# Patient Record
Sex: Female | Born: 1975
Health system: Southern US, Community
[De-identification: ages and names within clinical notes are randomized; demographics above are authoritative.]

## PROBLEM LIST (undated history)

## (undated) ENCOUNTER — Emergency Department (HOSPITAL_COMMUNITY): Admission: EM | Discharge status: Absent without leave | Payer: Medicare Other

## (undated) DIAGNOSIS — F1410 Cocaine abuse, uncomplicated: Secondary | ICD-10-CM

## (undated) DIAGNOSIS — F329 Major depressive disorder, single episode, unspecified: Secondary | ICD-10-CM

## (undated) DIAGNOSIS — A64 Unspecified sexually transmitted disease: Secondary | ICD-10-CM

## (undated) DIAGNOSIS — F1910 Other psychoactive substance abuse, uncomplicated: Secondary | ICD-10-CM

## (undated) DIAGNOSIS — R569 Unspecified convulsions: Secondary | ICD-10-CM

## (undated) DIAGNOSIS — F32A Depression, unspecified: Secondary | ICD-10-CM

## (undated) HISTORY — DX: Major depressive disorder, single episode, unspecified: F32.9

## (undated) HISTORY — DX: Depression, unspecified: F32.A

## (undated) HISTORY — DX: Other psychoactive substance abuse, uncomplicated: F19.10

## (undated) HISTORY — PX: OTHER SURGICAL HISTORY: SHX169

---

## 1999-02-14 ENCOUNTER — Encounter: Payer: Self-pay | Admitting: Thoracic Surgery (Cardiothoracic Vascular Surgery)

## 1999-02-14 ENCOUNTER — Encounter: Payer: Self-pay | Admitting: Emergency Medicine

## 1999-02-14 ENCOUNTER — Inpatient Hospital Stay (HOSPITAL_COMMUNITY): Admission: EM | Admit: 1999-02-14 | Discharge: 1999-02-18 | Payer: Self-pay | Admitting: Emergency Medicine

## 1999-02-15 ENCOUNTER — Encounter: Payer: Self-pay | Admitting: Thoracic Surgery (Cardiothoracic Vascular Surgery)

## 1999-02-16 ENCOUNTER — Encounter: Payer: Self-pay | Admitting: Thoracic Surgery (Cardiothoracic Vascular Surgery)

## 1999-02-17 ENCOUNTER — Encounter: Payer: Self-pay | Admitting: Thoracic Surgery (Cardiothoracic Vascular Surgery)

## 1999-03-15 ENCOUNTER — Other Ambulatory Visit: Admission: RE | Admit: 1999-03-15 | Discharge: 1999-03-15 | Payer: Self-pay | Admitting: Obstetrics and Gynecology

## 1999-04-12 ENCOUNTER — Encounter: Payer: Self-pay | Admitting: Thoracic Surgery (Cardiothoracic Vascular Surgery)

## 1999-04-12 ENCOUNTER — Encounter
Admission: RE | Admit: 1999-04-12 | Discharge: 1999-04-12 | Payer: Self-pay | Admitting: Thoracic Surgery (Cardiothoracic Vascular Surgery)

## 2000-11-24 ENCOUNTER — Emergency Department (HOSPITAL_COMMUNITY): Admission: EM | Admit: 2000-11-24 | Discharge: 2000-11-24 | Payer: Self-pay | Admitting: Emergency Medicine

## 2001-01-07 ENCOUNTER — Emergency Department (HOSPITAL_COMMUNITY): Admission: EM | Admit: 2001-01-07 | Discharge: 2001-01-07 | Payer: Self-pay | Admitting: Emergency Medicine

## 2001-01-07 ENCOUNTER — Encounter: Payer: Self-pay | Admitting: Emergency Medicine

## 2001-07-09 ENCOUNTER — Inpatient Hospital Stay (HOSPITAL_COMMUNITY): Admission: EM | Admit: 2001-07-09 | Discharge: 2001-07-15 | Payer: Self-pay

## 2001-07-13 ENCOUNTER — Encounter: Payer: Self-pay | Admitting: Family Medicine

## 2001-07-14 ENCOUNTER — Encounter: Payer: Self-pay | Admitting: Family Medicine

## 2001-07-22 ENCOUNTER — Encounter: Admission: RE | Admit: 2001-07-22 | Discharge: 2001-07-22 | Payer: Self-pay | Admitting: Family Medicine

## 2002-07-19 ENCOUNTER — Emergency Department (HOSPITAL_COMMUNITY): Admission: EM | Admit: 2002-07-19 | Discharge: 2002-07-20 | Payer: Self-pay | Admitting: Emergency Medicine

## 2002-07-20 ENCOUNTER — Encounter: Payer: Self-pay | Admitting: Emergency Medicine

## 2002-08-03 ENCOUNTER — Other Ambulatory Visit: Admission: RE | Admit: 2002-08-03 | Discharge: 2002-08-03 | Payer: Self-pay | Admitting: Obstetrics and Gynecology

## 2004-02-07 ENCOUNTER — Other Ambulatory Visit: Admission: RE | Admit: 2004-02-07 | Discharge: 2004-02-07 | Payer: Self-pay | Admitting: Obstetrics and Gynecology

## 2004-03-12 ENCOUNTER — Emergency Department (HOSPITAL_COMMUNITY): Admission: EM | Admit: 2004-03-12 | Discharge: 2004-03-13 | Payer: Self-pay | Admitting: Emergency Medicine

## 2005-06-20 ENCOUNTER — Inpatient Hospital Stay (HOSPITAL_COMMUNITY): Admission: AD | Admit: 2005-06-20 | Discharge: 2005-06-22 | Payer: Self-pay | Admitting: Obstetrics & Gynecology

## 2006-07-08 ENCOUNTER — Ambulatory Visit: Payer: Self-pay | Admitting: *Deleted

## 2006-07-09 ENCOUNTER — Ambulatory Visit (HOSPITAL_COMMUNITY): Admission: RE | Admit: 2006-07-09 | Discharge: 2006-07-09 | Payer: Self-pay | Admitting: Sports Medicine

## 2006-07-13 ENCOUNTER — Inpatient Hospital Stay (HOSPITAL_COMMUNITY): Admission: AD | Admit: 2006-07-13 | Discharge: 2006-07-14 | Payer: Self-pay | Admitting: Gynecology

## 2006-07-13 ENCOUNTER — Ambulatory Visit: Payer: Self-pay | Admitting: *Deleted

## 2008-07-17 ENCOUNTER — Inpatient Hospital Stay (HOSPITAL_COMMUNITY): Admission: AD | Admit: 2008-07-17 | Discharge: 2008-07-18 | Payer: Self-pay | Admitting: Obstetrics & Gynecology

## 2008-07-17 ENCOUNTER — Encounter (HOSPITAL_COMMUNITY): Payer: Self-pay | Admitting: Obstetrics and Gynecology

## 2008-07-17 ENCOUNTER — Ambulatory Visit: Payer: Self-pay | Admitting: Obstetrics and Gynecology

## 2010-10-07 LAB — DIFFERENTIAL
Eosinophils Absolute: 0 10*3/uL (ref 0.0–0.7)
Eosinophils Relative: 0 % (ref 0–5)
Lymphocytes Relative: 10 % — ABNORMAL LOW (ref 12–46)
Lymphs Abs: 1.3 10*3/uL (ref 0.7–4.0)
Monocytes Absolute: 0.6 10*3/uL (ref 0.1–1.0)

## 2010-10-07 LAB — RAPID URINE DRUG SCREEN, HOSP PERFORMED
Amphetamines: NOT DETECTED
Barbiturates: NOT DETECTED
Benzodiazepines: NOT DETECTED
Cocaine: POSITIVE — AB
Opiates: NOT DETECTED
Tetrahydrocannabinol: NOT DETECTED

## 2010-10-07 LAB — CBC
HCT: 34.5 % — ABNORMAL LOW (ref 36.0–46.0)
Hemoglobin: 11.6 g/dL — ABNORMAL LOW (ref 12.0–15.0)
MCHC: 33.5 g/dL (ref 30.0–36.0)
MCV: 86.5 fL (ref 78.0–100.0)
Platelets: 337 10*3/uL (ref 150–400)
RBC: 3.99 MIL/uL (ref 3.87–5.11)
RDW: 12.9 % (ref 11.5–15.5)
WBC: 12.6 10*3/uL — ABNORMAL HIGH (ref 4.0–10.5)

## 2010-10-07 LAB — GC/CHLAMYDIA PROBE AMP, URINE
Chlamydia, Swab/Urine, PCR: POSITIVE — AB
GC Probe Amp, Urine: POSITIVE — AB

## 2010-10-07 LAB — TYPE AND SCREEN: ABO/RH(D): O POS

## 2010-10-07 LAB — RUBELLA SCREEN: Rubella: 105.2 IU/mL — ABNORMAL HIGH

## 2010-11-08 NOTE — Discharge Summary (Signed)
[place]. [facility]  Patient:    Deanna Klein, KOCI Visit Number: [id number] MRN: [id number]          Service Type: MED Location: [phone] Attending Physician:  Sanjuana Letters Dictated by:   Arlis Porta, M.D. Admit Date:  07/09/2001 Discharge Date: 07/15/2001   CC:         [name] A. Dub Mikes, M.D.  Dr. Roxan Hockey   Discharge Summary  DISCHARGE DIAGNOSES: 1. Right upper lobe pneumonia. 2. Crack abuse. 3. History of depression. 4. Chlamydia.  CONSULTANTS:  Dr. Roxan Hockey from infectious disease.  PROCEDURES/PERTINENT LABORATORY DATA:  1. CT scan of the chest showed severe emphysematous disease involving both     upper lobes, right greater than left, with extensive pneumonia involving     the right upper lobe with some air bronchograms and possibly some areas of     necrosis or cavitation.  This could also represent blebs that are spared     from the infection.  2. Hepatic panel showed SGOT of 22, SGPT of 16, alkaline phosphatase of 61,     total bilirubin of 0.4, total protein 5.7, and an albumin of 2.4.  3. BMET at discharge showed a sodium of 139, potassium 4.7, chloride 107, CO2     28, glucose 126, BUN 3, creatinine 0.6, and a calcium of 8.5.  4. Urine GC probe was negative.  CT urine probe was positive.  5. Sputum AFB smears times two were negative.  Cultures are pending for six     weeks.  6. CBC at discharge showed white blood cell count of 15.4, hemoglobin 12.6,     hematocrit 37.4, MCV 85.7, RDW 13.0, and platelet count of 315.  7. RPR was nonreactive.  8. Blood cultures showed no growth.  9. HIV antibody was negative. 10. Urine drug screen showed positive for crack cocaine. 11. Urinalysis showed 40 ketones, small bilirubin, trace blood, 30 protein,     but negative nitrites and moderate leukocytes.  There were many squamous     epithelial cells 7-10 white blood cells, 3-6 red blood cells, with     Trichomonas present as  well.  HOSPITAL COURSE: #1 - PNEUMONIA:  This is a 35 year old white female with a history of crack cocaine abuse as well as a one-pack-year history of smoking times ten years who presented with fevers, chills, and coughing for the past week prior to admission.  She was found to have a right upper lobe infiltrate on chest x-ray.  Given the nature of the location and her risk factors of homelessness and drug abuse, she was admitted.  She was started on IV Rocephin and clindamycin.  TB was ruled out with a negative PPD as well as two negative acid fast bacilli sputum smears.  She was kept on respiratory isolation until TB was ruled out.  Clindamycin was stopped secondary to nausea and vomiting.  She was switched from Rocephin to amoxicillin p.o. 875 mg t.i.d.  She received a total of six days of antibiotics in the hospital and should continue for a full 14-day course.  Doxycycline was later added to double cover for Chlamydia as well as for her pneumonia.  The patient was counseled on smoking cessation as well as cessation of her drug use.  She was given a flu vaccine and a pneumovax vaccine prior to discharge.  At discharge, her lungs sounded clear with good oxygen saturations throughout her hospitalization.  She never required oxygen. Her  fever curve trended downward from a max of 102.6 degrees at admission to a level of 98.8 degrees at discharge.  #2 - CHLAMYDIA:  The patient was double-covered with doxycycline 100 mg p.o. b.i.d. to cover for her Chlamydia as well as for her pneumonia.  Other sexually transmitted diseases were ruled out.  She is negative for HIV.  She is negative for syphilis.  A hepatitis panel, however, was never drawn.  #3 - POSITIVE TRICHOMONAS:  The patient was given a one dose of Flagyl to cover for this.  She was ruled out for other STDs as stated above.  #4 - DEPRESSION:  The patient is currently not on any medications.  She was set up an appointment to see  Dr. Dub Mikes on August 02, 2001 at 2 p.m.  The patient refused any treatment for her drug abuse at this time.  #5 - DRUG ABUSE:  The patient does not want ADS counseling at this time.  She showed no signs of withdrawal during her stay except for possible excessive fatigue.  She should follow up with Dr. Dub Mikes as above.  The patient was counseled on tobacco abuse cessation.  #6 - SOCIAL ISSUES:  The patient was kicked-out of her moms house secondary to using drugs.  However, the mother is a amenable to taking the patient back in at discharge.  DISCHARGE MEDICATIONS: 1. Amoxicillin 875 mg p.o. t.i.d. times seven days. 2. Doxycycline 100 mg p.o. b.i.d. times seven days. 3. Multivitamin one tablet p.o. q.d.  DISCHARGE ACTIVITY:  As tolerated.  DISCHARGE DIET:  No restrictions.  WOUND CARE:  Not applicable.  SPECIAL INSTRUCTIONS:  She is to return to the hospital for worsening shortness of breath, chest pain, fevers, or worsening cough.  FOLLOW-UP APPOINTMENT:  She is to see Dr. Dub Mikes on August 02, 2001 at 2 p.m. She is to call [phone] to make an appointment to see Dr. Renold Don at the [facility] in two weeks. Dictated by:   Arlis Porta, M.D. Attending Physician:  Sanjuana Letters DD:  07/15/01 TD:  07/16/01 Job: [id number]

## 2010-11-08 NOTE — H&P (Signed)
[place]. [facility]  Patient:    CARESSE, SEDIVY Visit Number: [id number] MRN: [id number]          Service Type: MED Location: [phone] Attending Physician:  Sanjuana Letters Dictated by:   Arlis Porta, M.D. Admit Date:  07/09/2001                           History and Physical  CHIEF COMPLAINT: "Sick, aches, pains, cough."  HISTORY OF PRESENT ILLNESS: This is a 35 year old white female, with the only past medical history pneumothorax one to two years ago, who presented to the [facility] Emergency Department with the above complaints lasting for the last week.  She states that her cough has been nonproductive. She denies hemoptysis.  She complains of chest congestion but denies any wheezing or shortness of breath.  She complains of myalgias diffusely.  She denies any pain with inspirations.  Denies chest pain.  She did have some nausea and vomiting yesterday as well as some diarrhea.  She does have sick contacts where she works in [employer].  She has unknown exposure to TB.  REVIEW OF SYSTEMS: She denies any dysuria or hematuria.  She denies any abdominal pain.  She denies any melena or hematochezia.  She does state that she feels like she has lost some weight recently.  PAST MEDICAL HISTORY:  1. Pneumothorax one to two years ago.  2. History of depression, on SSRIs but not currently.  PAST SURGICAL HISTORY: She denies any surgeries.  MEDICATIONS: None.  ALLERGIES (cause rash):  1. DARVOCET.  2. PERCOCET.  SOCIAL HISTORY: She was recently kicked out of her Moms house because she recently relapsed and used crack cocaine, as recently as the day before admission.  She is now currently living in her car.  She denies any alcohol use, past or presently.  She does have tobacco abuse of one pack per day x10 years.  She denies any other drug use.  She works at the [employer].  She does admit to unprotected sex in  the past.  FAMILY HISTORY: Her maternal grandmother died secondary to cancer of unknown origin.  Her Mom and Dad are apparently healthy.  He does have a half-sister that passed away secondary to muscular dystrophy.  The patient has no kids.  PHYSICAL EXAMINATION:  VITAL SIGNS: Temperature 99.1 degrees, respiratory rate 20, pulse 107, blood pressure 93/58.  O2 saturation 96% on room air.  GENERAL: She was alert and oriented x3.  Thin white female in no acute distress.  There was no obviously increased work of breathing.  HEENT: PERRL.  Tympanic membranes clear bilaterally.  Oropharynx without erythema or exudate.  Moist mucous membranes.  NECK: She has small lymphadenopathy of her left posterior cervical chain.  LUNGS: Clear to auscultation bilaterally with decreased breath sounds at the right upper lobe.  Otherwise, good air movement.  CARDIOVASCULAR: Tachycardic, with regular rhythm.  No murmurs.  ABDOMEN: Thin, nontender.  Normoactive bowel sounds.  EXTREMITIES: No edema.  NEUROLOGIC: Good movement of all four extremities without any gross abnormalities.  LABORATORY DATA: CBC showed a WBC of 19.5, hemoglobin 13.7, hematocrit 40.0; platelet count 305,000; 81% neutrophils, 12% lymphocytes, 7% monocytes, 0% eosinophils, 0% basophils.  Complete metabolic panel showed a sodium of 135, potassium 3.5, chloride 100, CO2 26, BUN 5, creatinine 0.7, glucose 102, calcium 8.8, AST 13, ALT 8, alkaline phosphatase 62, total bilirubin  0.4, total protein 7.1, albumin 3.0.  Chest x-ray showed right upper lobe air space disease consistent with pneumonia.  Urine drug screen pending.  Urinalysis pending.  HIV screening test pending as well.  ASSESSMENT/PLAN: This is a 35 year old white female, with right upper lobe pneumonia.  1. Pneumonia.  Given the patients pneumonia in the right upper lobe in an     otherwise healthy female, but with risk factors for tuberculosis and     human  immunodeficiency virus, we must considered Pneumocystis carinii     and tuberculosis along with the usual bugs of Streptococcus pneumoniae,     Mycoplasma, and Haemophilus influenzae.  We will admit her to a negative     pressure respiratory isolation room.  We will place a purified protein     derivative.  We will also check sputum cultures x3 for acid-fast bacilli.     We will check her for human immunodeficiency virus as well.  We will treat     her empirically for now with ceftriaxone and azithromycin.  We will watch     her oxygen saturations and give her oxygen as needed.  Currently she is     saturating well.  2. Social.  Given the patients current situation, being kicked out of her     Moms house, we will have social work consulted to give her     recommendations for homeless shelters.  Given her history of drug abuse     she may help with this issue as well.  The patient also needs a primary     medical doctor.  3. Fluids, electrolytes, and nutrition.  The patient is tolerating p.o.s     currently so we will hold off on intravenous fluids at this point.  We     will watch her electrolytes during her admission and replace as needed. Dictated by:   Arlis Porta, M.D. Attending Physician:  Sanjuana Letters DD:  07/09/01 TD:  07/10/01 Job: [phone]

## 2011-03-31 ENCOUNTER — Emergency Department (HOSPITAL_COMMUNITY): Payer: Medicare Other

## 2011-03-31 ENCOUNTER — Emergency Department (HOSPITAL_COMMUNITY)
Admission: EM | Admit: 2011-03-31 | Discharge: 2011-04-01 | Disposition: A | Discharge status: Home / Self Care | Payer: Medicare Other | Attending: Emergency Medicine | Admitting: Emergency Medicine

## 2011-03-31 DIAGNOSIS — R197 Diarrhea, unspecified: Secondary | ICD-10-CM | POA: Insufficient documentation

## 2011-03-31 DIAGNOSIS — R110 Nausea: Secondary | ICD-10-CM | POA: Insufficient documentation

## 2011-03-31 DIAGNOSIS — O200 Threatened abortion: Secondary | ICD-10-CM | POA: Insufficient documentation

## 2011-03-31 DIAGNOSIS — R10813 Right lower quadrant abdominal tenderness: Secondary | ICD-10-CM | POA: Insufficient documentation

## 2011-03-31 DIAGNOSIS — G40909 Epilepsy, unspecified, not intractable, without status epilepticus: Secondary | ICD-10-CM | POA: Insufficient documentation

## 2011-03-31 DIAGNOSIS — Z79899 Other long term (current) drug therapy: Secondary | ICD-10-CM | POA: Insufficient documentation

## 2011-03-31 DIAGNOSIS — N898 Other specified noninflammatory disorders of vagina: Secondary | ICD-10-CM | POA: Insufficient documentation

## 2011-03-31 DIAGNOSIS — R1031 Right lower quadrant pain: Secondary | ICD-10-CM | POA: Insufficient documentation

## 2011-03-31 DIAGNOSIS — N949 Unspecified condition associated with female genital organs and menstrual cycle: Secondary | ICD-10-CM | POA: Insufficient documentation

## 2011-03-31 LAB — URINE MICROSCOPIC-ADD ON

## 2011-03-31 LAB — URINALYSIS, ROUTINE W REFLEX MICROSCOPIC
Bilirubin Urine: NEGATIVE
Ketones, ur: 15 mg/dL — AB
Nitrite: NEGATIVE
Protein, ur: NEGATIVE mg/dL
Urobilinogen, UA: 1 mg/dL (ref 0.0–1.0)

## 2011-03-31 LAB — POCT I-STAT TROPONIN I: Troponin i, poc: 0 ng/mL (ref 0.00–0.08)

## 2011-03-31 LAB — HCG, QUANTITATIVE, PREGNANCY: hCG, Beta Chain, Quant, S: 1005 m[IU]/mL — ABNORMAL HIGH (ref <5)

## 2011-03-31 LAB — POCT PREGNANCY, URINE: Preg Test, Ur: POSITIVE

## 2011-04-01 LAB — POCT I-STAT, CHEM 8
Creatinine, Ser: 0.8 mg/dL (ref 0.50–1.10)
HCT: 33 % — ABNORMAL LOW (ref 36.0–46.0)
Hemoglobin: 11.2 g/dL — ABNORMAL LOW (ref 12.0–15.0)
Sodium: 140 mEq/L (ref 135–145)
TCO2: 24 mmol/L (ref 0–100)

## 2011-04-01 LAB — WET PREP, GENITAL: Trich, Wet Prep: NONE SEEN

## 2011-04-02 LAB — GC/CHLAMYDIA PROBE AMP, GENITAL: GC Probe Amp, Genital: NEGATIVE

## 2011-05-30 ENCOUNTER — Other Ambulatory Visit (HOSPITAL_COMMUNITY)
Admission: RE | Admit: 2011-05-30 | Discharge: 2011-05-30 | Disposition: A | Discharge status: Home / Self Care | Payer: Medicare Other | Source: Ambulatory Visit | Attending: Obstetrics & Gynecology | Admitting: Obstetrics & Gynecology

## 2011-05-30 ENCOUNTER — Ambulatory Visit (INDEPENDENT_AMBULATORY_CARE_PROVIDER_SITE_OTHER): Payer: Medicaid Other | Admitting: Obstetrics & Gynecology

## 2011-05-30 ENCOUNTER — Other Ambulatory Visit: Payer: Self-pay | Admitting: Obstetrics & Gynecology

## 2011-05-30 VITALS — BP 97/70 | HR 81 | Temp 98.6°F | Ht 63.0 in | Wt 114.4 lb

## 2011-05-30 DIAGNOSIS — Z23 Encounter for immunization: Secondary | ICD-10-CM

## 2011-05-30 DIAGNOSIS — N83209 Unspecified ovarian cyst, unspecified side: Secondary | ICD-10-CM

## 2011-05-30 DIAGNOSIS — Z01419 Encounter for gynecological examination (general) (routine) without abnormal findings: Secondary | ICD-10-CM | POA: Insufficient documentation

## 2011-05-30 DIAGNOSIS — Z113 Encounter for screening for infections with a predominantly sexual mode of transmission: Secondary | ICD-10-CM | POA: Insufficient documentation

## 2011-05-30 MED ORDER — INFLUENZA VIRUS VACC SPLIT PF IM SUSP
0.5000 mL | Freq: Once | INTRAMUSCULAR | Status: AC
  Administered 2011-05-30: 0.5 mL via INTRAMUSCULAR

## 2011-05-30 NOTE — Patient Instructions (Signed)
Sterilization, Women Sterilization is a surgical procedure. This surgery permanently prevents pregnancy in women. This can be done by tying (with or without cutting) the fallopian tubes or burning the tubes closed (tubal ligation). Tubal ligation blocks the tubes and prevents the egg from being fertilized by the sperm. Sterilization can be done by removing the ovaries that produce the egg (castration) as well. Sterilization is considered safe with very rare complications. It does not affect menstrual periods, sexual desire, or performance.  Since sterilization is considered permanent, you should not do it until you are sure you do not want to have more children. You and your partner should fully agree to have the procedure. Your decision to have the procedure should not be made when you are in a stressful situation. This can include a loss of a pregnancy, illness or death of a spouse, or divorce. There are other means of preventing unwanted pregnancies that can be used until you are completely sure you want to be sterilized. Sterilization does not protect against sexually transmitted disease. Women who had a sterilization procedure and want it reversed must know that it requires an expensive and major operation. The reversal may not be successful and has a high rate of tubal (ectopic) pregnancy that can be dangerous and require surgery. There are several ways to perform a tubal sterlization:  Laparoscopy. The abdomen is filled with a gas to see the pelvic organs. Then, a tube with a light attached is inserted into the abdomen through 2 small incisions. The fallopian tubes are blocked with a ring, clip or electrocautery to burn closed the tubes. Then, the gas is released and the small incisions are closed.   Hysteroscopy. A tube with a light is inserted in the vagina, through the cervix and then into the uterus. A spring-like instrument is inserted into the opening of the fallopian tubes. The spring causes  scaring and blocks the tubes. Other forms of contraception should be used for three months at which time an X-ray is done to be sure the tubes are blocked.   Minilaparotomy. This is done right after giving birth. A small incision is made under the belly button and the tubes are exposed. The tubes can then be burned, tied and/or cut.   Tubal ligation can be done during a Cesarean section.   Castration is a surgical procedure that removes both ovaries.  Tubal sterilization should be discussed with your caregiver to answer any concerns you or your partner might have. This meeting will help to decide for sure if the operation is safe for you and which procedure is the best one for you. You can change your mind and cancel the surgery at any time. HOME CARE INSTRUCTIONS   Follow your caregivers instructions regarding diet, rest, work, social and sexual activities and follow up appointments.   Shoulder pain is common following a laparoscopy. The pain may be relieved by lying down flat.   Only take over-the-counter or prescription medicines for pain, discomfort or fever as directed by your caregiver.   You may use lozenges for throat discomfort.   Keep the incisions covered to prevent infection.  SEEK IMMEDIATE MEDICAL CARE IF:   You develop a temperature of 102 F (38.9 C), or as your caregiver suggests.   You become dizzy or faint.   You start to feel sick to your stomach (nausea) or throw up (vomit).   You develop abdominal pain not relieved with over-the-counter medications.   You have redness and puffiness (  swelling) of the cut (incision).   You see pus draining from the incision.   You miss a menstrual period.  Document Released: 11/26/2007 Document Revised: 02/19/2011 Document Reviewed: 11/26/2007 Exit[employer] Patient Information 2012 [employer]. 

## 2011-05-30 NOTE — Progress Notes (Signed)
  Subjective:    Patient ID: Deanna Klein, female    DOB: 28-May-1976, 35 y.o.   MRN: [id number]  [id number] LMP 05/10/11 Ms. Climer was seen at [facility] emergency room on 04/01/2011. She had vaginal bleeding and a positive pregnancy test with a beta hCG 1000. Since then she had heavy bleeding consistent with a miscarriage. She saw Dr. Richardson Dopp 2 weeks later. Ultrasound showed complete resolution of the miscarriage. Patient states that a right ovarian cyst was seen. [facility] does not accept Medicaid. She was referred here for followup of the cyst. I don't have a copy of that record. No complaints of pain. She requests sterilization. Currently she uses condoms for birth control. She has had 3 children and they are all adopted out she says. Past Medical History  Diagnosis Date  . Depression   . Substance abuse     crack cocaine   . Past Surgical History  Procedure Date  . Right lung collapse    Scheduled Meds:   Continuous Infusions:   PRN Meds:.   Current outpatient prescriptions:ARIPiprazole (ABILIFY) 10 MG tablet, Take 10 mg by mouth daily.  , Disp: , Rfl: ;  divalproex (DEPAKOTE) 500 MG DR tablet, Take 500 mg by mouth daily.  , Disp: , Rfl:  Allergies  Allergen Reactions  . Darvocet (Propoxyphene N-Acetaminophen) Hives  . Percocet (Oxycodone-Acetaminophen) Hives   History   Social History  . Marital Status: Single    Spouse Name: N/A    Number of Children: N/A  . Years of Education: N/A   Occupational History  . Not on file.   Social History Main Topics  . Smoking status: Current Everyday Smoker -- 1.0 packs/day for 19 years    Types: Cigarettes  . Smokeless tobacco: Never Used  . Alcohol Use: No  . Drug Use: Yes    Special: "Crack" cocaine     crack-couple months sober  . Sexually Active: Not Currently -- Female partner(s)    Birth Control/ Protection: None   Other Topics Concern  . Not on file   Social History Narrative  . No narrative on file   History  reviewed. No pertinent family history.     Review of Systems no vaginal bleeding. No significant vaginal discharge or abdominal pain.     Objective:   Physical Exam  Filed Vitals:   05/30/11 1059  Height: 5\' 3"  (1.6 m)  Weight: 114 lb 6.4 oz (51.891 kg)   Filed Vitals:   05/30/11 1059  BP: 97/70  Pulse: 81  Temp: 98.6 F (37 C)  TempSrc: Oral  Height: 5\' 3"  (1.6 m)  Weight: 114 lb 6.4 oz (51.891 kg)   Patient is in no acute distress. She has a flat affect  Abdomen: Nondistended flat soft nontender no mass Pelvic: External genitalia vagina and cervix appeared normal with scant mucous discharge. A Pap smear was obtained. STD testing was ordered. Uterus is normal size nontender no adnexal masses or tenderness.      Assessment & Plan:  Recent miscarriage currently resolved. Patient states that an ovarian cyst was seen on ultrasound in October. She requests permanent sterilization. I will order a pelvic ultrasound in followup for the ovarian cyst. She'll return in 2 weeks. She did sign Medicaid papers for sterilization. She can be scheduled for laparoscopic sterilization when she returns. Dr. Scheryl Darter 05/30/2011

## 2011-05-30 NOTE — Progress Notes (Signed)
Pt does not remember when her last period.

## 2011-06-04 ENCOUNTER — Ambulatory Visit (HOSPITAL_COMMUNITY): Payer: Medicaid Other

## 2011-06-05 ENCOUNTER — Ambulatory Visit (HOSPITAL_COMMUNITY)
Admission: RE | Admit: 2011-06-05 | Discharge: 2011-06-05 | Disposition: A | Discharge status: Home / Self Care | Payer: Medicare Other | Source: Ambulatory Visit | Attending: Obstetrics & Gynecology | Admitting: Obstetrics & Gynecology

## 2011-06-05 DIAGNOSIS — N83209 Unspecified ovarian cyst, unspecified side: Secondary | ICD-10-CM | POA: Insufficient documentation

## 2013-03-06 ENCOUNTER — Encounter (HOSPITAL_COMMUNITY): Payer: Self-pay | Admitting: Emergency Medicine

## 2013-03-06 ENCOUNTER — Emergency Department (HOSPITAL_COMMUNITY): Payer: Medicare Other

## 2013-03-06 ENCOUNTER — Inpatient Hospital Stay (HOSPITAL_COMMUNITY)
Admission: EM | Admit: 2013-03-06 | Discharge: 2013-03-10 | DRG: 199 | Disposition: A | Discharge status: Home / Self Care | Payer: Medicare Other | Attending: Internal Medicine | Admitting: Internal Medicine

## 2013-03-06 DIAGNOSIS — F3289 Other specified depressive episodes: Secondary | ICD-10-CM | POA: Diagnosis present

## 2013-03-06 DIAGNOSIS — Z681 Body mass index (BMI) 19 or less, adult: Secondary | ICD-10-CM

## 2013-03-06 DIAGNOSIS — E43 Unspecified severe protein-calorie malnutrition: Secondary | ICD-10-CM | POA: Insufficient documentation

## 2013-03-06 DIAGNOSIS — F17200 Nicotine dependence, unspecified, uncomplicated: Secondary | ICD-10-CM | POA: Diagnosis present

## 2013-03-06 DIAGNOSIS — Z79899 Other long term (current) drug therapy: Secondary | ICD-10-CM

## 2013-03-06 DIAGNOSIS — J939 Pneumothorax, unspecified: Secondary | ICD-10-CM | POA: Diagnosis present

## 2013-03-06 DIAGNOSIS — R7309 Other abnormal glucose: Secondary | ICD-10-CM | POA: Diagnosis not present

## 2013-03-06 DIAGNOSIS — IMO0001 Reserved for inherently not codable concepts without codable children: Secondary | ICD-10-CM | POA: Diagnosis present

## 2013-03-06 DIAGNOSIS — F329 Major depressive disorder, single episode, unspecified: Secondary | ICD-10-CM | POA: Diagnosis present

## 2013-03-06 DIAGNOSIS — J9383 Other pneumothorax: Principal | ICD-10-CM | POA: Diagnosis present

## 2013-03-06 DIAGNOSIS — J930 Spontaneous tension pneumothorax: Secondary | ICD-10-CM

## 2013-03-06 DIAGNOSIS — F1410 Cocaine abuse, uncomplicated: Secondary | ICD-10-CM | POA: Diagnosis present

## 2013-03-06 LAB — PRO B NATRIURETIC PEPTIDE: Pro B Natriuretic peptide (BNP): 63.7 pg/mL (ref 0–125)

## 2013-03-06 LAB — CBC
MCH: 30.8 pg (ref 26.0–34.0)
MCHC: 35.5 g/dL (ref 30.0–36.0)
MCV: 87 fL (ref 78.0–100.0)
Platelets: 253 10*3/uL (ref 150–400)
RBC: 5.22 MIL/uL — ABNORMAL HIGH (ref 3.87–5.11)

## 2013-03-06 LAB — BASIC METABOLIC PANEL
CO2: 20 mEq/L (ref 19–32)
Calcium: 9.8 mg/dL (ref 8.4–10.5)
Creatinine, Ser: 0.86 mg/dL (ref 0.50–1.10)
GFR calc non Af Amer: 84 mL/min — ABNORMAL LOW (ref >90)

## 2013-03-06 MED ORDER — NICOTINE 14 MG/24HR TD PT24
14.0000 mg | MEDICATED_PATCH | Freq: Every day | TRANSDERMAL | Status: DC
  Administered 2013-03-07 – 2013-03-10 (×5): 14 mg via TRANSDERMAL
  Filled 2013-03-06 (×8): qty 1

## 2013-03-06 MED ORDER — SODIUM CHLORIDE 0.9 % IJ SOLN
3.0000 mL | Freq: Two times a day (BID) | INTRAMUSCULAR | Status: DC
  Administered 2013-03-08 – 2013-03-10 (×5): 3 mL via INTRAVENOUS

## 2013-03-06 MED ORDER — MIDAZOLAM HCL 2 MG/2ML IJ SOLN
INTRAMUSCULAR | Status: AC
  Filled 2013-03-06: qty 4

## 2013-03-06 MED ORDER — ARIPIPRAZOLE 10 MG PO TABS
10.0000 mg | ORAL_TABLET | Freq: Every day | ORAL | Status: DC
  Administered 2013-03-06 – 2013-03-09 (×4): 10 mg via ORAL
  Filled 2013-03-06 (×7): qty 1

## 2013-03-06 MED ORDER — FENTANYL CITRATE 0.05 MG/ML IJ SOLN
50.0000 ug | Freq: Once | INTRAMUSCULAR | Status: AC
  Administered 2013-03-06: 50 ug via INTRAVENOUS

## 2013-03-06 MED ORDER — PNEUMOCOCCAL VAC POLYVALENT 25 MCG/0.5ML IJ INJ
0.5000 mL | INJECTION | INTRAMUSCULAR | Status: AC
  Filled 2013-03-06 (×2): qty 0.5

## 2013-03-06 MED ORDER — LIDOCAINE 5 % EX PTCH
2.0000 | MEDICATED_PATCH | CUTANEOUS | Status: DC
  Administered 2013-03-07 – 2013-03-09 (×6): 2 via TRANSDERMAL
  Filled 2013-03-06 (×7): qty 2

## 2013-03-06 MED ORDER — SODIUM CHLORIDE 0.9 % IV SOLN
INTRAVENOUS | Status: DC
  Administered 2013-03-06: 22:00:00 via INTRAVENOUS
  Administered 2013-03-07: 50 mL/h via INTRAVENOUS

## 2013-03-06 MED ORDER — ONDANSETRON HCL 4 MG/2ML IJ SOLN: 4.0000 mg | Freq: Four times a day (QID) | INTRAMUSCULAR | Status: DC | PRN

## 2013-03-06 MED ORDER — FENTANYL CITRATE 0.05 MG/ML IJ SOLN
50.0000 ug | Freq: Once | INTRAMUSCULAR | Status: AC
  Administered 2013-03-06: 50 ug via INTRAVENOUS
  Filled 2013-03-06: qty 2

## 2013-03-06 MED ORDER — SODIUM CHLORIDE 0.9 % IV BOLUS (SEPSIS)
1000.0000 mL | Freq: Once | INTRAVENOUS | Status: AC
  Administered 2013-03-06: 1000 mL via INTRAVENOUS

## 2013-03-06 MED ORDER — INFLUENZA VAC SPLIT QUAD 0.5 ML IM SUSP
0.5000 mL | INTRAMUSCULAR | Status: AC
  Filled 2013-03-06 (×2): qty 0.5

## 2013-03-06 MED ORDER — ONDANSETRON HCL 4 MG PO TABS: 4.0000 mg | ORAL_TABLET | Freq: Four times a day (QID) | ORAL | Status: DC | PRN

## 2013-03-06 MED ORDER — PANTOPRAZOLE SODIUM 40 MG PO TBEC
40.0000 mg | DELAYED_RELEASE_TABLET | Freq: Every day | ORAL | Status: DC
  Administered 2013-03-06 – 2013-03-10 (×5): 40 mg via ORAL
  Filled 2013-03-06 (×5): qty 1

## 2013-03-06 MED ORDER — HEPARIN SODIUM (PORCINE) 5000 UNIT/ML IJ SOLN
5000.0000 [IU] | Freq: Three times a day (TID) | INTRAMUSCULAR | Status: DC
  Administered 2013-03-06 – 2013-03-10 (×12): 5000 [IU] via SUBCUTANEOUS
  Filled 2013-03-06 (×16): qty 1

## 2013-03-06 MED ORDER — HYDROCOD POLST-CHLORPHEN POLST 10-8 MG/5ML PO LQCR: 5.0000 mL | Freq: Two times a day (BID) | ORAL | Status: DC | PRN

## 2013-03-06 MED ORDER — OXYCODONE HCL 5 MG PO TABS: 5.0000 mg | ORAL_TABLET | ORAL | Status: DC | PRN

## 2013-03-06 MED ORDER — DIVALPROEX SODIUM 500 MG PO DR TAB
500.0000 mg | DELAYED_RELEASE_TABLET | Freq: Every day | ORAL | Status: DC
  Administered 2013-03-06 – 2013-03-10 (×5): 500 mg via ORAL
  Filled 2013-03-06 (×6): qty 1

## 2013-03-06 MED ORDER — MORPHINE SULFATE 2 MG/ML IJ SOLN
2.0000 mg | INTRAMUSCULAR | Status: DC | PRN
  Administered 2013-03-06: 2 mg via INTRAVENOUS
  Filled 2013-03-06: qty 1

## 2013-03-06 MED ORDER — LORAZEPAM 0.5 MG PO TABS: 0.5000 mg | ORAL_TABLET | ORAL | Status: DC | PRN

## 2013-03-06 MED ORDER — HYDROMORPHONE HCL PF 1 MG/ML IJ SOLN
0.5000 mg | INTRAMUSCULAR | Status: DC | PRN
  Administered 2013-03-07 (×3): 1 mg via INTRAVENOUS
  Administered 2013-03-08: 0.5 mg via INTRAVENOUS
  Administered 2013-03-08: 1 mg via INTRAVENOUS
  Administered 2013-03-08: 0.5 mg via INTRAVENOUS
  Filled 2013-03-06 (×7): qty 1

## 2013-03-06 NOTE — ED Notes (Signed)
C/o L shoulder blade pain since last night that radiates to L chest with sob.  Denies nausea and vomiting.  O2 sats 86% on room air and HR 140s.  Reports productive cough with white sputum.

## 2013-03-06 NOTE — H&P (Signed)
[facility] History and Physical  Patient: Deanna Klein  WUJ:[id number]  DOB: [birth year]  DOA: 03/06/2013  Referring physician: Dr. Criss Alvine PCP: No PCP Per Patient  Consults: Treatment Team:  Delight Ovens, MD   Chief Complaint: Left-sided chest pain  HPI: Deanna Klein is a 37 y.o. female with Past medical history of depression and cocaine abuse an active smoker. She presented with the complaint of left-sided chest pain that started yesterday. She mentioned that she was at her house sitting in a chair and then suddenly started having chest pain on the left side which was stabbing in nature and sharp nonradiating. The chest pain eased up and then she went to sleep and when she woke up in the morning she started having continuous chest pain which progressively worsened throughout the day. She does mentions that she has history of right-sided pneumothorax in the past. She denies any fall, trauma, injury. She does she that she has dry cough, but denies any sputum expectoration. She denies any fever but does mention about chills that happened yesterday. She denies any other symptoms of nausea, vomiting, abdominal pain, diarrhea, urinary symptoms, leg swelling.  Review of Systems: as mentioned in the history of present illness.  A Comprehensive review of the other systems is negative.  Past Medical History  Diagnosis Date  . Depression   . Substance abuse     crack cocaine   Past Surgical History  Procedure Laterality Date  . Right lung collapse     Social History:  reports that she has been smoking Cigarettes.  She has a 19 pack-year smoking history. She has never used smokeless tobacco. She reports that she uses illicit drugs ("Crack" cocaine and Cocaine). She reports that she does not drink alcohol. Patient is coming from home. Independent for most of her  ADL.  Allergies  Allergen Reactions  . Darvocet [Propoxyphene-Acetaminophen] Hives  . Percocet  [Oxycodone-Acetaminophen] Hives    No family history on file.  Prior to Admission medications   Medication Sig Start Date End Date Taking? Authorizing Provider  ARIPiprazole (ABILIFY) 10 MG tablet Take 10 mg by mouth at bedtime.    Yes Historical Provider, MD  Ascorbic Acid (VITAMIN C PO) Take 1 tablet by mouth daily.   Yes Historical Provider, MD  divalproex (DEPAKOTE) 500 MG DR tablet Take 500 mg by mouth daily.    Yes Historical Provider, MD  Potassium 99 MG TABS Take 99 mg by mouth daily.   Yes Historical Provider, MD    Physical Exam: Filed Vitals:   03/06/13 1930 03/06/13 1945 03/06/13 2000 03/06/13 2015  BP: 111/74 117/88 112/85 133/83  Pulse: 114 100 103 97  Temp:      TempSrc:      Resp: 21 15 29 23   SpO2: 97% 97% 100% 100%    General: Alert, Awake and Oriented to Time, Place and Person. Appear in marked distress Eyes: PERRL ENT: Oral Mucosa clear dry. Neck: No JVD, no Carotid Bruits  Cardiovascular: S1 and S2 Present, no Murmur, Peripheral Pulses Present Respiratory: Chest tube inserted on the left side, Bilateral Air entry equal and Decreased, Clear to Auscultation, no crepitations felt No Crackles, no wheezes Abdomen: Bowel Sound Present, Soft and Non tender Skin: No Rash Extremities: No Pedal edema, no calf tenderness Neurologic: Grossly Unremarkable.  Labs on Admission:  CBC:  Recent Labs Lab 03/06/13 1911  WBC 11.8*  HGB 16.1*  HCT 45.4  MCV 87.0  PLT 253    CMP  Component Value Date/Time   NA 134* 03/06/2013 1911   K 4.1 03/06/2013 1911   CL 100 03/06/2013 1911   CO2 20 03/06/2013 1911   GLUCOSE 130* 03/06/2013 1911   BUN 12 03/06/2013 1911   CREATININE 0.86 03/06/2013 1911   CALCIUM 9.8 03/06/2013 1911   GFRNONAA 84* 03/06/2013 1911   GFRAA >90 03/06/2013 1911    No results found for this basename: LIPASE, AMYLASE,  in the last 168 hours No results found for this basename: AMMONIA,  in the last 168 hours  Cardiac Enzymes: No results found  for this basename: CKTOTAL, CKMB, CKMBINDEX, TROPONINI,  in the last 168 hours  BNP (last 3 results)  Recent Labs  03/06/13 1911  PROBNP 63.7    Radiological Exams on Admission: Dg Chest Port 1 View  03/06/2013   *RADIOLOGY REPORT*  Clinical Data: Chest tube insertion  PORTABLE CHEST - 1 VIEW  Comparison: Same date.  Findings: There has been interval placement of left-sided chest tube with tip projected over the left upper lobe.  Resolution of previously noted pneumothorax is noted.  Right lung is clear. Scarring is noted in both lower lobes.  Cardiomediastinal silhouette is noted.  Small amount of subcutaneous emphysema is seen over left lateral chest wall noted.  IMPRESSION: Interval placement of left-sided chest tube with resolution of the pneumothorax noted on prior exam.   Original Report Authenticated By: Lupita Raider.,  M.D.   Dg Chest Port 1 View  03/06/2013   CLINICAL DATA:  Cough, shortness of breath and chest pain. Fever and chills.  EXAM: PORTABLE CHEST - 1 VIEW  COMPARISON:  No priors.  FINDINGS: Large left-sided pneumothorax, occupying approximately 50% or more of the left hemithorax volume. At this time, there is no definite left to right shift of the cardiomediastinal structures to strongly suggest a tension pneumothorax. Mild diffuse peribronchial cuffing. No acute consolidative airspace disease. No pleural effusions. Heart size is normal.  IMPRESSION: 1. Large left-sided pneumothorax, as above. No definite signs of tension pneumothorax at this time. 2. Diffuse peribronchial cuffing may suggest an underlying bronchitis. CriticalValue/emergent results were called by telephone at the time of interpretation on 03/06/2013 at 7:50 PMto Dr. Criss Alvine, who verbally acknowledged these results.   Electronically Signed   By: Trudie Reed M.D.   On: 03/06/2013 19:51    Assessment/Plan Principal Problem:   Pneumothorax on left Active Problems:   Cocaine abuse   Smoking   1.  Pneumothorax on left The patient presented with left-sided pleuritic chest pain and underwent an x-ray in the ED which doest show pneumothorax. From her prior history of right-sided pneumothorax she might have spontaneous pneumothorax at present as well.  Chest tube has already been inserted and cardiothoracic surgery has been consulted. At present she has bilateral breath sounds, and repeat x-ray after the chest tube does show significant improvement in the aeration of the lung. At present I would admit her to the step down unit. Cardiovascular surgery will be following the chest tube. Pain control will be provided with Lidoderm patch and IV narcotics. As needed Zofran.  2. History of cocaine abuse Counseling provided to quit. Patient placed on lorazepam as needed for anxiety.  3. History of smoking abuse Nicotine patch applied. Patient counseled about quitting.  DVT Prophylaxis: subcutaneous Heparin Nutrition: As tolerated  Code Status: Full  Disposition: Admitted to inpatient in step-down unit.  Author: Lynden Oxford, MD [facility] Pager: [phone] 03/06/2013, 9:11 PM    If 7PM-7AM,  please contact night-coverage [email or web address] Password [email or web address]

## 2013-03-06 NOTE — Consult Note (Signed)
[address]       [place] [address]             [phone]        Deanna Klein [facility] Medical Record #[id number] Date of Birth: [birth year]  Referring: ER Primary Care: No primary provider on file.  Chief Complaint:    Chief Complaint  Patient presents with  . Chest Pain  . Shortness of Breath    History of Present Illness:     37 yo female with previous history of Rt PTX in 2003. Was using cocaine last pm and developed left shoulder pain which persisted today with left chest pain and sob Presented to ER with respiratory difficulty. ER  Staff placed left chest tube with expansion of lung on follow up chest xray   Current Activity/ Functional Status: Patient is independent with mobility/ambulation, transfers, ADL's, IADL's.   Zubrod Score: At the time of surgery this patient's most appropriate activity status/level should be described as: []  Normal activity, no symptoms [x]  Symptoms, fully ambulatory []  Symptoms, in bed less than or equal to 50% of the time []  Symptoms, in bed greater than 50% of the time but less than 100% []  Bedridden []  Moribund  Past Medical History  Diagnosis Date  . Depression   . Substance abuse     crack cocaine    Past Surgical History  Procedure Laterality Date  . Right lung collapse  2003    History  Smoking status  . Current Every Day Smoker -- 1.00 packs/day for 19 years  . Types: Cigarettes  Smokeless tobacco  . Never Used   History  Alcohol Use No    History   Social History  . Marital Status: Single    Spouse Name: N/A    Number of Children: N/A  . Years of Education: N/A   Occupational History  . Not on file.   Social History Main Topics  . Smoking status: Current Every Day Smoker -- 1.00 packs/day for 19 years    Types: Cigarettes  . Smokeless tobacco: Never Used  . Alcohol Use: No  . Drug Use: Yes    Special: "Crack" cocaine, Cocaine     Comment: crack-couple months sober    . Sexual Activity: Not Currently    Partners: Male    Birth Control/ Protection: None   Other Topics Concern  . Not on file   Social History Narrative  . No narrative on file    Allergies  Allergen Reactions  . Darvocet [Propoxyphene-Acetaminophen] Hives  . Percocet [Oxycodone-Acetaminophen] Hives    Current Facility-Administered Medications  Medication Dose Route Frequency Provider Last Rate Last Dose  . fentaNYL (SUBLIMAZE) injection 50 mcg  50 mcg Intravenous Once Audree Camel, MD      . midazolam (VERSED) 2 MG/2ML injection           . sodium chloride 0.9 % bolus 1,000 mL  1,000 mL Intravenous Once Audree Camel, MD       Current Outpatient Prescriptions  Medication Sig Dispense Refill  . ARIPiprazole (ABILIFY) 10 MG tablet Take 10 mg by mouth daily.        . divalproex (DEPAKOTE) 500 MG DR tablet Take 500 mg by mouth daily.           No family history on file.   Review of Systems:     Cardiac Review of Systems: Y or N  Chest Pain [ y   ]  Resting SOB [  y ] Exertional SOB  Cove.Etienne  ]  Orthopnea Cove.Etienne  ]   Pedal Edema [   ]    Palpitations [  ] Syncope  [  ]   Presyncope [   ]  General Review of Systems: [Y] = yes [  ]=no Constitional: recent weight change [  ]; anorexia [  ]; fatigue [  ]; nausea [  ]; night sweats [  ]; fever [  ]; or chills [  ]                                                               Dental: poor dentition[y  ]; Last Dentist visit: unknown  Eye : blurred vision [  ]; diplopia [   ]; vision changes [  ];  Amaurosis fugax[  ]; Resp: cough [  ];  wheezing[  ];  hemoptysis[  ]; shortness of breath[  ]; paroxysmal nocturnal dyspnea[  ]; dyspnea on exertion[  ]; or orthopnea[  ];  GI:  gallstones[  ], vomiting[  ];  dysphagia[  ]; melena[  ];  hematochezia [  ]; heartburn[  ];   Hx of  Colonoscopy[  ]; GU: kidney stones [  ]; hematuria[  ];   dysuria [  ];  nocturia[  ];  history of     obstruction [  ]; urinary frequency [  ]             Skin:  rash, swelling[  ];, hair loss[  ];  peripheral edema[  ];  or itching[  ]; Musculosketetal: myalgias[  ];  joint swelling[  ];  joint erythema[  ];  joint pain[  ];  back pain[  ];  Heme/Lymph: bruising[  ];  bleeding[  ];  anemia[  ];  Neuro: TIA[  ];  headaches[  ];  stroke[  ];  vertigo[  ];  seizures[  ];   paresthesias[  ];  difficulty walking[  ];  Psych:depression[ y ]; anxiety[ y ];  Endocrine: diabetes[  n];  thyroid dysfunction[  ];  Immunizations: Flu [ n ]; Pneumococcal[n  ];  Other:  Physical Exam: BP 189/161  Pulse 144  Temp(Src) 98.8 F (37.1 C) (Oral)  Resp 24  SpO2 86%  LMP 01/21/2013  General appearance: alert, cachectic, combative and mild distress Neurologic: intact Heart: regular rate and rhythm, S1, S2 normal, no murmur, click, rub or gallop and no click Lungs: diminished breath sounds LLL and LUL Abdomen: soft, non-tender; bowel sounds normal; no masses,  no organomegaly Extremities: extremities normal, atraumatic, no cyanosis or edema and Homans sign is negative, no sign of DVT Poor hygiene, has not been caring for self  Diagnostic Studies & Laboratory data:     Recent Radiology Findings:  Follow up chest xray after ct placement lung inflated with no air leak    Dg Chest Port 1 View  03/06/2013   CLINICAL DATA:  Cough, shortness of breath and chest pain. Fever and chills.  EXAM: PORTABLE CHEST - 1 VIEW  COMPARISON:  No priors.  FINDINGS: Large left-sided pneumothorax, occupying approximately 50% or more of the left hemithorax volume. At this time, there is no definite left to right shift of the cardiomediastinal structures to strongly  suggest a tension pneumothorax. Mild diffuse peribronchial cuffing. No acute consolidative airspace disease. No pleural effusions. Heart size is normal.  IMPRESSION: 1. Large left-sided pneumothorax, as above. No definite signs of tension pneumothorax at this time. 2. Diffuse peribronchial cuffing may suggest an underlying  bronchitis. CriticalValue/emergent results were called by telephone at the time of interpretation on 03/06/2013 at 7:50 PMto Dr. Criss Alvine, who verbally acknowledged these results.   Electronically Signed   By: Trudie Reed M.D.   On: 03/06/2013 19:51      Recent Lab Findings: Lab Results  Component Value Date   WBC 11.8* 03/06/2013   HGB 16.1* 03/06/2013   HCT 45.4 03/06/2013   PLT 253 03/06/2013   GLUCOSE 86 04/01/2011   NA 140 04/01/2011   K 3.4* 04/01/2011   CL 105 04/01/2011   CREATININE 0.80 04/01/2011   BUN 14 04/01/2011      Assessment / Plan:   Place ct on suction for 24 hours, to water seal tomorrow if no air leak Medical service to admit treat underlying addiction issues and make appropriate  Referrals and monitor for withdrawal   Delight Ovens MD      [address] [place] [address] Office [phone]   Beeper [phone]  03/06/2013 7:55 PM

## 2013-03-06 NOTE — ED Notes (Signed)
Chest tube insertion completed by Dr. Criss Alvine.  Bilateral breath sounds present; symmetrical chest expansion.  Dressing placed by EDP and Cardiothoracic surgeon.

## 2013-03-06 NOTE — ED Provider Notes (Signed)
CSN: [id number]     Arrival date & time 03/06/13  1845 History   First MD Initiated Contact with Patient 03/06/13 1856     Chief Complaint  Patient presents with  . Chest Pain  . Shortness of Breath   (Consider location/radiation/quality/duration/timing/severity/associated sxs/prior Treatment) HPI Comments: 37 yo female with known cocaine abuse presents with left sided chest pain, dyspnea and cough since last night. Occurred about 2 hours after using cocaine. Started in left shoulder and progressed to her chest. Feels like the time she had a collapsed lung on the right side many years ago. Pain is currently a 10/10.   Past Medical History  Diagnosis Date  . Depression   . Substance abuse     crack cocaine   Past Surgical History  Procedure Laterality Date  . Right lung collapse     No family history on file. History  Substance Use Topics  . Smoking status: Current Every Day Smoker -- 1.00 packs/day for 19 years    Types: Cigarettes  . Smokeless tobacco: Never Used  . Alcohol Use: No   OB History   Grav Para Term Preterm Abortions TAB SAB Ect Mult Living   4 3 3  1  1   3      Review of Systems  Unable to perform ROS: Unstable vital signs    Allergies  Darvocet and Percocet  Home Medications   Current Outpatient Rx  Name  Route  Sig  Dispense  Refill  . ARIPiprazole (ABILIFY) 10 MG tablet   Oral   Take 10 mg by mouth daily.           . divalproex (DEPAKOTE) 500 MG DR tablet   Oral   Take 500 mg by mouth daily.            BP 189/161  Pulse 144  Temp(Src) 98.8 F (37.1 C) (Oral)  Resp 24  SpO2 86%  LMP 01/21/2013 Physical Exam  Vitals reviewed. Constitutional: She is oriented to person, place, and time. She appears well-developed and well-nourished. She appears distressed.  HENT:  Head: Normocephalic and atraumatic.  Right Ear: External ear normal.  Left Ear: External ear normal.  Nose: Nose normal.  Eyes: Right eye exhibits no discharge. Left eye  exhibits no discharge.  Cardiovascular: Regular rhythm, normal heart sounds and intact distal pulses.  Tachycardia present.   Pulmonary/Chest: Tachypnea noted. She is in respiratory distress. She has decreased breath sounds in the left upper field and the left lower field.  Abdominal: Soft. There is no tenderness.  Neurological: She is alert and oriented to person, place, and time.  Skin: Skin is warm and dry.    ED Course  CHEST TUBE INSERTION Date/Time: 03/06/2013 7:54 PM Performed by: Pricilla Loveless T Authorized by: Pricilla Loveless T Consent: Verbal consent obtained. written consent obtained. Risks and benefits: risks, benefits and alternatives were discussed Consent given by: patient Indications: pneumothorax Patient sedated: no Anesthesia: local infiltration Local anesthetic: lidocaine 1% without epinephrine Anesthetic total: 5 ml Preparation: skin prepped with ChloraPrep Placement location: left lateral Tube size: 28 [place] Dissection instrument: Kelly clamp Ultrasound guidance: no Tension pneumothorax heard: no Tube connected to: suction Suture material: 0 silk Dressing: 4x4 sterile gauze Post-insertion x-ray findings: tube in good position Patient tolerance: Patient tolerated the procedure well with no immediate complications.   (including critical care time) Labs Review Labs Reviewed  CBC - Abnormal; Notable for the following:    WBC 11.8 (*)    RBC  5.22 (*)    Hemoglobin 16.1 (*)    All other components within normal limits  BASIC METABOLIC PANEL - Abnormal; Notable for the following:    Sodium 134 (*)    Glucose, Bld 130 (*)    GFR calc non Af Amer 84 (*)    All other components within normal limits  PRO B NATRIURETIC PEPTIDE  URINE RAPID DRUG SCREEN (HOSP PERFORMED)  POCT I-STAT TROPONIN I   Imaging Review Dg Chest Port 1 View  03/06/2013   *RADIOLOGY REPORT*  Clinical Data: Chest tube insertion  PORTABLE CHEST - 1 VIEW  Comparison: Same date.   Findings: There has been interval placement of left-sided chest tube with tip projected over the left upper lobe.  Resolution of previously noted pneumothorax is noted.  Right lung is clear. Scarring is noted in both lower lobes.  Cardiomediastinal silhouette is noted.  Small amount of subcutaneous emphysema is seen over left lateral chest wall noted.  IMPRESSION: Interval placement of left-sided chest tube with resolution of the pneumothorax noted on prior exam.   Original Report Authenticated By: Lupita Raider.,  M.D.   Dg Chest Port 1 View  03/06/2013   CLINICAL DATA:  Cough, shortness of breath and chest pain. Fever and chills.  EXAM: PORTABLE CHEST - 1 VIEW  COMPARISON:  No priors.  FINDINGS: Large left-sided pneumothorax, occupying approximately 50% or more of the left hemithorax volume. At this time, there is no definite left to right shift of the cardiomediastinal structures to strongly suggest a tension pneumothorax. Mild diffuse peribronchial cuffing. No acute consolidative airspace disease. No pleural effusions. Heart size is normal.  IMPRESSION: 1. Large left-sided pneumothorax, as above. No definite signs of tension pneumothorax at this time. 2. Diffuse peribronchial cuffing may suggest an underlying bronchitis. CriticalValue/emergent results were called by telephone at the time of interpretation on 03/06/2013 at 7:50 PMto Dr. Criss Alvine, who verbally acknowledged these results.   Electronically Signed   By: Trudie Reed M.D.   On: 03/06/2013 19:51    MDM   1. Pneumothorax on left    No tension physiology based on exam, vitals and Xray. D/w with Dr. Tyrone Sage (CT surgery), placed a 28 french chest tube w/o difficulty and with resolution of PTX. Given fluids and pain meds as well. Will admit to hospitalist in step down for close monitoring. Resp distress resolved with chest tube placement.     Audree Camel, MD 03/07/13 [phone]

## 2013-03-07 ENCOUNTER — Inpatient Hospital Stay (HOSPITAL_COMMUNITY): Payer: Medicare Other

## 2013-03-07 DIAGNOSIS — J9383 Other pneumothorax: Principal | ICD-10-CM

## 2013-03-07 DIAGNOSIS — J930 Spontaneous tension pneumothorax: Secondary | ICD-10-CM

## 2013-03-07 DIAGNOSIS — E43 Unspecified severe protein-calorie malnutrition: Secondary | ICD-10-CM | POA: Insufficient documentation

## 2013-03-07 LAB — MRSA PCR SCREENING: MRSA by PCR: NEGATIVE

## 2013-03-07 LAB — RAPID URINE DRUG SCREEN, HOSP PERFORMED
Barbiturates: NOT DETECTED
Benzodiazepines: NOT DETECTED
Cocaine: POSITIVE — AB
Tetrahydrocannabinol: NOT DETECTED

## 2013-03-07 MED ORDER — ENSURE COMPLETE PO LIQD
237.0000 mL | Freq: Two times a day (BID) | ORAL | Status: DC
  Administered 2013-03-08 – 2013-03-10 (×6): 237 mL via ORAL

## 2013-03-07 MED ORDER — BIOTENE DRY MOUTH MT LIQD
15.0000 mL | Freq: Two times a day (BID) | OROMUCOSAL | Status: DC
  Administered 2013-03-07 – 2013-03-10 (×6): 15 mL via OROMUCOSAL

## 2013-03-07 MED ORDER — OXYCODONE HCL 5 MG PO TABS: 5.0000 mg | ORAL_TABLET | ORAL | Status: DC | PRN

## 2013-03-07 NOTE — Progress Notes (Addendum)
[facility] Progress Note [place] TEAM 1 - Stepdown/ICU TEAM   Deanna Klein ZOX:[id number] DOB: 1975/06/30 DOA: 03/06/2013 PCP: No PCP Per Patient  Admit HPI / Brief Narrative: 37 y.o. female with history of depression, cocaine abuse, and active smoking.  She presented with the complaint of left-sided chest pain. She mentioned that she was at her house sitting in a chair and then suddenly started having chest pain on the left side which was stabbing in nature and sharp nonradiating. The chest pain eased up and then she went to sleep and when she woke up in the morning she started having continuous chest pain which progressively worsened throughout the day. She did mention that she had history of right-sided pneumothorax in the past.  She denies any fall, trauma, injury.   Assessment/Plan:  L spontaneous PTX Care as per TCTS - CT currently to water seal   Cocaine abuse SW to advise pt on options to assist with recovery   Tobacco abuse Counseled on need to d/c use of tobacco   Hyperglycemia Noted on basic metabolic panel - check A1c  Code Status: FULL Family Communication: no family present at time of exam Disposition Plan: SDU due to chest tube  Consultants: TCTS  Procedures: 9/14 - L CT insertion per EDP  Antibiotics: none  DVT prophylaxis: SQ heparin   HPI/Subjective: The patient is resting soundly status post pain medication administration.  She denies current shortness of breath.  Objective: Blood pressure 113/75, pulse 84, temperature 99.1 F (37.3 C), temperature source Oral, resp. rate 19, height 5\' 4"  (1.626 m), weight 43.4 kg (95 lb 10.9 oz), last menstrual period 01/21/2013, SpO2 96.00%.  Intake/Output Summary (Last 24 hours) at 03/07/13 1401 Last data filed at 03/07/13 1149  Gross per 24 hour  Intake 434.17 ml  Output    330 ml  Net 104.17 ml   Exam: General: No acute respiratory distress - disheveled Lungs: Clear to auscultation  bilaterally without wheezes or crackles Cardiovascular: Regular rate and rhythm without murmur gallop or rub normal S1 and S2 Abdomen: Nontender, nondistended, soft, bowel sounds positive, no rebound, no ascites, no appreciable mass Extremities: No significant cyanosis, clubbing, or edema bilateral lower extremities  Data Reviewed: Basic Metabolic Panel:  Recent Labs Lab 03/06/13 1911  NA 134*  K 4.1  CL 100  CO2 20  GLUCOSE 130*  BUN 12  CREATININE 0.86  CALCIUM 9.8   Liver Function Tests: No results found for this basename: AST, ALT, ALKPHOS, BILITOT, PROT, ALBUMIN,  in the last 168 hours  CBC:  Recent Labs Lab 03/06/13 1911  WBC 11.8*  HGB 16.1*  HCT 45.4  MCV 87.0  PLT 253   Recent Results (from the past 240 hour(s))  MRSA PCR SCREENING     Status: None   Collection Time    03/06/13 10:08 PM      Result Value Range Status   MRSA by PCR NEGATIVE  NEGATIVE Final   Comment:            The GeneXpert MRSA Assay (FDA     approved for NASAL specimens     only), is one component of a     comprehensive MRSA colonization     surveillance program. It is not     intended to diagnose MRSA     infection nor to guide or     monitor treatment for     MRSA infections.     Studies:  Recent x-ray studies  have been reviewed in detail by the Attending Physician  Scheduled Meds:  Scheduled Meds: . ARIPiprazole  10 mg Oral QHS  . divalproex  500 mg Oral Daily  . feeding supplement  237 mL Oral BID BM  . heparin  5,000 Units Subcutaneous Q8H  . influenza vac split quadrivalent PF  0.5 mL Intramuscular Tomorrow-1000  . lidocaine  2 patch Transdermal Q24H  . nicotine  14 mg Transdermal Daily  . pantoprazole  40 mg Oral Daily  . pneumococcal 23 valent vaccine  0.5 mL Intramuscular Tomorrow-1000  . sodium chloride  3 mL Intravenous Q12H    Time spent on care of this patient: 25 mins   [facility] T  [facility] Office  [phone] Pager - Text Page  per Loretha Stapler as per below:  On-Call/Text Page:      [email or web address]      password [email or web address]  If 7PM-7AM, please contact night-coverage [email or web address] Password [email or web address] 03/07/2013, 2:01 PM   LOS: 1 day      And a

## 2013-03-07 NOTE — Progress Notes (Signed)
INITIAL NUTRITION ASSESSMENT  DOCUMENTATION CODES Per approved criteria  -Severe malnutrition in the context of social or environmental circumstances -Underweight   INTERVENTION:  Ensure Complete twice daily (350 kcals, 13 gm protein per 8 fl oz bottle) RD to follow for nutrition care plan  NUTRITION DIAGNOSIS: Increased nutrient needs related to malnutrition, repletion as evidenced by estimated nutrition needs  Goal: Pt to meet >/= 90% of their estimated nutrition needs   Monitor:  PO & supplemental intake, weight, labs, I/O's  Reason for Assessment: Malnutrition Screening Tool Report  37 y.o. female  Admitting Dx: Pneumothorax on left  ASSESSMENT: Patient with PMH of depression and cocaine abuse; presented with the complaint of left-sided chest pain which was stabbing and sharp in nature; X-ray in ED showed pneumothorax.  RD unable to obtain nutrition hx from patient; very sleepy and unresponsive; patient with visible severe muscle and subcutaneous fat loss to upper body; no % PO intake available per flowsheet records; RD suspects patient more than likely not been meeting estimated kcals, protein needs for > 1 month; would benefit from addition of supplements ---> RD to order.  Patient meets criteria for severe malnutrition in the context of social or environmental circumstances as evidenced by < 50% intake of estimated energy requirement for > 1 month, severe muscle loss (temples, clavicles, acromion bone) and severe subcutaneous fat loss (upper arm region).  Height: Ht Readings from Last 1 Encounters:  03/06/13 5\' 4"  (1.626 m)    Weight: Wt Readings from Last 1 Encounters:  03/06/13 95 lb 10.9 oz (43.4 kg)    Ideal Body Weight: 120 lb  % Ideal Body Weight: 79%  Wt Readings from Last 10 Encounters:  03/06/13 95 lb 10.9 oz (43.4 kg)  05/30/11 114 lb 6.4 oz (51.891 kg)    Usual Body Weight: 114 lb (December 2012)  % Usual Body Weight: 83%  BMI:  Body mass  index is 16.42 kg/(m^2).  Estimated Nutritional Needs: Kcal: 1500-1700 Protein: 70-80 gm Fluid: 1.5-1.7 L  Skin: Intact  Diet Order: General  EDUCATION NEEDS: -No education needs identified at this time   Intake/Output Summary (Last 24 hours) at 03/07/13 1208 Last data filed at 03/07/13 1149  Gross per 24 hour  Intake 434.17 ml  Output    330 ml  Net 104.17 ml    Labs:   Recent Labs Lab 03/06/13 1911  NA 134*  K 4.1  CL 100  CO2 20  BUN 12  CREATININE 0.86  CALCIUM 9.8  GLUCOSE 130*    Scheduled Meds: . ARIPiprazole  10 mg Oral QHS  . divalproex  500 mg Oral Daily  . heparin  5,000 Units Subcutaneous Q8H  . influenza vac split quadrivalent PF  0.5 mL Intramuscular Tomorrow-1000  . lidocaine  2 patch Transdermal Q24H  . nicotine  14 mg Transdermal Daily  . pantoprazole  40 mg Oral Daily  . pneumococcal 23 valent vaccine  0.5 mL Intramuscular Tomorrow-1000  . sodium chloride  3 mL Intravenous Q12H    Continuous Infusions: . sodium chloride 50 mL/hr at 03/07/13 0900    Past Medical History  Diagnosis Date  . Depression   . Substance abuse     crack cocaine    Past Surgical History  Procedure Laterality Date  . Right lung collapse      Maureen Chatters, RD, LDN Pager #: [phone] After-Hours Pager #: [phone]

## 2013-03-07 NOTE — Progress Notes (Signed)
Utilization review completed.  

## 2013-03-07 NOTE — Progress Notes (Addendum)
      [address]       [place] [address]             [phone]            Subjective: Patienet is sleepy but arousable. She states there is some pain at chest tube site  Objective: Vital signs in last 24 hours: Temp:  [98.3 F (36.8 C)-99.8 F (37.7 C)] 98.5 F (36.9 C) (09/15 1149) Pulse Rate:  [77-144] 84 (09/15 0400) Cardiac Rhythm:  [-] Normal sinus rhythm (09/15 0801) Resp:  [15-29] 19 (09/15 0400) BP: (103-189)/(50-161) 113/75 mmHg (09/15 0801) SpO2:  [86 %-100 %] 96 % (09/15 0400) Weight:  [43.4 kg (95 lb 10.9 oz)] 43.4 kg (95 lb 10.9 oz) (09/14 2223)     Intake/Output from previous day: 09/14 0701 - 09/15 0700 In: 434.2 [I.V.:434.2] Out: 180 [Urine:150; Chest Tube:30]   Physical Exam:  Cardiovascular: RRR Pulmonary: Clear on right and coarse on left; no rales, wheezes, or rhonchi. Wounds: Dressing is clean and dry.   Chest Tube: to suction, no air leak  Lab Results: CBC: Recent Labs  03/06/13 1911  WBC 11.8*  HGB 16.1*  HCT 45.4  PLT 253   BMET:  Recent Labs  03/06/13 1911  NA 134*  K 4.1  CL 100  CO2 20  GLUCOSE 130*  BUN 12  CREATININE 0.86  CALCIUM 9.8    PT/INR: No results found for this basename: LABPROT, INR,  in the last 72 hours ABG:  INR: Will add last result for INR, ABG once components are confirmed Will add last 4 CBG results once components are confirmed  Assessment/Plan: 1.CV-SR 2.Pulmonary-chest tube with scant output last 24 hours. There is no air leak. CXR this am shows tiny left apical pneumothorax, bibasilar atelectasis, and apical bullae. Will place chest tube to water seal. Check CXR in am. 3. Will heplock IV when eating better  ZIMMERMAN,DONIELLE MPA-C 03/07/2013,11:52 AM  I have seen and examined [name] and agree with the above assessment  and plan.  Delight Ovens MD Beeper [phone] Office [phone] 03/07/2013 4:02 PM

## 2013-03-08 ENCOUNTER — Inpatient Hospital Stay (HOSPITAL_COMMUNITY): Payer: Medicare Other

## 2013-03-08 DIAGNOSIS — F1410 Cocaine abuse, uncomplicated: Secondary | ICD-10-CM

## 2013-03-08 DIAGNOSIS — F17200 Nicotine dependence, unspecified, uncomplicated: Secondary | ICD-10-CM

## 2013-03-08 LAB — COMPREHENSIVE METABOLIC PANEL
AST: 11 U/L (ref 0–37)
Albumin: 3 g/dL — ABNORMAL LOW (ref 3.5–5.2)
Chloride: 103 mEq/L (ref 96–112)
Creatinine, Ser: 0.69 mg/dL (ref 0.50–1.10)
Potassium: 4 mEq/L (ref 3.5–5.1)
Sodium: 136 mEq/L (ref 135–145)
Total Bilirubin: 0.3 mg/dL (ref 0.3–1.2)

## 2013-03-08 LAB — HEMOGLOBIN A1C
Hgb A1c MFr Bld: 5.3 % (ref <5.7)
Mean Plasma Glucose: 105 mg/dL (ref <117)

## 2013-03-08 LAB — CBC
Hemoglobin: 13 g/dL (ref 12.0–15.0)
MCH: 29.4 pg (ref 26.0–34.0)
MCV: 88.2 fL (ref 78.0–100.0)
Platelets: 204 10*3/uL (ref 150–400)
RBC: 4.42 MIL/uL (ref 3.87–5.11)

## 2013-03-08 MED ORDER — GUAIFENESIN ER 600 MG PO TB12
600.0000 mg | ORAL_TABLET | Freq: Two times a day (BID) | ORAL | Status: DC
  Administered 2013-03-08 – 2013-03-10 (×5): 600 mg via ORAL
  Filled 2013-03-08 (×6): qty 1

## 2013-03-08 MED ORDER — INFLUENZA VAC SPLIT QUAD 0.5 ML IM SUSP
0.5000 mL | Freq: Once | INTRAMUSCULAR | Status: AC
  Administered 2013-03-08: 0.5 mL via INTRAMUSCULAR
  Filled 2013-03-08: qty 0.5

## 2013-03-08 MED ORDER — TRAMADOL HCL 50 MG PO TABS
50.0000 mg | ORAL_TABLET | Freq: Four times a day (QID) | ORAL | Status: DC | PRN
  Administered 2013-03-08 – 2013-03-09 (×3): 100 mg via ORAL
  Filled 2013-03-08 (×3): qty 2

## 2013-03-08 MED ORDER — PNEUMOCOCCAL VAC POLYVALENT 25 MCG/0.5ML IJ INJ
0.5000 mL | INJECTION | Freq: Once | INTRAMUSCULAR | Status: AC
  Administered 2013-03-08: 0.5 mL via INTRAMUSCULAR
  Filled 2013-03-08: qty 0.5

## 2013-03-08 NOTE — Progress Notes (Addendum)
      [address]       [place] [address]             [phone]            Subjective: Patienet with complaints of cough and pain at chest tube site.  Objective: Vital signs in last 24 hours: Temp:  [97.7 F (36.5 C)-99.1 F (37.3 C)] 97.7 F (36.5 C) (09/16 0700) Pulse Rate:  [73-88] 73 (09/16 0340) Cardiac Rhythm:  [-] Normal sinus rhythm (09/16 0340) Resp:  [12-15] 13 (09/16 0340) BP: (94-103)/(56-74) 94/60 mmHg (09/16 0340) SpO2:  [91 %-97 %] 91 % (09/16 0340)     Intake/Output from previous day: 09/15 0701 - 09/16 0700 In: 1570 [P.O.:420; I.V.:1150] Out: 414 [Urine:400; Chest Tube:14]   Physical Exam:  Cardiovascular: RRR Pulmonary: Clear on right and coarse on left; no rales, wheezes, or rhonchi. Wounds: Dressing is clean and dry.   Chest Tube: to water seal, no air leak  Lab Results: CBC:  Recent Labs  03/06/13 1911 03/08/13 0514  WBC 11.8* 8.1  HGB 16.1* 13.0  HCT 45.4 39.0  PLT 253 204   BMET:   Recent Labs  03/06/13 1911 03/08/13 0514  NA 134* 136  K 4.1 4.0  CL 100 103  CO2 20 25  GLUCOSE 130* 100*  BUN 12 8  CREATININE 0.86 0.69  CALCIUM 9.8 8.8    PT/INR: No results found for this basename: LABPROT, INR,  in the last 72 hours ABG:  INR: Will add last result for INR, ABG once components are confirmed Will add last 4 CBG results once components are confirmed  Assessment/Plan: 1.CV-SR 2.Pulmonary-chest tube with scant output last 24 hours.Chest tube is to water seal and there is no air leak. CXR this am shows questionable trace left apical pneumothorax, bibasilar atelectasis, and apical bullae. Hope to remove chest tube soon.Check CXR in am. Encourage incentive spirometer and flutter valve 3. Will heplock IV today. 4.Mucinex bid for cough  ZIMMERMAN,DONIELLE MPA-C 03/08/2013,8:07 AM  Plan d/c ct tomorrow I have seen and examined [name] and agree with the above assessment  and plan.  Delight Ovens MD Beeper [phone] Office [phone] 03/08/2013 2:27 PM

## 2013-03-08 NOTE — Progress Notes (Signed)
[facility] Progress Note [place] TEAM 1 - Stepdown/ICU TEAM   Deanna Klein OZH:[id number] DOB: [birth year] DOA: 03/06/2013 PCP: No PCP Per Patient  Admit HPI / Brief Narrative: 37 y.o. female with history of depression, cocaine abuse, and active smoking.  She presented with the complaint of left-sided chest pain. She mentioned that she was at her house sitting in a chair and then suddenly started having chest pain on the left side which was stabbing in nature and sharp nonradiating. The chest pain eased up and then she went to sleep and when she woke up in the morning she started having continuous chest pain which progressively worsened throughout the day. She did mention that she had history of right-sided pneumothorax in the past.  She denies any fall, trauma, injury.   Assessment/Plan:  L spontaneous PTX Care as per TCTS - CT currently to water seal   Cocaine abuse - abuses on a daily basis - states she wants to stop -SW to advise pt on options to assist with recovery   Tobacco abuse Counseled on need to d/c use of tobacco   Hyperglycemia Noted on basic metabolic panel -  A1c normal at 5.3  Code Status: FULL Family Communication: no family present at time of exam Disposition Plan: SDU due to chest tube  Consultants: TCTS  Procedures: 9/14 - L CT insertion per EDP  Antibiotics: none  DVT prophylaxis: SQ heparin   HPI/Subjective: The patient is quite sleepy- pain at CT site. Adamant about wanting to quit smoking Crack but does not appear to have a plan and falls asleep while talking about it.   Objective: Blood pressure 99/55, pulse 92, temperature 98.3 F (36.8 C), temperature source Oral, resp. rate 18, height 5\' 4"  (1.626 m), weight 43.4 kg (95 lb 10.9 oz), last menstrual period 01/21/2013, SpO2 93.00%.  Intake/Output Summary (Last 24 hours) at 03/08/13 1726 Last data filed at 03/08/13 1024  Gross per 24 hour  Intake   1573 ml  Output     14 ml   Net   1559 ml   Exam: General: No acute respiratory distress - disheveled Lungs: coarse breath sounds left lower lung field  Cardiovascular: Regular rate and rhythm without murmur gallop or rub normal S1 and S2 Abdomen: Nontender, nondistended, soft, bowel sounds positive, no rebound, no ascites, no appreciable mass Extremities: No significant cyanosis, clubbing, or edema bilateral lower extremities  Data Reviewed: Basic Metabolic Panel:  Recent Labs Lab 03/06/13 1911 03/08/13 0514  NA 134* 136  K 4.1 4.0  CL 100 103  CO2 20 25  GLUCOSE 130* 100*  BUN 12 8  CREATININE 0.86 0.69  CALCIUM 9.8 8.8   Liver Function Tests:  Recent Labs Lab 03/08/13 0514  AST 11  ALT 7  ALKPHOS 54  BILITOT 0.3  PROT 6.5  ALBUMIN 3.0*    CBC:  Recent Labs Lab 03/06/13 1911 03/08/13 0514  WBC 11.8* 8.1  HGB 16.1* 13.0  HCT 45.4 39.0  MCV 87.0 88.2  PLT 253 204   Recent Results (from the past 240 hour(s))  MRSA PCR SCREENING     Status: None   Collection Time    03/06/13 10:08 PM      Result Value Range Status   MRSA by PCR NEGATIVE  NEGATIVE Final   Comment:            The GeneXpert MRSA Assay (FDA     approved for NASAL specimens     only),  is one component of a     comprehensive MRSA colonization     surveillance program. It is not     intended to diagnose MRSA     infection nor to guide or     monitor treatment for     MRSA infections.     Studies:  Recent x-ray studies have been reviewed in detail by the Attending Physician  Scheduled Meds:  Scheduled Meds: . antiseptic oral rinse  15 mL Mouth Rinse BID  . ARIPiprazole  10 mg Oral QHS  . divalproex  500 mg Oral Daily  . feeding supplement  237 mL Oral BID BM  . guaiFENesin  600 mg Oral BID  . heparin  5,000 Units Subcutaneous Q8H  . lidocaine  2 patch Transdermal Q24H  . nicotine  14 mg Transdermal Daily  . pantoprazole  40 mg Oral Daily  . sodium chloride  3 mL Intravenous Q12H    Time spent on  care of this patient: 25 mins   Calvert Cantor, MD  [facility] Office  [phone] Pager - Text Page per Loretha Stapler as per below:  On-Call/Text Page:      [email or web address]      password [email or web address]  If 7PM-7AM, please contact night-coverage [email or web address] Password [email or web address] 03/08/2013, 5:26 PM   LOS: 2 days      And a

## 2013-03-08 NOTE — Progress Notes (Signed)
RT tried to explain flutter administration to patient. Patient very sleepy. RT will attempt again. RT will continue to monitor.

## 2013-03-09 ENCOUNTER — Inpatient Hospital Stay (HOSPITAL_COMMUNITY): Payer: Medicare Other

## 2013-03-09 NOTE — Progress Notes (Addendum)
      [address]       [place] [address]             [phone]            Subjective: Patient with complaints of cough. She hopes chest tube will be removed today.  Objective: Vital signs in last 24 hours: Temp:  [97.4 F (36.3 C)-98.8 F (37.1 C)] 98.3 F (36.8 C) (09/17 0812) Pulse Rate:  [77-105] 77 (09/17 0800) Cardiac Rhythm:  [-] Normal sinus rhythm (09/17 0800) Resp:  [12-22] 15 (09/17 0800) BP: (90-109)/(55-71) 90/55 mmHg (09/17 0800) SpO2:  [88 %-96 %] 95 % (09/17 0800)     Intake/Output from previous day: 09/16 0701 - 09/17 0700 In: 703 [P.O.:700; I.V.:3] Out: 50 [Chest Tube:50]   Physical Exam:  Cardiovascular: RRR Pulmonary: Coarse breath sounds Wounds: Dressing is clean and dry.   Chest Tube: to water seal, no air leak  Lab Results: CBC:  Recent Labs  03/06/13 1911 03/08/13 0514  WBC 11.8* 8.1  HGB 16.1* 13.0  HCT 45.4 39.0  PLT 253 204   BMET:   Recent Labs  03/06/13 1911 03/08/13 0514  NA 134* 136  K 4.1 4.0  CL 100 103  CO2 20 25  GLUCOSE 130* 100*  BUN 12 8  CREATININE 0.86 0.69  CALCIUM 9.8 8.8    PT/INR: No results found for this basename: LABPROT, INR,  in the last 72 hours ABG:  INR: Will add last result for INR, ABG once components are confirmed Will add last 4 CBG results once components are confirmed  Assessment/Plan: 1.CV-SR 2.Pulmonary-chest tube with 50 ccoutput last 24 hours.Chest tube is to water seal and there is no air leak. CXR this am shows questionable trace left apical pneumothorax, bibasilar atelectasis, and apical bullae. Remove chest tube .Check CXR in am. Encourage incentive spirometer and flutter valve   ZIMMERMAN,DONIELLE MPA-C 03/09/2013,8:34 AM   Small ptx after ct removal , stable on follow up film Will get repeat film in am I have seen and examined [name] and agree with the above assessment  and plan.  Delight Ovens MD Beeper [phone] Office  [phone] 03/09/2013 2:58 PM

## 2013-03-09 NOTE — Progress Notes (Addendum)
[facility] Progress Note [name] - Stepdown/ICU TEAM   Deanna Klein:[id number] DOB: Oct 26, 1975 DOA: 03/06/2013 PCP: No PCP Per Patient  Admit HPI / Brief Narrative: 37 y.o. female with history of depression, cocaine abuse, and active smoking.  She presented with the complaint of left-sided chest pain. She was at her house sitting in a chair and suddenly started having chest pain on the left side which was stabbing in nature and sharp nonradiating. The chest pain eased up and then she went to sleep and when she woke up she started having continuous chest pain which progressively worsened throughout the day. She did mention that she had history of right-sided pneumothorax in the past.  She denied fall, trauma, injury.   Assessment/Plan:  L spontaneous PTX Care as per TCTS - CT out with some expansion of ptx post removal   Cocaine abuse - abuses on a daily basis - states she wants to stop - SW to advise pt on options to assist with recovery   Tobacco abuse Counseled on need to d/c use of tobacco   Hyperglycemia Noted on basic metabolic panel -  A1c normal at 5.3  Code Status: FULL Family Communication: no family present at time of exam Disposition Plan: SDU due to chest tube  Consultants: TCTS  Procedures: 9/14 - L CT insertion per EDP  Antibiotics: none  DVT prophylaxis: SQ heparin   HPI/Subjective: No complaints this afternoon.  Tells me she is tired.    Objective: Blood pressure 98/58, pulse 101, temperature 98.1 F (36.7 C), temperature source Oral, resp. rate 16, height 5\' 4"  (1.626 m), weight 43.4 kg (95 lb 10.9 oz), last menstrual period 03/09/2013, SpO2 95.00%.  Intake/Output Summary (Last 24 hours) at 03/09/13 1500 Last data filed at 03/09/13 0800  Gross per 24 hour  Intake    300 ml  Output     50 ml  Net    250 ml   Exam: General: No acute respiratory distress - disheveled Lungs: CTA B w/o wheeze  Cardiovascular: Regular rate  and rhythm without murmur gallop or rub  Abdomen: Nontender, nondistended, soft, bowel sounds positive, no rebound, no ascites, no appreciable mass Extremities: No significant cyanosis, clubbing, or edema bilateral lower extremities  Data Reviewed: Basic Metabolic Panel:  Recent Labs Lab 03/06/13 1911 03/08/13 0514  NA 134* 136  K 4.1 4.0  CL 100 103  CO2 20 25  GLUCOSE 130* 100*  BUN 12 8  CREATININE 0.86 0.69  CALCIUM 9.8 8.8   Liver Function Tests:  Recent Labs Lab 03/08/13 0514  AST 11  ALT 7  ALKPHOS 54  BILITOT 0.3  PROT 6.5  ALBUMIN 3.0*    CBC:  Recent Labs Lab 03/06/13 1911 03/08/13 0514  WBC 11.8* 8.1  HGB 16.1* 13.0  HCT 45.4 39.0  MCV 87.0 88.2  PLT 253 204   Recent Results (from the past 240 hour(s))  MRSA PCR SCREENING     Status: None   Collection Time    03/06/13 10:08 PM      Result Value Range Status   MRSA by PCR NEGATIVE  NEGATIVE Final   Comment:            The GeneXpert MRSA Assay (FDA     approved for NASAL specimens     only), is one component of a     comprehensive MRSA colonization     surveillance program. It is not     intended to  diagnose MRSA     infection nor to guide or     monitor treatment for     MRSA infections.     Studies:  Recent x-ray studies have been reviewed in detail by the Attending Physician  Scheduled Meds:  Scheduled Meds: . antiseptic oral rinse  15 mL Mouth Rinse BID  . ARIPiprazole  10 mg Oral QHS  . divalproex  500 mg Oral Daily  . feeding supplement  237 mL Oral BID BM  . guaiFENesin  600 mg Oral BID  . heparin  5,000 Units Subcutaneous Q8H  . lidocaine  2 patch Transdermal Q24H  . nicotine  14 mg Transdermal Daily  . pantoprazole  40 mg Oral Daily  . sodium chloride  3 mL Intravenous Q12H    Time spent on care of this patient: 25 mins   [facility] T, MD  [facility] Office  [phone] Pager - Text Page per Loretha Stapler as per below:  On-Call/Text Page:       [email or web address]      password [email or web address]  If 7PM-7AM, please contact night-coverage [email or web address] Password [email or web address] 03/09/2013, 3:00 PM   LOS: 3 days

## 2013-03-10 ENCOUNTER — Inpatient Hospital Stay (HOSPITAL_COMMUNITY): Payer: Medicare Other

## 2013-03-10 DIAGNOSIS — E43 Unspecified severe protein-calorie malnutrition: Secondary | ICD-10-CM

## 2013-03-10 MED ORDER — NICOTINE 14 MG/24HR TD PT24: 1.0000 | MEDICATED_PATCH | Freq: Every day | TRANSDERMAL | Status: DC

## 2013-03-10 MED ORDER — GUAIFENESIN ER 600 MG PO TB12: 600.0000 mg | ORAL_TABLET | Freq: Two times a day (BID) | ORAL | Status: DC | PRN

## 2013-03-10 MED ORDER — ENSURE COMPLETE PO LIQD: 237.0000 mL | Freq: Two times a day (BID) | ORAL | Status: DC

## 2013-03-10 MED ORDER — NICOTINE 7 MG/24HR TD PT24: 1.0000 | MEDICATED_PATCH | TRANSDERMAL | Status: DC

## 2013-03-10 NOTE — Progress Notes (Signed)
[facility] DAILY ICU PROGRESS NOTE                   [address]            [place] [address]          [phone]        Total Length of Stay:  LOS: 4 days   Subjective: Feels better, no new complaints  Objective: Vital signs in last 24 hours: Temp:  [98.1 F (36.7 C)-98.6 F (37 C)] 98.6 F (37 C) (09/18 0500) Pulse Rate:  [88-101] 92 (09/18 0812) Cardiac Rhythm:  [-] Normal sinus rhythm (09/18 0812) Resp:  [12-16] 12 (09/18 0812) BP: (90-99)/(54-59) 99/55 mmHg (09/18 0812) SpO2:  [90 %-95 %] 95 % (09/18 0812)  Filed Weights   03/06/13 2223  Weight: 95 lb 10.9 oz (43.4 kg)    Weight change:    Hemodynamic parameters for last 24 hours:    Intake/Output from previous day: 09/17 0701 - 09/18 0700 In: 480 [P.O.:480] Out: 0   Intake/Output this shift: Total I/O In: 240 [P.O.:240] Out: -   Current Meds: Scheduled Meds: . antiseptic oral rinse  15 mL Mouth Rinse BID  . ARIPiprazole  10 mg Oral QHS  . divalproex  500 mg Oral Daily  . feeding supplement  237 mL Oral BID BM  . guaiFENesin  600 mg Oral BID  . heparin  5,000 Units Subcutaneous Q8H  . lidocaine  2 patch Transdermal Q24H  . nicotine  14 mg Transdermal Daily  . pantoprazole  40 mg Oral Daily  . sodium chloride  3 mL Intravenous Q12H   Continuous Infusions:  PRN Meds:.chlorpheniramine-HYDROcodone, HYDROmorphone (DILAUDID) injection, LORazepam, ondansetron (ZOFRAN) IV, ondansetron, traMADol  General appearance: alert, cooperative, fatigued and no distress Heart: regular rate and rhythm Lungs: clear to auscultation bilaterally  Lab Results: CBC: Recent Labs  03/08/13 0514  WBC 8.1  HGB 13.0  HCT 39.0  PLT 204   BMET:  Recent Labs  03/08/13 0514  NA 136  K 4.0  CL 103  CO2 25  GLUCOSE 100*  BUN 8  CREATININE 0.69  CALCIUM 8.8    PT/INR: No results found for this basename: LABPROT, INR,  in the last 72 hours Radiology: Dg Chest 2 View  03/10/2013   CLINICAL DATA:   Followup pneumothorax  EXAM: CHEST  2 VIEW  COMPARISON:  03/09/2013  FINDINGS: Improvement in left pneumothorax. Improvement in the basilar and apical component. Small left effusion remains.  Severe COPD with apical emphysema and bullous change. Left lower lobe atelectasis unchanged.  IMPRESSION: Improving left pneumothorax. Small left effusion is unchanged.   Electronically Signed   By: Marlan Palau M.D.   On: 03/10/2013 08:26   Dg Chest 2 View  03/09/2013   CLINICAL DATA:  FOLLOW UP pneumothorax the  EXAM: CHEST  2 VIEW  COMPARISON:  03/09/2013  FINDINGS: Left pneumothorax is larger. Increase in left apical and left basilar component. Hydro pneumothorax now noted in the left lung base. Left lower lobe atelectasis/ infiltrate unchanged  COPD. Negative for heart failure  IMPRESSION: Enlarging left pneumothorax and slight increase in left effusion.  COPD  Left lower lobe atelectasis or infiltrate unchanged.   Electronically Signed   By: Marlan Palau M.D.   On: 03/09/2013 14:18   Dg Chest Port 1 View  03/09/2013   CLINICAL DATA:  Post left chest tube removal.  EXAM: PORTABLE CHEST - 1 VIEW  COMPARISON:  03/09/2013  FINDINGS: Interval removal of left chest tube. Recurrent left pneumothorax, approximately 10%. Moderate subcutaneous emphysema, stable. Mild hyperinflation of the lungs. Bibasilar atelectasis or scarring. Heart is normal size.  IMPRESSION: Enlarging left pneumothorax following chest tube removal, approximately 10%.  These results will be called to the ordering clinician or representative by the Radiologist Assistant, and communication documented in the PACS Dashboard.   Electronically Signed   By: Charlett Nose M.D.   On: 03/09/2013 09:35   Dg Chest Port 1 View  03/09/2013   CLINICAL DATA:  Left spontaneous pneumothorax. Chest tube  EXAM: PORTABLE CHEST - 1 VIEW  COMPARISON:  03/08/2013  FINDINGS: Left chest tube remains in good position. Question small pneumothorax in the left lung base. Left  apical blebs are noted. It is difficult to identify a and definite left apical pneumothorax but a small one could be present.  Left lower lobe atelectasis unchanged. Mild right lower lobe atelectasis unchanged. Right apical bleb also noted.  IMPRESSION: Question small left pneumothorax unchanged.  Bibasilar atelectasis, left greater than right unchanged.   Electronically Signed   By: Marlan Palau M.D.   On: 03/09/2013 08:31     Assessment/Plan:  CXR appearance conts to improve Plan as per primary service   Leonell Lobdell E 03/10/2013 9:13 AM

## 2013-03-10 NOTE — Discharge Summary (Signed)
Physician Discharge Summary  Deanna Klein ZOX:[id number] DOB: Jul 24, 1975 DOA: 03/06/2013  PCP: No PCP Per Patient  Admit date: 03/06/2013 Discharge date: 03/10/2013  Time spent: >45 minutes   Discharge Diagnoses:  Principal Problem:   Pneumothorax on left Active Problems:   Cocaine abuse   Smoking   Protein-calorie malnutrition, severe   Discharge Condition: stable  Diet recommendation: heart heatlhy  Filed Weights   03/06/13 2223  Weight: 43.4 kg (95 lb 10.9 oz)    History of present illness:  37 y.o. female with history of depression, cocaine abuse, and active smoking.  She presented with the complaint of left-sided chest pain. She mentioned that she was at her house sitting in a chair and then suddenly started having chest pain on the left side which was stabbing in nature and sharp nonradiating. The chest pain eased up and then she went to sleep and when she woke up in the morning she started having continuous chest pain which progressively worsened throughout the day. She did mention that she had history of right-sided pneumothorax in the past. She denies any fall, trauma, injury.   Hospital Course:  L spontaneous PTX  Care per TCTS - CT placed on admission and removed on 9/17. Pt stable despite residual pneumothorax- has been ambulating in hall without difficulty- per TCTS, OK to d/c home today- f/u appt for Dr Tyrone Sage given.   Cocaine abuse  - abuses on a daily basis  - states she wants to stop   Tobacco abuse  Counseled on need to d/c use of tobacco  - have prescribed Nicotine patches - pt actually is requesting them  Hyperglycemia  Noted on basic metabolic panel - W1X normal at 5.3   Consultations:  TCTS  Discharge Exam: Filed Vitals:   03/10/13 1103  BP:   Pulse:   Temp: 98.7 F (37.1 C)  Resp:     General: AAO  X 3, no acute distress Cardiovascular: RRR, no murmurs Respiratory: mild crackles a left lung base  Discharge Instructions   Future Appointments Provider Department Dept Phone   03/17/2013 4:45 PM Delight Ovens, MD [facility] [phone]       Medication List         ABILIFY 10 MG tablet  Generic drug:  ARIPiprazole  Take 10 mg by mouth at bedtime.     divalproex 500 MG DR tablet  Commonly known as:  DEPAKOTE  Take 500 mg by mouth daily.     feeding supplement Liqd  Take 237 mLs by mouth 2 (two) times daily between meals.     guaiFENesin 600 MG 12 hr tablet  Commonly known as:  MUCINEX  Take 1 tablet (600 mg total) by mouth 2 (two) times daily as needed for congestion.     nicotine 14 mg/24hr patch  Commonly known as:  NICODERM CQ - dosed in mg/24 hours  Place 1 patch onto the skin daily.     nicotine 7 mg/24hr patch  Commonly known as:  NICODERM CQ  Place 1 patch onto the skin daily.  Start taking on:  03/23/2013     Potassium 99 MG Tabs  Take 99 mg by mouth daily.     VITAMIN C PO  Take 1 tablet by mouth daily.       Allergies  Allergen Reactions  . Darvocet [Propoxyphene-Acetaminophen] Hives  . Percocet [Oxycodone-Acetaminophen] Hives       Follow-up Information   Follow up with GERHARDT,EDWARD B,  MD On 03/17/2013. (Appointment is at 4:45)    Specialty:  Cardiothoracic Surgery   Contact information:   [address] [place] [address] [place] [address] [phone]       Follow up with [facility] On 03/17/2013. (Please get CXR at 3:45)    Contact information:   [place]        The results of significant diagnostics from this hospitalization (including imaging, microbiology, ancillary and laboratory) are listed below for reference.    Significant Diagnostic Studies: Dg Chest 2 View  03/10/2013   CLINICAL DATA:  Followup pneumothorax  EXAM: CHEST  2 VIEW  COMPARISON:  03/09/2013  FINDINGS: Improvement in left pneumothorax. Improvement in the basilar and apical component. Small left effusion remains.  Severe COPD with apical  emphysema and bullous change. Left lower lobe atelectasis unchanged.  IMPRESSION: Improving left pneumothorax. Small left effusion is unchanged.   Electronically Signed   By: Marlan Palau M.D.   On: 03/10/2013 08:26   Dg Chest 2 View  03/09/2013   CLINICAL DATA:  FOLLOW UP pneumothorax the  EXAM: CHEST  2 VIEW  COMPARISON:  03/09/2013  FINDINGS: Left pneumothorax is larger. Increase in left apical and left basilar component. Hydro pneumothorax now noted in the left lung base. Left lower lobe atelectasis/ infiltrate unchanged  COPD. Negative for heart failure  IMPRESSION: Enlarging left pneumothorax and slight increase in left effusion.  COPD  Left lower lobe atelectasis or infiltrate unchanged.   Electronically Signed   By: Marlan Palau M.D.   On: 03/09/2013 14:18   Dg Chest Port 1 View  03/09/2013   CLINICAL DATA:  Post left chest tube removal.  EXAM: PORTABLE CHEST - 1 VIEW  COMPARISON:  03/09/2013  FINDINGS: Interval removal of left chest tube. Recurrent left pneumothorax, approximately 10%. Moderate subcutaneous emphysema, stable. Mild hyperinflation of the lungs. Bibasilar atelectasis or scarring. Heart is normal size.  IMPRESSION: Enlarging left pneumothorax following chest tube removal, approximately 10%.  These results will be called to the ordering clinician or representative by the Radiologist Assistant, and communication documented in the PACS Dashboard.   Electronically Signed   By: Charlett Nose M.D.   On: 03/09/2013 09:35   Dg Chest Port 1 View  03/09/2013   CLINICAL DATA:  Left spontaneous pneumothorax. Chest tube  EXAM: PORTABLE CHEST - 1 VIEW  COMPARISON:  03/08/2013  FINDINGS: Left chest tube remains in good position. Question small pneumothorax in the left lung base. Left apical blebs are noted. It is difficult to identify a and definite left apical pneumothorax but a small one could be present.  Left lower lobe atelectasis unchanged. Mild right lower lobe atelectasis unchanged. Right  apical bleb also noted.  IMPRESSION: Question small left pneumothorax unchanged.  Bibasilar atelectasis, left greater than right unchanged.   Electronically Signed   By: Marlan Palau M.D.   On: 03/09/2013 08:31   Dg Chest Port 1 View  03/08/2013   CLINICAL DATA:  Left pneumothorax.  EXAM: PORTABLE CHEST - 1 VIEW  COMPARISON:  03/07/2013  FINDINGS: Decreased prominence of left apical pneumothorax with only a trace pneumothorax present at the apex. Stable atelectasis in both lower lungs. No significant pleural fluid. The heart size is normal.  IMPRESSION: Decrease invisible left pneumothorax with trace air present at the apex.   Electronically Signed   By: Irish Lack   On: 03/08/2013 08:08   Dg Chest Port 1 View  03/07/2013   *RADIOLOGY REPORT*  Clinical Data:  Chest tube.  PORTABLE CHEST - 1 VIEW  Comparison: Plain film chest 03/06/2013.  Findings: Left chest tube remains in place.  There is a small left apical pneumothorax.  Lung volumes are lower than on the comparison study with streaky basilar airspace opacities, worse on the left. Apical bullae are noted. Heart size is normal.  No pleural effusion.  IMPRESSION:  1.  Tiny left apical pneumothorax with a left chest tube in place. 2.  Increased basilar airspace opacities likely due to a combination of pulmonary scar and atelectasis. 3.  Bullous change in the apices.   Original Report Authenticated By: Holley Dexter, M.D.   Dg Chest Port 1 View  03/06/2013   *RADIOLOGY REPORT*  Clinical Data: Chest tube insertion  PORTABLE CHEST - 1 VIEW  Comparison: Same date.  Findings: There has been interval placement of left-sided chest tube with tip projected over the left upper lobe.  Resolution of previously noted pneumothorax is noted.  Right lung is clear. Scarring is noted in both lower lobes.  Cardiomediastinal silhouette is noted.  Small amount of subcutaneous emphysema is seen over left lateral chest wall noted.  IMPRESSION: Interval placement of  left-sided chest tube with resolution of the pneumothorax noted on prior exam.   Original Report Authenticated By: Lupita Raider.,  M.D.   Dg Chest Port 1 View  03/06/2013   CLINICAL DATA:  Cough, shortness of breath and chest pain. Fever and chills.  EXAM: PORTABLE CHEST - 1 VIEW  COMPARISON:  No priors.  FINDINGS: Large left-sided pneumothorax, occupying approximately 50% or more of the left hemithorax volume. At this time, there is no definite left to right shift of the cardiomediastinal structures to strongly suggest a tension pneumothorax. Mild diffuse peribronchial cuffing. No acute consolidative airspace disease. No pleural effusions. Heart size is normal.  IMPRESSION: 1. Large left-sided pneumothorax, as above. No definite signs of tension pneumothorax at this time. 2. Diffuse peribronchial cuffing may suggest an underlying bronchitis. CriticalValue/emergent results were called by telephone at the time of interpretation on 03/06/2013 at 7:50 PMto Dr. Criss Alvine, who verbally acknowledged these results.   Electronically Signed   By: Trudie Reed M.D.   On: 03/06/2013 19:51    Microbiology: Recent Results (from the past 240 hour(s))  MRSA PCR SCREENING     Status: None   Collection Time    03/06/13 10:08 PM      Result Value Range Status   MRSA by PCR NEGATIVE  NEGATIVE Final   Comment:            The GeneXpert MRSA Assay (FDA     approved for NASAL specimens     only), is one component of a     comprehensive MRSA colonization     surveillance program. It is not     intended to diagnose MRSA     infection nor to guide or     monitor treatment for     MRSA infections.     Labs: Basic Metabolic Panel:  Recent Labs Lab 03/06/13 1911 03/08/13 0514  NA 134* 136  K 4.1 4.0  CL 100 103  CO2 20 25  GLUCOSE 130* 100*  BUN 12 8  CREATININE 0.86 0.69  CALCIUM 9.8 8.8   Liver Function Tests:  Recent Labs Lab 03/08/13 0514  AST 11  ALT 7  ALKPHOS 54  BILITOT 0.3  PROT 6.5   ALBUMIN 3.0*   No results found for this basename: LIPASE, AMYLASE,  in  the last 168 hours No results found for this basename: AMMONIA,  in the last 168 hours CBC:  Recent Labs Lab 03/06/13 1911 03/08/13 0514  WBC 11.8* 8.1  HGB 16.1* 13.0  HCT 45.4 39.0  MCV 87.0 88.2  PLT 253 204   Cardiac Enzymes: No results found for this basename: CKTOTAL, CKMB, CKMBINDEX, TROPONINI,  in the last 168 hours BNP: BNP (last 3 results)  Recent Labs  03/06/13 1911  PROBNP 63.7   CBG: No results found for this basename: GLUCAP,  in the last 168 hours     Signed:  Dae Highley  [facility] 03/10/2013, 3:25 PM

## 2013-03-10 NOTE — Progress Notes (Signed)
Patient given D/c instruction. She stated she understood them and signed them. Patient IV removed. Patient taken off monitor. Patient assisted in dressing. Patient awaiting ride.  Call bell in reach

## 2013-03-10 NOTE — Care Management Note (Signed)
    Page 1 of 1   03/10/2013     4:50:32 PM   CARE MANAGEMENT NOTE 03/10/2013  Patient:  NABILA, ALBARRACIN   Account Number:  [id number]  Date Initiated:  03/07/2013  Documentation initiated by:  Donn Pierini  Subjective/Objective Assessment:   Pt admitted wtih spontaneous pneumo- chst tube placed     Action/Plan:   PTA pt lived at home- in a Group Home- NCM to follow for d/c needs- will consult CSW   Anticipated DC Date:  03/09/2013   Anticipated DC Plan:  GROUP HOME  In-house referral  Clinical Social Worker         Choice offered to / List presented to:             Status of service:  Completed, signed off Medicare Important Message given?   (If response is "NO", the following Medicare IM given date fields will be blank) Date Medicare IM given:   Date Additional Medicare IM given:    Discharge Disposition:  HOME/SELF CARE  Per UR Regulation:  Reviewed for med. necessity/level of care/duration of stay  If discussed at Long Length of Stay Meetings, dates discussed:    Comments:  03/10/13- 1600- Donn Pierini RN, BSN [phone] pt for d/c today- no CM needs.

## 2013-03-10 NOTE — Progress Notes (Signed)
Utilization review completed.  

## 2013-03-10 NOTE — Progress Notes (Signed)
Patient ambulated off unit with family.

## 2013-03-15 ENCOUNTER — Other Ambulatory Visit: Payer: Self-pay | Admitting: *Deleted

## 2013-03-15 DIAGNOSIS — J9383 Other pneumothorax: Secondary | ICD-10-CM

## 2013-03-17 ENCOUNTER — Ambulatory Visit: Payer: Medicare Other | Admitting: Cardiothoracic Surgery

## 2013-03-18 ENCOUNTER — Other Ambulatory Visit: Payer: Self-pay | Admitting: *Deleted

## 2013-03-21 ENCOUNTER — Ambulatory Visit
Admission: RE | Admit: 2013-03-21 | Discharge: 2013-03-21 | Disposition: A | Discharge status: Home / Self Care | Payer: Medicare Other | Source: Ambulatory Visit | Attending: Cardiothoracic Surgery | Admitting: Cardiothoracic Surgery

## 2013-03-21 ENCOUNTER — Ambulatory Visit (INDEPENDENT_AMBULATORY_CARE_PROVIDER_SITE_OTHER): Payer: Medicare Other | Admitting: Physician Assistant

## 2013-03-21 ENCOUNTER — Other Ambulatory Visit: Payer: Self-pay | Admitting: *Deleted

## 2013-03-21 VITALS — BP 87/64 | HR 90 | Resp 20 | Ht 64.0 in | Wt 96.0 lb

## 2013-03-21 DIAGNOSIS — J9383 Other pneumothorax: Secondary | ICD-10-CM

## 2013-03-21 DIAGNOSIS — F17200 Nicotine dependence, unspecified, uncomplicated: Secondary | ICD-10-CM

## 2013-03-21 DIAGNOSIS — Z720 Tobacco use: Secondary | ICD-10-CM

## 2013-03-21 MED ORDER — NICOTINE 21 MG/24HR TD PT24: 1.0000 | MEDICATED_PATCH | TRANSDERMAL | Status: DC

## 2013-03-21 NOTE — Progress Notes (Signed)
       [address]       [place] [address]             [phone]          HPI: Patient returns for routine follow-up having undergone left chest tube placement by Dr. Tyrone Sage for a spontaneous pneumothorax on 03/06/2013.  Her pneumothorax resolved and chest tube was removed on 03/09/2013.  She was discharged home in stable condition 24 hours later.  Since hospital discharge, the patient has done well.  She denies any recurrent chest pain or shortness of breath.  She continues to smoke, but has decreased from 2 packs per day to "a few" cigarettes per day.  She expresses a desire to quit.  At discharge, she was given a prescription for Nicoderm 14 mg and 7 mg patches.  She states she tried the 14 mg, but felt they weren't strong enough and requests the 21 mg patches to start with.    Current Outpatient Prescriptions  Medication Sig Dispense Refill  . ARIPiprazole (ABILIFY) 10 MG tablet Take 10 mg by mouth at bedtime.       . Ascorbic Acid (VITAMIN C PO) Take 1 tablet by mouth daily.      . divalproex (DEPAKOTE) 500 MG DR tablet Take 500 mg by mouth daily.       . feeding supplement (ENSURE COMPLETE) LIQD Take 237 mLs by mouth 2 (two) times daily between meals.  30 Bottle  2  . guaiFENesin (MUCINEX) 600 MG 12 hr tablet Take 1 tablet (600 mg total) by mouth 2 (two) times daily as needed for congestion.  60 tablet  0  . nicotine (NICODERM CQ - DOSED IN MG/24 HOURS) 14 mg/24hr patch Place 1 patch onto the skin daily.  14 patch  0  . [START ON 03/23/2013] nicotine (NICODERM CQ) 7 mg/24hr patch Place 1 patch onto the skin daily.  14 patch  0  . Potassium 99 MG TABS Take 99 mg by mouth daily.      . nicotine (NICODERM CQ) 21 mg/24hr patch Place 1 patch onto the skin daily.  28 patch  0   No current facility-administered medications for this visit.     Physical Exam: BP 87/64 HR 90 Resp 20 Wounds: Chest tube site clean and dry, suture removed without difficulty. Heart:  regular rate and rhythm Lungs:Clear to auscultation    Diagnostic Tests: Chest xray: Dg Chest 2 View  03/21/2013   CLINICAL DATA:  Followup pneumothorax  EXAM: CHEST  2 VIEW  COMPARISON:  03/10/2013  FINDINGS: COPD with apical emphysema and scarring.  Negative for pneumothorax. Small left apical pneumothorax on the prior study has resolved.  Negative for pneumonia or effusion. No heart failure.  IMPRESSION: COPD with apical emphysema. Negative for pneumothorax.   Electronically Signed   By: Marlan Palau M.D.   On: 03/21/2013 12:25       Assessment/Plan: Spontaneous left pneumothorax, resolved.  Patient is stable and doing well.  I have reiterated the importance of smoking cessation, and have given her a prescription for Nicoderm 21 mg patches, with instructions on how to taper down.  She will follow up as needed.  She is asked to go directly to the ER if she has any recurrent chest pain or shortness of breath.

## 2013-04-18 ENCOUNTER — Other Ambulatory Visit: Payer: Self-pay | Admitting: Physician Assistant

## 2014-01-02 ENCOUNTER — Emergency Department (HOSPITAL_COMMUNITY)
Admission: EM | Admit: 2014-01-02 | Discharge: 2014-01-02 | Discharge status: Against Medical Advice | Payer: Medicare Other | Attending: Emergency Medicine | Admitting: Emergency Medicine

## 2014-01-02 ENCOUNTER — Encounter (HOSPITAL_COMMUNITY): Payer: Self-pay | Admitting: Emergency Medicine

## 2014-01-02 DIAGNOSIS — R6883 Chills (without fever): Secondary | ICD-10-CM | POA: Insufficient documentation

## 2014-01-02 DIAGNOSIS — F17200 Nicotine dependence, unspecified, uncomplicated: Secondary | ICD-10-CM | POA: Diagnosis not present

## 2014-01-02 HISTORY — DX: Unspecified convulsions: R56.9

## 2014-01-02 NOTE — ED Notes (Signed)
Pt states she has been having hot and cold chills with cough for 1-2 weeks.

## 2014-01-02 NOTE — ED Notes (Signed)
Pt came to nurse first and was upset about the wait and she was still in the lobby.  Wait process explained to pt, but was not well received.  Pt stated she should have already been seen and was leaving.  I attempted to convince pt to stay without success

## 2014-02-01 ENCOUNTER — Encounter (HOSPITAL_COMMUNITY): Payer: Self-pay | Admitting: Emergency Medicine

## 2014-02-01 ENCOUNTER — Emergency Department (HOSPITAL_COMMUNITY): Payer: Medicare Other

## 2014-02-01 ENCOUNTER — Inpatient Hospital Stay (HOSPITAL_COMMUNITY)
Admission: EM | Admit: 2014-02-01 | Discharge: 2014-02-04 | DRG: 177 | Discharge status: Against Medical Advice | Payer: Medicare Other | Attending: Internal Medicine | Admitting: Internal Medicine

## 2014-02-01 DIAGNOSIS — D649 Anemia, unspecified: Secondary | ICD-10-CM

## 2014-02-01 DIAGNOSIS — Z79899 Other long term (current) drug therapy: Secondary | ICD-10-CM

## 2014-02-01 DIAGNOSIS — IMO0001 Reserved for inherently not codable concepts without codable children: Secondary | ICD-10-CM | POA: Diagnosis present

## 2014-02-01 DIAGNOSIS — J852 Abscess of lung without pneumonia: Secondary | ICD-10-CM | POA: Diagnosis not present

## 2014-02-01 DIAGNOSIS — F3289 Other specified depressive episodes: Secondary | ICD-10-CM | POA: Diagnosis present

## 2014-02-01 DIAGNOSIS — J850 Gangrene and necrosis of lung: Secondary | ICD-10-CM | POA: Diagnosis present

## 2014-02-01 DIAGNOSIS — E43 Unspecified severe protein-calorie malnutrition: Secondary | ICD-10-CM | POA: Diagnosis present

## 2014-02-01 DIAGNOSIS — F1910 Other psychoactive substance abuse, uncomplicated: Secondary | ICD-10-CM | POA: Diagnosis present

## 2014-02-01 DIAGNOSIS — F17200 Nicotine dependence, unspecified, uncomplicated: Secondary | ICD-10-CM | POA: Diagnosis present

## 2014-02-01 DIAGNOSIS — G40909 Epilepsy, unspecified, not intractable, without status epilepticus: Secondary | ICD-10-CM | POA: Diagnosis present

## 2014-02-01 DIAGNOSIS — F1410 Cocaine abuse, uncomplicated: Secondary | ICD-10-CM | POA: Diagnosis present

## 2014-02-01 DIAGNOSIS — F329 Major depressive disorder, single episode, unspecified: Secondary | ICD-10-CM | POA: Diagnosis present

## 2014-02-01 DIAGNOSIS — Z681 Body mass index (BMI) 19 or less, adult: Secondary | ICD-10-CM

## 2014-02-01 DIAGNOSIS — F32A Depression, unspecified: Secondary | ICD-10-CM | POA: Diagnosis present

## 2014-02-01 DIAGNOSIS — E8801 Alpha-1-antitrypsin deficiency: Secondary | ICD-10-CM | POA: Diagnosis present

## 2014-02-01 DIAGNOSIS — J449 Chronic obstructive pulmonary disease, unspecified: Secondary | ICD-10-CM | POA: Diagnosis present

## 2014-02-01 DIAGNOSIS — J432 Centrilobular emphysema: Secondary | ICD-10-CM

## 2014-02-01 DIAGNOSIS — I9589 Other hypotension: Secondary | ICD-10-CM | POA: Diagnosis present

## 2014-02-01 LAB — CBC WITH DIFFERENTIAL/PLATELET
Basophils Absolute: 0.1 10*3/uL (ref 0.0–0.1)
Basophils Relative: 1 % (ref 0–1)
EOS PCT: 11 % — AB (ref 0–5)
Eosinophils Absolute: 1.4 10*3/uL — ABNORMAL HIGH (ref 0.0–0.7)
HEMATOCRIT: 40 % (ref 36.0–46.0)
Hemoglobin: 13.8 g/dL (ref 12.0–15.0)
LYMPHS ABS: 1.7 10*3/uL (ref 0.7–4.0)
LYMPHS PCT: 13 % (ref 12–46)
MCH: 29.7 pg (ref 26.0–34.0)
MCHC: 34.5 g/dL (ref 30.0–36.0)
MCV: 86.2 fL (ref 78.0–100.0)
MONO ABS: 1.1 10*3/uL — AB (ref 0.1–1.0)
Monocytes Relative: 8 % (ref 3–12)
NEUTROS ABS: 8.6 10*3/uL — AB (ref 1.7–7.7)
Neutrophils Relative %: 67 % (ref 43–77)
PLATELETS: 365 10*3/uL (ref 150–400)
RBC: 4.64 MIL/uL (ref 3.87–5.11)
RDW: 12.7 % (ref 11.5–15.5)
WBC: 12.9 10*3/uL — AB (ref 4.0–10.5)

## 2014-02-01 LAB — COMPREHENSIVE METABOLIC PANEL
ALBUMIN: 2.7 g/dL — AB (ref 3.5–5.2)
ALT: 6 U/L (ref 0–35)
ANION GAP: 13 (ref 5–15)
AST: 10 U/L (ref 0–37)
Alkaline Phosphatase: 61 U/L (ref 39–117)
BUN: 10 mg/dL (ref 6–23)
CALCIUM: 8.8 mg/dL (ref 8.4–10.5)
CO2: 22 mEq/L (ref 19–32)
CREATININE: 0.81 mg/dL (ref 0.50–1.10)
Chloride: 99 mEq/L (ref 96–112)
GFR calc Af Amer: >90 mL/min (ref >90)
GFR calc non Af Amer: 90 mL/min — ABNORMAL LOW (ref >90)
Glucose, Bld: 109 mg/dL — ABNORMAL HIGH (ref 70–99)
Potassium: 4.1 mEq/L (ref 3.7–5.3)
Sodium: 134 mEq/L — ABNORMAL LOW (ref 137–147)
Total Bilirubin: 0.2 mg/dL — ABNORMAL LOW (ref 0.3–1.2)
Total Protein: 7.6 g/dL (ref 6.0–8.3)

## 2014-02-01 LAB — I-STAT TROPONIN, ED: TROPONIN I, POC: 0 ng/mL (ref 0.00–0.08)

## 2014-02-01 MED ORDER — VANCOMYCIN HCL IN DEXTROSE 1-5 GM/200ML-% IV SOLN
1000.0000 mg | Freq: Once | INTRAVENOUS | Status: AC
  Administered 2014-02-02: 1000 mg via INTRAVENOUS
  Filled 2014-02-01: qty 200

## 2014-02-01 MED ORDER — VANCOMYCIN HCL IN DEXTROSE 750-5 MG/150ML-% IV SOLN
750.0000 mg | Freq: Two times a day (BID) | INTRAVENOUS | Status: DC
  Administered 2014-02-02 – 2014-02-04 (×5): 750 mg via INTRAVENOUS
  Filled 2014-02-01 (×5): qty 150

## 2014-02-01 MED ORDER — SODIUM CHLORIDE 0.9 % IV BOLUS (SEPSIS)
1000.0000 mL | Freq: Once | INTRAVENOUS | Status: AC
  Administered 2014-02-02: 1000 mL via INTRAVENOUS

## 2014-02-01 MED ORDER — DEXTROSE 5 % IV SOLN
2.0000 g | Freq: Three times a day (TID) | INTRAVENOUS | Status: DC
  Administered 2014-02-02: 2 g via INTRAVENOUS
  Filled 2014-02-01 (×3): qty 2

## 2014-02-01 MED ORDER — TUBERCULIN PPD 5 UNIT/0.1ML ID SOLN
5.0000 [IU] | Freq: Once | INTRADERMAL | Status: AC
  Administered 2014-02-02: 5 [IU] via INTRADERMAL
  Filled 2014-02-01: qty 0.1

## 2014-02-01 NOTE — ED Notes (Signed)
Pt c/o chest pain x 2 weeks, described as dull pain.  Also c/o increased shortness of breath x 1 week.  Reports bodyaches and chills; also c/o vaginal bright red bleeding, starting last week. Reports last menstrual cycle started on Aug 8 and has continued.  Denies abd pain or cramping.  Reports hx of bilateral pneumothorax x 1 year ago.

## 2014-02-01 NOTE — ED Notes (Addendum)
PT reports chest pain 2 weeks; SOB 1 week; vaginal bleeding with clots. Takes 2 days to soak a pad. SMoker. Hx collapsed lung. Reports feeling cold.

## 2014-02-01 NOTE — ED Provider Notes (Signed)
CSN: [id number]     Arrival date & time 02/01/14  2155 History   First MD Initiated Contact with Patient 02/01/14 2319     Chief Complaint  Patient presents with  . Chest Pain   (Consider location/radiation/quality/duration/timing/severity/associated sxs/prior Treatment) HPI Comments: Patients with history of seizures presents with complaint of left-sided chest pain, shortness of breath, myalgias for the past 2 weeks. Symptoms have gradually worsened. She reports fevers, night sweats, chills, weight loss (she cannot quantify). No hemoptysis. Patient does not have risk factors for TB including immunocompromise, travel, imprisonment, history of TB, definite exposure to TB. She thinks she had a negative PPD last year. She denies alcohol use. Patient also complains of vaginal bleeding. She is a smoker. The onset of this condition was acute.  Aggravating factors: none. Alleviating factors: none.    The history is provided by the patient.    Past Medical History  Diagnosis Date  . Depression   . Substance abuse     crack cocaine  . Seizures    Past Surgical History  Procedure Laterality Date  . Right lung collapse     History reviewed. No pertinent family history. History  Substance Use Topics  . Smoking status: Current Every Day Smoker -- 1.00 packs/day for 19 years    Types: Cigarettes  . Smokeless tobacco: Never Used  . Alcohol Use: No   OB History   Grav Para Term Preterm Abortions TAB SAB Ect Mult Living   4 3 3  1  1   3      Review of Systems  Constitutional: Positive for chills, diaphoresis, appetite change and fatigue. Negative for fever.  HENT: Negative for rhinorrhea and sore throat.   Eyes: Negative for redness.  Respiratory: Positive for cough and shortness of breath.   Cardiovascular: Negative for chest pain.  Gastrointestinal: Negative for nausea, vomiting, abdominal pain and diarrhea.  Genitourinary: Positive for vaginal bleeding. Negative for dysuria.   Musculoskeletal: Positive for myalgias.  Skin: Negative for rash.  Neurological: Negative for headaches.    Allergies  Darvocet and Percocet  Home Medications   Prior to Admission medications   Medication Sig Start Date End Date Taking? Authorizing Provider  ARIPiprazole (ABILIFY) 10 MG tablet Take 10 mg by mouth at bedtime.     Historical Provider, MD  Ascorbic Acid (VITAMIN C PO) Take 1 tablet by mouth daily.    Historical Provider, MD  divalproex (DEPAKOTE) 500 MG DR tablet Take 500 mg by mouth daily.     Historical Provider, MD  feeding supplement (ENSURE COMPLETE) LIQD Take 237 mLs by mouth 2 (two) times daily between meals. 03/10/13   Calvert CantorSaima Rizwan, MD  guaiFENesin (MUCINEX) 600 MG 12 hr tablet Take 1 tablet (600 mg total) by mouth 2 (two) times daily as needed for congestion. 03/10/13   Calvert CantorSaima Rizwan, MD  nicotine (NICODERM CQ - DOSED IN MG/24 HOURS) 14 mg/24hr patch Place 1 patch onto the skin daily. 03/10/13   Calvert CantorSaima Rizwan, MD  nicotine (NICODERM CQ) 21 mg/24hr patch Place 1 patch onto the skin daily. 03/21/13   Wilmon PaliGina L Collins, PA-C  nicotine (NICODERM CQ) 7 mg/24hr patch Place 1 patch onto the skin daily. 03/23/13   Calvert CantorSaima Rizwan, MD  Potassium 99 MG TABS Take 99 mg by mouth daily.    Historical Provider, MD   BP 92/63  Pulse 108  Temp(Src) 98.1 F (36.7 C) (Oral)  Resp 28  Ht 5' 4.5" (1.638 m)  Wt 103 lb (46.72 kg)  BMI 17.41 kg/m2  SpO2 96%  LMP 01/29/2014  Physical Exam  Nursing note and vitals reviewed. Constitutional: She appears well-developed and well-nourished.  HENT:  Head: Normocephalic and atraumatic.  Nose: Nose normal.  Mouth/Throat: Oropharynx is clear and moist.  Eyes: Conjunctivae are normal. Pupils are equal, round, and reactive to light. Right eye exhibits no discharge. Left eye exhibits no discharge.  Neck: Normal range of motion. Neck supple.  Cardiovascular: Regular rhythm and normal heart sounds.  Tachycardia present.   No murmur  heard. Pulmonary/Chest: She is in respiratory distress. She has no wheezes. She has rales (left-sided).  Abdominal: Soft. There is no tenderness. There is no rebound and no guarding.  Neurological: She is alert.  Skin: Skin is warm and dry.  Psychiatric: She has a normal mood and affect.    ED Course  Procedures (including critical care time) Labs Review Labs Reviewed  COMPREHENSIVE METABOLIC PANEL - Abnormal; Notable for the following:    Sodium 134 (*)    Glucose, Bld 109 (*)    Albumin 2.7 (*)    Total Bilirubin 0.2 (*)    GFR calc non Af Amer 90 (*)    All other components within normal limits  CBC WITH DIFFERENTIAL - Abnormal; Notable for the following:    WBC 12.9 (*)    Neutro Abs 8.6 (*)    Monocytes Absolute 1.1 (*)    Eosinophils Relative 11 (*)    Eosinophils Absolute 1.4 (*)    All other components within normal limits  CULTURE, BLOOD (ROUTINE X 2)  CULTURE, BLOOD (ROUTINE X 2)  RAPID HIV SCREEN ([facility])  I-STAT TROPOININ, ED  I-STAT CG4 LACTIC ACID, ED    Imaging Review Dg Chest 2 View  02/01/2014   CLINICAL DATA:  Chest pain for the past 2 weeks.  Shortness breath.  EXAM: CHEST  2 VIEW  COMPARISON:  Chest x-ray 03/21/2013.  FINDINGS: Extensive airspace consolidation is noted in the left upper lobe. On the lateral view there is some posterior bulging of the superior aspect of the left major fissure, indicative of a necrotizing pneumonia. Additionally, there appears to be multiple areas of cavitation, including one area with a significant air-fluid level noted on the lateral projection. Right lung appears relatively clear. Emphysematous changes are again noted throughout the lungs bilaterally. Trace left pleural effusion. No evidence of pulmonary edema. Heart size is normal. Upper mediastinal contours are within normal limits.  IMPRESSION: 1. Severe left upper lobe necrotizing pneumonia with areas of cavitation. This could represent infection with either typical or  atypical organisms, including tuberculosis. Clinical correlation is recommended, with consideration for respiratory isolation if clinically appropriate. 2. Emphysema. These results were called by telephone at the time of interpretation on 02/01/2014 at 10:44 pm to Dr. Nelva Nay, who verbally acknowledged these results.   Electronically Signed   By: Trudie Reed M.D.   On: 02/01/2014 22:45     EKG Interpretation None      11:33 PM Patient seen and examined. Work-up initiated. Medications ordered. Septic work-up ordered.   Vital signs reviewed and are as follows: BP 92/63  Pulse 108  Temp(Src) 98.1 F (36.7 C) (Oral)  Resp 28  Ht 5' 4.5" (1.638 m)  Wt 103 lb (46.72 kg)  BMI 17.41 kg/m2  SpO2 96%  LMP 01/29/2014  11:44 PM Patient discussed with Dr. Radford Pax and Dr. Judd Lien.   12:49 AM Labs resulted. Lactate normal. Spoke with Dr. Toniann Fail who will admit.   MDM  Final diagnoses:  Necrotizing pneumonia   Admit.     Renne Crigler, PA-C 02/02/14 [phone]

## 2014-02-01 NOTE — Progress Notes (Signed)
ANTIBIOTIC CONSULT NOTE - INITIAL  Pharmacy Consult for vancomycin and cefepime Indication: necrotizing pna   Allergies  Allergen Reactions  . Darvocet [Propoxyphene N-Acetaminophen] Hives  . Percocet [Oxycodone-Acetaminophen] Hives    Patient Measurements: Height: 5' 4.5" (163.8 cm) Weight: 103 lb (46.72 kg) IBW/kg (Calculated) : 55.85 Adjusted Body Weight:   Vital Signs: Temp: 98.1 F (36.7 C) (08/12 2210) Temp src: Oral (08/12 2210) BP: 92/63 mmHg (08/12 2308) Pulse Rate: 108 (08/12 2308) Intake/Output from previous day:   Intake/Output from this shift:    Labs:  Recent Labs  02/01/14 2220  WBC 12.9*  HGB 13.8  PLT 365  CREATININE 0.81   Estimated Creatinine Clearance: 68.1 ml/min (by C-G formula based on Cr of 0.81). No results found for this basename: VANCOTROUGH, VANCOPEAK, VANCORANDOM, GENTTROUGH, GENTPEAK, GENTRANDOM, TOBRATROUGH, TOBRAPEAK, TOBRARND, AMIKACINPEAK, AMIKACINTROU, AMIKACIN,  in the last 72 hours   Microbiology: No results found for this or any previous visit (from the past 720 hour(s)).  Medical History: Past Medical History  Diagnosis Date  . Depression   . Substance abuse     crack cocaine  . Seizures     Medications:   (Not in a hospital admission) Assessment: 38 yo smoker with hx of incr sob x1week bodyache fever and chills. Chest pain x2weeks. Had bilateral pneumothorax 1 year ago.vanc and cefepime for broad coverage.   Goal of Therapy:  Vancomycin trough level 15-20 mcg/ml  Plan:  vanc 1gm x1 then 750 mg q12h  Cefepime 2gm q8h   Janice CoffinEarl, Kalil Woessner Jonathan 02/01/2014,11:36 PM

## 2014-02-02 ENCOUNTER — Encounter (HOSPITAL_COMMUNITY): Payer: Self-pay | Admitting: Radiology

## 2014-02-02 ENCOUNTER — Inpatient Hospital Stay (HOSPITAL_COMMUNITY): Payer: Medicare Other

## 2014-02-02 DIAGNOSIS — J850 Gangrene and necrosis of lung: Secondary | ICD-10-CM | POA: Diagnosis present

## 2014-02-02 DIAGNOSIS — F1410 Cocaine abuse, uncomplicated: Secondary | ICD-10-CM | POA: Diagnosis present

## 2014-02-02 DIAGNOSIS — R093 Abnormal sputum: Secondary | ICD-10-CM

## 2014-02-02 DIAGNOSIS — R05 Cough: Secondary | ICD-10-CM

## 2014-02-02 DIAGNOSIS — D649 Anemia, unspecified: Secondary | ICD-10-CM

## 2014-02-02 DIAGNOSIS — F329 Major depressive disorder, single episode, unspecified: Secondary | ICD-10-CM | POA: Diagnosis present

## 2014-02-02 DIAGNOSIS — J852 Abscess of lung without pneumonia: Secondary | ICD-10-CM | POA: Insufficient documentation

## 2014-02-02 DIAGNOSIS — F3289 Other specified depressive episodes: Secondary | ICD-10-CM | POA: Diagnosis present

## 2014-02-02 DIAGNOSIS — J449 Chronic obstructive pulmonary disease, unspecified: Secondary | ICD-10-CM | POA: Diagnosis present

## 2014-02-02 DIAGNOSIS — G40909 Epilepsy, unspecified, not intractable, without status epilepticus: Secondary | ICD-10-CM | POA: Diagnosis present

## 2014-02-02 DIAGNOSIS — Z681 Body mass index (BMI) 19 or less, adult: Secondary | ICD-10-CM | POA: Diagnosis not present

## 2014-02-02 DIAGNOSIS — F1910 Other psychoactive substance abuse, uncomplicated: Secondary | ICD-10-CM | POA: Diagnosis present

## 2014-02-02 DIAGNOSIS — F17200 Nicotine dependence, unspecified, uncomplicated: Secondary | ICD-10-CM

## 2014-02-02 DIAGNOSIS — I9589 Other hypotension: Secondary | ICD-10-CM | POA: Diagnosis present

## 2014-02-02 DIAGNOSIS — E8801 Alpha-1-antitrypsin deficiency: Secondary | ICD-10-CM | POA: Diagnosis present

## 2014-02-02 DIAGNOSIS — R911 Solitary pulmonary nodule: Secondary | ICD-10-CM | POA: Insufficient documentation

## 2014-02-02 DIAGNOSIS — Z79899 Other long term (current) drug therapy: Secondary | ICD-10-CM | POA: Diagnosis not present

## 2014-02-02 DIAGNOSIS — R059 Cough, unspecified: Secondary | ICD-10-CM

## 2014-02-02 DIAGNOSIS — F32A Depression, unspecified: Secondary | ICD-10-CM | POA: Diagnosis present

## 2014-02-02 DIAGNOSIS — E43 Unspecified severe protein-calorie malnutrition: Secondary | ICD-10-CM | POA: Diagnosis present

## 2014-02-02 LAB — CBC WITH DIFFERENTIAL/PLATELET
Basophils Absolute: 0.1 10*3/uL (ref 0.0–0.1)
Basophils Relative: 1 % (ref 0–1)
EOS PCT: 13 % — AB (ref 0–5)
Eosinophils Absolute: 1.3 10*3/uL — ABNORMAL HIGH (ref 0.0–0.7)
HCT: 34.7 % — ABNORMAL LOW (ref 36.0–46.0)
HEMOGLOBIN: 11.5 g/dL — AB (ref 12.0–15.0)
LYMPHS PCT: 21 % (ref 12–46)
Lymphs Abs: 2.1 10*3/uL (ref 0.7–4.0)
MCH: 29.6 pg (ref 26.0–34.0)
MCHC: 33.1 g/dL (ref 30.0–36.0)
MCV: 89.4 fL (ref 78.0–100.0)
MONO ABS: 0.7 10*3/uL (ref 0.1–1.0)
MONOS PCT: 7 % (ref 3–12)
NEUTROS ABS: 5.9 10*3/uL (ref 1.7–7.7)
Neutrophils Relative %: 58 % (ref 43–77)
Platelets: 289 10*3/uL (ref 150–400)
RBC: 3.88 MIL/uL (ref 3.87–5.11)
RDW: 13 % (ref 11.5–15.5)
WBC: 10.1 10*3/uL (ref 4.0–10.5)

## 2014-02-02 LAB — COMPREHENSIVE METABOLIC PANEL
ALK PHOS: 46 U/L (ref 39–117)
ALT: <5 U/L (ref 0–35)
ANION GAP: 10 (ref 5–15)
AST: 8 U/L (ref 0–37)
Albumin: 2 g/dL — ABNORMAL LOW (ref 3.5–5.2)
BUN: 9 mg/dL (ref 6–23)
CO2: 19 mEq/L (ref 19–32)
Calcium: 7.5 mg/dL — ABNORMAL LOW (ref 8.4–10.5)
Chloride: 108 mEq/L (ref 96–112)
Creatinine, Ser: 0.79 mg/dL (ref 0.50–1.10)
GFR calc Af Amer: >90 mL/min (ref >90)
GFR calc non Af Amer: >90 mL/min (ref >90)
Glucose, Bld: 84 mg/dL (ref 70–99)
Potassium: 4.1 mEq/L (ref 3.7–5.3)
SODIUM: 137 meq/L (ref 137–147)
TOTAL PROTEIN: 5.6 g/dL — AB (ref 6.0–8.3)
Total Bilirubin: 0.2 mg/dL — ABNORMAL LOW (ref 0.3–1.2)

## 2014-02-02 LAB — RAPID URINE DRUG SCREEN, HOSP PERFORMED
AMPHETAMINES: NOT DETECTED
BENZODIAZEPINES: NOT DETECTED
Barbiturates: NOT DETECTED
Cocaine: POSITIVE — AB
OPIATES: NOT DETECTED
TETRAHYDROCANNABINOL: NOT DETECTED

## 2014-02-02 LAB — MRSA PCR SCREENING: MRSA BY PCR: NEGATIVE

## 2014-02-02 LAB — I-STAT CG4 LACTIC ACID, ED: LACTIC ACID, VENOUS: 0.65 mmol/L (ref 0.5–2.2)

## 2014-02-02 LAB — RAPID HIV SCREEN (WH-MAU): SUDS RAPID HIV SCREEN: NONREACTIVE

## 2014-02-02 LAB — LACTIC ACID, PLASMA: Lactic Acid, Venous: 0.8 mmol/L (ref 0.5–2.2)

## 2014-02-02 LAB — LEGIONELLA ANTIGEN, URINE: Legionella Antigen, Urine: NEGATIVE

## 2014-02-02 LAB — STREP PNEUMONIAE URINARY ANTIGEN: Strep Pneumo Urinary Antigen: NEGATIVE

## 2014-02-02 LAB — HCG, SERUM, QUALITATIVE: Preg, Serum: NEGATIVE

## 2014-02-02 LAB — TROPONIN I: Troponin I: <0.3 ng/mL (ref <0.30)

## 2014-02-02 LAB — VALPROIC ACID LEVEL: Valproic Acid Lvl: <10 ug/mL — ABNORMAL LOW (ref 50.0–100.0)

## 2014-02-02 LAB — HIV ANTIBODY (ROUTINE TESTING W REFLEX): HIV 1&2 Ab, 4th Generation: NONREACTIVE

## 2014-02-02 MED ORDER — ACETAMINOPHEN 325 MG PO TABS: 650.0000 mg | ORAL_TABLET | Freq: Four times a day (QID) | ORAL | Status: DC | PRN

## 2014-02-02 MED ORDER — IPRATROPIUM-ALBUTEROL 0.5-2.5 (3) MG/3ML IN SOLN
3.0000 mL | Freq: Four times a day (QID) | RESPIRATORY_TRACT | Status: DC
  Administered 2014-02-02 – 2014-02-04 (×8): 3 mL via RESPIRATORY_TRACT
  Filled 2014-02-02 (×9): qty 3

## 2014-02-02 MED ORDER — DIVALPROEX SODIUM 500 MG PO DR TAB
500.0000 mg | DELAYED_RELEASE_TABLET | Freq: Every day | ORAL | Status: DC
  Administered 2014-02-02 – 2014-02-04 (×3): 500 mg via ORAL
  Filled 2014-02-02 (×3): qty 1

## 2014-02-02 MED ORDER — GUAIFENESIN ER 600 MG PO TB12
600.0000 mg | ORAL_TABLET | Freq: Two times a day (BID) | ORAL | Status: DC
  Administered 2014-02-02 – 2014-02-04 (×5): 600 mg via ORAL
  Filled 2014-02-02 (×6): qty 1

## 2014-02-02 MED ORDER — PIPERACILLIN-TAZOBACTAM 3.375 G IVPB
3.3750 g | Freq: Three times a day (TID) | INTRAVENOUS | Status: DC
  Administered 2014-02-02 – 2014-02-04 (×7): 3.375 g via INTRAVENOUS
  Filled 2014-02-02 (×10): qty 50

## 2014-02-02 MED ORDER — ONDANSETRON HCL 4 MG/2ML IJ SOLN: 4.0000 mg | Freq: Four times a day (QID) | INTRAMUSCULAR | Status: DC | PRN

## 2014-02-02 MED ORDER — CLINDAMYCIN PHOSPHATE 600 MG/50ML IV SOLN: 600.0000 mg | Freq: Three times a day (TID) | INTRAVENOUS | Status: DC

## 2014-02-02 MED ORDER — SODIUM CHLORIDE 0.9 % IV SOLN
Freq: Once | INTRAVENOUS | Status: AC
  Administered 2014-02-02: 05:00:00 via INTRAVENOUS

## 2014-02-02 MED ORDER — SODIUM CHLORIDE 0.9 % IV BOLUS (SEPSIS)
500.0000 mL | Freq: Once | INTRAVENOUS | Status: AC
  Administered 2014-02-02: 500 mL via INTRAVENOUS

## 2014-02-02 MED ORDER — SODIUM CHLORIDE 0.9 % IV SOLN
INTRAVENOUS | Status: DC
  Administered 2014-02-02 (×2): via INTRAVENOUS

## 2014-02-02 MED ORDER — IOHEXOL 300 MG/ML  SOLN
80.0000 mL | Freq: Once | INTRAMUSCULAR | Status: AC | PRN
  Administered 2014-02-02: 80 mL via INTRAVENOUS

## 2014-02-02 MED ORDER — ENSURE COMPLETE PO LIQD
237.0000 mL | Freq: Three times a day (TID) | ORAL | Status: DC
Start: 2014-02-02 — End: 2014-02-04
  Administered 2014-02-02 – 2014-02-04 (×6): 237 mL via ORAL

## 2014-02-02 MED ORDER — ARIPIPRAZOLE 10 MG PO TABS
10.0000 mg | ORAL_TABLET | Freq: Every day | ORAL | Status: DC
  Administered 2014-02-02 – 2014-02-03 (×2): 10 mg via ORAL
  Filled 2014-02-02 (×3): qty 1

## 2014-02-02 MED ORDER — ONDANSETRON HCL 4 MG/2ML IJ SOLN: 4.0000 mg | Freq: Three times a day (TID) | INTRAMUSCULAR | Status: DC | PRN

## 2014-02-02 MED ORDER — ONDANSETRON HCL 4 MG PO TABS: 4.0000 mg | ORAL_TABLET | Freq: Four times a day (QID) | ORAL | Status: DC | PRN

## 2014-02-02 MED ORDER — ACETAMINOPHEN 650 MG RE SUPP: 650.0000 mg | Freq: Four times a day (QID) | RECTAL | Status: DC | PRN

## 2014-02-02 MED ORDER — ENOXAPARIN SODIUM 40 MG/0.4ML ~~LOC~~ SOLN
40.0000 mg | Freq: Every day | SUBCUTANEOUS | Status: DC
  Administered 2014-02-02 – 2014-02-04 (×3): 40 mg via SUBCUTANEOUS
  Filled 2014-02-02 (×3): qty 0.4

## 2014-02-02 MED ORDER — SODIUM CHLORIDE 0.9 % IV SOLN
INTRAVENOUS | Status: DC
  Administered 2014-02-04: 01:00:00 via INTRAVENOUS

## 2014-02-02 MED ORDER — CLINDAMYCIN PHOSPHATE 600 MG/50ML IV SOLN
600.0000 mg | Freq: Three times a day (TID) | INTRAVENOUS | Status: DC
  Administered 2014-02-02 (×2): 600 mg via INTRAVENOUS
  Filled 2014-02-02 (×4): qty 50

## 2014-02-02 NOTE — Consult Note (Addendum)
Name: Deanna Klein MRN: [id number] DOB: 1975/10/24    ADMISSION DATE:  02/01/2014 CONSULTATION DATE:  02/02/2014  REFERRING MD :  Toniann Fail PRIMARY SERVICE:  TRH  CHIEF COMPLAINT:  PNA, ? necrotizing  BRIEF PATIENT DESCRIPTION: 38 y.o. F with PMH of depression, substance abuse, seizures, b/l pneumothorax 1 year ago, presents to ED with SOB, myalgias x 2 weeks.  In ED, CXR concerning for necrotizing PNA.  CT obtained which revealed   SIGNIFICANT EVENTS / STUDIES:  CXR 8/12 >>> severe LUL necrotizing PNA with areas of cavitation, emphysema. CT Chest 8/12 >>> necrotizing PNA in LUL with multiple small fluid-filled cavities, unclear whether these are superinfected fluid filled bulla or small abscesses.  Advanced cetrilobar and paraseptal emphysema.  3.8 x 6 x 5mm nodule in RUL. A1AT level and phenotype 8/12 >>>  LINES / TUBES: PIV  CULTURES: Blood 8/12 >>> AFB Mycobacterium 8/12 >>> U. Strep >>> U. Legionella >>>   ANTIBIOTICS: Vanc 8/12 >>> zosyn 8/12 >>> Clinda 8/12 >>>  HISTORY OF PRESENT ILLNESS:  Deanna Klein is a 38 y.o. F with PMH of depression, substance abuse (cocaine), seizures, b/l pneumothorax 1 year ago.  She presented to ED 8/12 with SOB and myalgias x 2 weeks.  She also reports fevers, chills, and a cough.  Cough was initially productive of greenish colored sputum which later became clear.  She states she did have chest pain when symptoms began; however, they resolved roughly 4 - 5 days ago.  She also had night sweats for 1 - 2 nights only.  SOB has gradually worsened since onset.  She denies any hemoptysis. No risk factors for TB and states that she had a negative PPD 1 year ago. She is a smoker, roughly 20 pack year hx. In ED, CXR and CT revealed PNA, concerning for necrotizing PNA.  She was started on empiric abx and admitted by [facility].  She is hypotensive (SBP 88); however, she states that she always has low blood pressure, SBP < 100. PCCM was asked to  consult.  PAST MEDICAL HISTORY :  Past Medical History  Diagnosis Date  . Depression   . Substance abuse     crack cocaine  . Seizures    Past Surgical History  Procedure Laterality Date  . Right lung collapse     Prior to Admission medications   Medication Sig Start Date End Date Taking? Authorizing Provider  ARIPiprazole (ABILIFY) 10 MG tablet Take 10 mg by mouth at bedtime.    Yes Historical Provider, MD  divalproex (DEPAKOTE) 500 MG DR tablet Take 500 mg by mouth daily.    Yes Historical Provider, MD   Allergies  Allergen Reactions  . Darvocet [Propoxyphene N-Acetaminophen] Hives  . Percocet [Oxycodone-Acetaminophen] Hives    FAMILY HISTORY:  History reviewed. No pertinent family history. SOCIAL HISTORY:  reports that she has been smoking Cigarettes.  She has a 19 pack-year smoking history. She has never used smokeless tobacco. She reports that she uses illicit drugs ("Crack" cocaine and Cocaine). She reports that she does not drink alcohol.  REVIEW OF SYSTEMS:   All negative; except for those that are bolded, which indicate positives.  Constitutional: weight loss, weight gain, night sweats, fevers, chills, fatigue, weakness.  HEENT: headaches, sore throat, sneezing, nasal congestion, post nasal drip, difficulty swallowing, tooth/dental problems, visual complaints, visual changes, ear aches. Neuro: difficulty with speech, weakness, numbness, ataxia. CV:  chest pain, orthopnea, PND, swelling in lower extremities, dizziness, palpitations, syncope.  Resp: cough, hemoptysis,  dyspnea, wheezing. GI  heartburn, indigestion, abdominal pain, nausea, vomiting, diarrhea, constipation, change in bowel habits, loss of appetite, hematemesis, melena, hematochezia.  GU: dysuria, change in color of urine, urgency or frequency, flank pain, hematuria. MSK: joint pain or swelling, decreased range of motion. Psych: change in mood or affect, depression, anxiety, suicidal ideations, homicidal  ideations. Skin: rash, itching, bruising.   SUBJECTIVE:  Reports SOB is not as bad now as it has been.  Denies chest pain.  Has a cough, occasionally productive of clear sputum.  VITAL SIGNS: Temp:  [98.1 F (36.7 C)-98.7 F (37.1 C)] 98.7 F (37.1 C) (08/13 0032) Pulse Rate:  [90-120] 90 (08/13 0207) Resp:  [20-28] 20 (08/13 0207) BP: (79-146)/(40-105) 79/40 mmHg (08/13 0207) SpO2:  [93 %-98 %] 98 % (08/13 0207) Weight:  [46.72 kg (103 lb)] 46.72 kg (103 lb) (08/12 2210)  PHYSICAL EXAMINATION: General: Middle aged female, resting in bed, in NAD, eating a sandwich. Neuro: A&O x 3, non-focal.  HEENT:  AFB/AT. PERRL, sclerae anicteric, no fullness mandible Cardiovascular: RRR, no M/R/G.  Lungs: Respirations even and unlabored.  Rhonchi and rales on left, otherwise clear. Abdomen: BS x 4, soft, NT/ND.  Musculoskeletal: No gross deformities, no edema.  Skin: Intact, warm, no rashes.   Recent Labs Lab 02/01/14 2220  NA 134*  K 4.1  CL 99  CO2 22  BUN 10  CREATININE 0.81  GLUCOSE 109*    Recent Labs Lab 02/01/14 2220  HGB 13.8  HCT 40.0  WBC 12.9*  PLT 365   Dg Chest 2 View  02/01/2014   CLINICAL DATA:  Chest pain for the past 2 weeks.  Shortness breath.  EXAM: CHEST  2 VIEW  COMPARISON:  Chest x-ray 03/21/2013.  FINDINGS: Extensive airspace consolidation is noted in the left upper lobe. On the lateral view there is some posterior bulging of the superior aspect of the left major fissure, indicative of a necrotizing pneumonia. Additionally, there appears to be multiple areas of cavitation, including one area with a significant air-fluid level noted on the lateral projection. Right lung appears relatively clear. Emphysematous changes are again noted throughout the lungs bilaterally. Trace left pleural effusion. No evidence of pulmonary edema. Heart size is normal. Upper mediastinal contours are within normal limits.  IMPRESSION: 1. Severe left upper lobe necrotizing pneumonia  with areas of cavitation. This could represent infection with either typical or atypical organisms, including tuberculosis. Clinical correlation is recommended, with consideration for respiratory isolation if clinically appropriate. 2. Emphysema. These results were called by telephone at the time of interpretation on 02/01/2014 at 10:44 pm to Dr. Nelva Nay, who verbally acknowledged these results.   Electronically Signed   By: Trudie Reed M.D.   On: 02/01/2014 22:45   Ct Chest W Contrast  02/02/2014   CLINICAL DATA:  Chest pain for the past 2 weeks. Cavitary pneumonia noted on recent chest x-ray.  EXAM: CT CHEST WITH CONTRAST  TECHNIQUE: Multidetector CT imaging of the chest was performed during intravenous contrast administration.  CONTRAST:  107mL OMNIPAQUE IOHEXOL 300 MG/ML  SOLN  COMPARISON:  No priors.  FINDINGS: Mediastinum: Heart size is normal. There is no significant pericardial fluid, thickening or pericardial calcification. Multiple prominent but non pathologically enlarged mediastinal and left hilar lymph nodes are presumably reactive. Esophagus is unremarkable in appearance.  Lungs/Pleura: There is a background of severe centrilobular and paraseptal emphysema. Diffuse bronchial wall thickening. Throughout the left upper lobe there is extensive airspace consolidation. Multiple small cavities are noted, largest  of which is in the medial aspect of the left upper lobe measuring approximately 4.0 x 3.4 cm (image 16 of series 201). Sagittal reconstructions demonstrates significant posterior bowing of the left major fissure, compatible with necrotizing pneumonia. In the anterior aspect of the right upper lobe there is a 8 x 6 x 5 mm nodule with slightly spiculated margins. Trace left pleural effusion layering dependently.  Upper Abdomen: The visualized portions of the upper pole of the right kidney are markedly irregular with multifocal areas of cortical thinning and probable nonobstructive calculus  in the upper pole collecting system measuring 2 mm.  Musculoskeletal: There are no aggressive appearing lytic or blastic lesions noted in the visualized portions of the skeleton.  IMPRESSION: 1. Necrotizing pneumonia in the left upper lobe with multiple small fluid-filled cavities. It is uncertain which of these are simply superinfected fluid-filled bulla and which of these are small pulmonary abscesses. 2. Advanced centrilobular and paraseptal emphysema. Per review of the medical record, this patient has only a 19 pack-year history of smoking. Given the severity of emphysematous disease in this patient, this suggests a potential genetic predilection for development of emphysema, and counseling for smoking cessation is strongly recommended in this young individual. 3. 8 x 6 x 5 mm nodule in the right upper lobe. Repeat chest CT is recommended in 6 months to ensure the stability or resolution of this finding, as a small neoplasm is not excluded.   Electronically Signed   By: Trudie Reedaniel  Entrikin M.D.   On: 02/02/2014 01:48    ASSESSMENT / PLAN:  CAP - concern for necrotizing PNA per radiology report.  Given similar pattern of emphysema/bullae in right lung, could the appearance of fluid-filled cavities in left actually be PNA that is simply mixed in and superimposed on significant bullae? Significant emphysema - in a 38 y.o. Pt with < 20 pack year smoking history, concern for A1AT deficiency. Small RUL pulmonary nodule R/o TB (low clinical suspicion) Recent tooth extraction Recs: Empiric abx (Vanc, zosyn, Clinda), consider ID consult Unsure if she has been hospitalized in last 90 days, may be able to narrow off zosyn soon Send A1AT level and phenotype. Send AFB for Mycobacterium > low suspicion Consider repeat chest CT in 4-6 weeks Panorex film when able, ensure no residual driving abscess pcxr in am  Send pregnancy test Send quantiferon gold Doubt significant role pct, will NOT limit abx, dc Send  HIV BDers Pos balance, bolus if needed, lactic acid is re assuring Isolation noted   Rutherford Guysahul Desai, PA - [facility] Pgr: [phone]  or [phone]  I have fully examined this patient and agree with above findings.     Mcarthur Rossettianiel J. Tyson AliasFeinstein, MD, FACP Pgr: [phone]  Pulmonary & Critical Care

## 2014-02-02 NOTE — ED Notes (Signed)
Pt requesting orange juice; pt given per [place], [place]

## 2014-02-02 NOTE — ED Notes (Signed)
Attempted to call report to [facility] on [facility]. AC making arrangements for airborne room

## 2014-02-02 NOTE — ED Notes (Signed)
Call report [phone] [facility]

## 2014-02-02 NOTE — Progress Notes (Signed)
Attempted to call for report from ED nurse on patient. Ed nurse to call back on [phone].

## 2014-02-02 NOTE — Progress Notes (Addendum)
[facility] PROGRESS NOTE  AMSI DELAROCA FYT:[id number] DOB: May 27, 1976 DOA: 02/01/2014 PCP: No PCP Per Patient  Assessment/Plan: 1. Necrotizing pneumonia -Appreciate Pulm consult -Continue Vanc/Zosyn, Clindamycin -Fu sputum/Blood Cx -FU sputum for AFB-suspicion low , quantiferon gold  2. Severe COPD -duonebs QID -will need Spiriva, ICS Laba at discharge -r/o alpha-1 antitrypsin deficiency.  3. Tobacco abuse and polysubstance abuse  -counseled, using cocaine   4. History of depression - continue home medications  5. Severe protein calorie malnutrition  -RD consult  6. Chronic hypotension -manual BP higher  DVT proph: lovenox  Code Status: Full Code Family Communication: none at bedside Disposition Plan: Keep in SDU   Consultants:  PCCM  Antibiotics:  Vanc/Zosyn  HPI/Subjective: Coughing, feels poorly  Objective: Filed Vitals:   02/02/14 0740  BP: 72/40  Pulse: 90  Temp: 98.2 F (36.8 C)  Resp: 20    Intake/Output Summary (Last 24 hours) at 02/02/14 0824 Last data filed at 02/02/14 0703  Gross per 24 hour  Intake   2125 ml  Output      0 ml  Net   2125 ml   Filed Weights   02/01/14 2210 02/02/14 0400  Weight: 46.72 kg (103 lb) 43.4 kg (95 lb 10.9 oz)    Exam:   General:  Frail, chronically  ill appearing, AAOx3  Cardiovascular: S1S2/RRR  Respiratory: poor air movement, scattered ronchi  Abdomen: soft, Nt, BS present  Musculoskeletal: no edema c/c   Data Reviewed: Basic Metabolic Panel:  Recent Labs Lab 02/01/14 2220  NA 134*  K 4.1  CL 99  CO2 22  GLUCOSE 109*  BUN 10  CREATININE 0.81  CALCIUM 8.8   Liver Function Tests:  Recent Labs Lab 02/01/14 2220  AST 10  ALT 6  ALKPHOS 61  BILITOT 0.2*  PROT 7.6  ALBUMIN 2.7*   No results found for this basename: LIPASE, AMYLASE,  in the last 168 hours No results found for this basename: AMMONIA,  in the last 168 hours CBC:  Recent Labs Lab 02/01/14 2220  02/02/14 0710  WBC 12.9* 10.1  NEUTROABS 8.6* 5.9  HGB 13.8 11.5*  HCT 40.0 34.7*  MCV 86.2 89.4  PLT 365 289   Cardiac Enzymes: No results found for this basename: CKTOTAL, CKMB, CKMBINDEX, TROPONINI,  in the last 168 hours BNP (last 3 results)  Recent Labs  03/06/13 1911  PROBNP 63.7   CBG: No results found for this basename: GLUCAP,  in the last 168 hours  Recent Results (from the past 240 hour(s))  MRSA PCR SCREENING     Status: None   Collection Time    02/02/14  3:52 AM      Result Value Ref Range Status   MRSA by PCR NEGATIVE  NEGATIVE Final   Comment:            The GeneXpert MRSA Assay (FDA     approved for NASAL specimens     only), is one component of a     comprehensive MRSA colonization     surveillance program. It is not     intended to diagnose MRSA     infection nor to guide or     monitor treatment for     MRSA infections.     Studies: Dg Chest 2 View  02/01/2014   CLINICAL DATA:  Chest pain for the past 2 weeks.  Shortness breath.  EXAM: CHEST  2 VIEW  COMPARISON:  Chest x-ray 03/21/2013.  FINDINGS: Extensive  airspace consolidation is noted in the left upper lobe. On the lateral view there is some posterior bulging of the superior aspect of the left major fissure, indicative of a necrotizing pneumonia. Additionally, there appears to be multiple areas of cavitation, including one area with a significant air-fluid level noted on the lateral projection. Right lung appears relatively clear. Emphysematous changes are again noted throughout the lungs bilaterally. Trace left pleural effusion. No evidence of pulmonary edema. Heart size is normal. Upper mediastinal contours are within normal limits.  IMPRESSION: 1. Severe left upper lobe necrotizing pneumonia with areas of cavitation. This could represent infection with either typical or atypical organisms, including tuberculosis. Clinical correlation is recommended, with consideration for respiratory isolation if  clinically appropriate. 2. Emphysema. These results were called by telephone at the time of interpretation on 02/01/2014 at 10:44 pm to Dr. Nelva NayOBERT BEATON, who verbally acknowledged these results.   Electronically Signed   By: Trudie Reedaniel  Entrikin M.D.   On: 02/01/2014 22:45   Ct Chest W Contrast  02/02/2014   CLINICAL DATA:  Chest pain for the past 2 weeks. Cavitary pneumonia noted on recent chest x-ray.  EXAM: CT CHEST WITH CONTRAST  TECHNIQUE: Multidetector CT imaging of the chest was performed during intravenous contrast administration.  CONTRAST:  80mL OMNIPAQUE IOHEXOL 300 MG/ML  SOLN  COMPARISON:  No priors.  FINDINGS: Mediastinum: Heart size is normal. There is no significant pericardial fluid, thickening or pericardial calcification. Multiple prominent but non pathologically enlarged mediastinal and left hilar lymph nodes are presumably reactive. Esophagus is unremarkable in appearance.  Lungs/Pleura: There is a background of severe centrilobular and paraseptal emphysema. Diffuse bronchial wall thickening. Throughout the left upper lobe there is extensive airspace consolidation. Multiple small cavities are noted, largest of which is in the medial aspect of the left upper lobe measuring approximately 4.0 x 3.4 cm (image 16 of series 201). Sagittal reconstructions demonstrates significant posterior bowing of the left major fissure, compatible with necrotizing pneumonia. In the anterior aspect of the right upper lobe there is a 8 x 6 x 5 mm nodule with slightly spiculated margins. Trace left pleural effusion layering dependently.  Upper Abdomen: The visualized portions of the upper pole of the right kidney are markedly irregular with multifocal areas of cortical thinning and probable nonobstructive calculus in the upper pole collecting system measuring 2 mm.  Musculoskeletal: There are no aggressive appearing lytic or blastic lesions noted in the visualized portions of the skeleton.  IMPRESSION: 1. Necrotizing  pneumonia in the left upper lobe with multiple small fluid-filled cavities. It is uncertain which of these are simply superinfected fluid-filled bulla and which of these are small pulmonary abscesses. 2. Advanced centrilobular and paraseptal emphysema. Per review of the medical record, this patient has only a 19 pack-year history of smoking. Given the severity of emphysematous disease in this patient, this suggests a potential genetic predilection for development of emphysema, and counseling for smoking cessation is strongly recommended in this young individual. 3. 8 x 6 x 5 mm nodule in the right upper lobe. Repeat chest CT is recommended in 6 months to ensure the stability or resolution of this finding, as a small neoplasm is not excluded.   Electronically Signed   By: Trudie Reedaniel  Entrikin M.D.   On: 02/02/2014 01:48    Scheduled Meds: . ARIPiprazole  10 mg Oral QHS  . clindamycin (CLEOCIN) IV  600 mg Intravenous 3 times per day  . divalproex  500 mg Oral Daily  . enoxaparin (LOVENOX)  injection  40 mg Subcutaneous Daily  . guaiFENesin  600 mg Oral BID  . ipratropium-albuterol  3 mL Nebulization QID  . piperacillin-tazobactam (ZOSYN)  IV  3.375 g Intravenous 3 times per day  . tuberculin  5 Units Intradermal Once  . vancomycin  750 mg Intravenous Q12H   Continuous Infusions: . sodium chloride 75 mL/hr at 02/02/14 0400   Antibiotics Given (last 72 hours)   Date/Time Action Medication Dose Rate   02/02/14 0613 Given   clindamycin (CLEOCIN) IVPB 600 mg 600 mg 100 mL/hr   02/02/14 0703 Given   piperacillin-tazobactam (ZOSYN) IVPB 3.375 g 3.375 g 12.5 mL/hr      Principal Problem:   Necrotizing pneumonia Active Problems:   Smoking   Protein-calorie malnutrition, severe   Lung abscess   Solitary pulmonary nodule    Time spent:    [facility]  [facility] Pager [phone]. If 7PM-7AM, please contact night-coverage at [email or web address], password [facility] 02/02/2014, 8:24 AM  LOS:  1 day

## 2014-02-02 NOTE — ED Notes (Signed)
Pt given [place] sandwich and Sprite to drink per Dr.Kakrakandy

## 2014-02-02 NOTE — Progress Notes (Signed)
Utilization Review Completed.[employer] T8/13/2015  

## 2014-02-02 NOTE — Consult Note (Signed)
[facility]    Date of Admission:  02/01/2014           Day 1 vancomycin        Day 1 piperacillin tazobactam        Day 1 clindamycin       Reason for Consult: Necrotizing pneumonia    Referring Physician: Dr. Micah Flesheran Feinstein  Principal Problem:   Necrotizing pneumonia Active Problems:   Smoking   Protein-calorie malnutrition, severe   COPD (chronic obstructive pulmonary disease)   Seizure disorder   Depression   Polysubstance abuse   Normocytic anemia   . ARIPiprazole  10 mg Oral QHS  . clindamycin (CLEOCIN) IV  600 mg Intravenous 3 times per day  . divalproex  500 mg Oral Daily  . enoxaparin (LOVENOX) injection  40 mg Subcutaneous Daily  . feeding supplement (ENSURE COMPLETE)  237 mL Oral TID BM  . guaiFENesin  600 mg Oral BID  . ipratropium-albuterol  3 mL Nebulization QID  . piperacillin-tazobactam (ZOSYN)  IV  3.375 g Intravenous 3 times per day  . tuberculin  5 Units Intradermal Once  . vancomycin  750 mg Intravenous Q12H    Recommendations: 1. Continue vancomycin and piperacillin tazobactam pending final cultures 2. Discontinue clindamycin 3. Continue airborne precautions pending AFB stains   Assessment: I suspect that she has acute, bacterial pneumonia that is causing lung necrosis. She has a history of a negative PPD one year ago. I will keep her on vancomycin and piperacillin tazobactam but she does not need the redundant anaerobic coverage afforded by clindamycin. I will follow with you.    HPI: Deanna Klein is a 38 y.o. female with a history of COPD, seizure disorder, depression and polysubstance abuse. She began having a cough more productive of green sputum about 2 weeks ago associated with fever, chills and sweats. After developing her symptoms she also had one tooth extracted. She says that tooth was not abscess. She was placed on oral amoxicillin but continued to feel ill leading to admission yesterday with a new  necrotizing, left upper lobe pneumonia. He is currently uncomfortable and sleepy and unable/unwilling to answer most questions.   Review of Systems: Review of systems not obtained due to patient factors.  Past Medical History  Diagnosis Date  . Depression   . Substance abuse     crack cocaine  . Seizures     History  Substance Use Topics  . Smoking status: Current Every Day Smoker -- 1.00 packs/day for 19 years    Types: Cigarettes  . Smokeless tobacco: Never Used  . Alcohol Use: No    History reviewed. No pertinent family history. Allergies  Allergen Reactions  . Darvocet [Propoxyphene N-Acetaminophen] Hives  . Percocet [Oxycodone-Acetaminophen] Hives    OBJECTIVE: Blood pressure 92/54, pulse 84, temperature 97.9 F (36.6 C), temperature source Oral, resp. rate 26, height 5' 4.5" (1.638 m), weight 95 lb 10.9 oz (43.4 kg), last menstrual period 01/29/2014, SpO2 96.00%. General: She is thin and appears uncomfortable. Her eyes are closed throughout most of the exam Skin: No acute rash Lymph nodes: Firm, shotty axillary adenopathy Oral: Poor dentition Lungs: Left-sided rhonchi Cor: Tachycardic but regular S1 and S2 with no murmurs Abdomen: Soft and nontender Joints and extremities: No acute abnormalities  Lab Results Lab Results  Component Value Date   WBC 10.1 02/02/2014   HGB 11.5* 02/02/2014   HCT 34.7* 02/02/2014  MCV 89.4 02/02/2014   PLT 289 02/02/2014    Lab Results  Component Value Date   CREATININE 0.79 02/02/2014   BUN 9 02/02/2014   NA 137 02/02/2014   K 4.1 02/02/2014   CL 108 02/02/2014   CO2 19 02/02/2014    Lab Results  Component Value Date   ALT <5 02/02/2014   AST 8 02/02/2014   ALKPHOS 46 02/02/2014   BILITOT 0.2* 02/02/2014     Microbiology: Recent Results (from the past 240 hour(s))  MRSA PCR SCREENING     Status: None   Collection Time    02/02/14  3:52 AM      Result Value Ref Range Status   MRSA by PCR NEGATIVE  NEGATIVE Final   Comment:             The GeneXpert MRSA Assay (FDA     approved for NASAL specimens     only), is one component of a     comprehensive MRSA colonization     surveillance program. It is not     intended to diagnose MRSA     infection nor to guide or     monitor treatment for     MRSA infections.    Cliffton Asters, MD [facility] [phone] pager   [phone] cell 02/02/2014, 2:05 PM

## 2014-02-02 NOTE — Progress Notes (Signed)
INITIAL NUTRITION ASSESSMENT  DOCUMENTATION CODES Per approved criteria  -Severe malnutrition in the context of chronic illness -Underweight   INTERVENTION: Ensure Complete po TID, each supplement provides 350 kcal and 13 grams of protein (chocolate flavor)  Encouraged intake of meals and supplements.   NUTRITION DIAGNOSIS: Malnutrition related to chronic illness (drugs/emphysema) as evidenced by severe fat and muscle depletion.   Goal: Pt to meet >/= 90% of their estimated nutrition needs   Monitor:  PO intake, supplement acceptance, weight trends, labs  Reason for Assessment: MD consult  38 y.o. female  Admitting Dx: Necrotizing pneumonia  ASSESSMENT: Pt with PMH of depression and substance abuse admitted with CAP, concern for necrotizing PNA. Significant emphysema noted.  Pt likely eating less PTA due to recent tooth extraction and chest pain/fever/chills for the last 3 weeks. Pt appears to be in pain, groaning, RN in room and addressing needs. Pt is very nonspecific about her intake. Pt feels that she has lost 1-2 pounds in the last week due to her symptoms. Pt was not drinking any nutrition supplements PTA but has drank ensure before and is agreeable to supplement, pt prefers chocolate. Pt ate very little this am.   Nutrition Focused Physical Exam:  Subcutaneous Fat:  Orbital Region: mild depletion  Upper Arm Region: severe depletion  Thoracic and Lumbar Region: severe depletion   Muscle:  Temple Region: mild depletion Clavicle Bone Region: severe depletion  Clavicle and Acromion Bone Region: severe depletion  Scapular Bone Region: severe depletion  Dorsal Hand: mild depletion  Patellar Region: severe depletion  Anterior Thigh Region: severe depletion  Posterior Calf Region: mild depletion   Edema: not present   Height: Ht Readings from Last 1 Encounters:  02/01/14 5' 4.5" (1.638 m)    Weight: Wt Readings from Last 1 Encounters:  02/02/14 95 lb 10.9 oz  (43.4 kg)    Ideal Body Weight: 55.4 kg   % Ideal Body Weight: 128%  Wt Readings from Last 10 Encounters:  02/02/14 95 lb 10.9 oz (43.4 kg)  01/02/14 102 lb (46.267 kg)  03/21/13 96 lb (43.545 kg)  03/06/13 95 lb 10.9 oz (43.4 kg)  05/30/11 114 lb 6.4 oz (51.891 kg)    Usual Body Weight: 99-100 lb   % Usual Body Weight: 96%  BMI:  Body mass index is 16.18 kg/(m^2).  Estimated Nutritional Needs: Kcal: 1500-1700 Protein: 70-85 grams Fluid: > 1.5 L/day  Skin: WDL  Diet Order: General Meal Completion: 10% - Breakfast  EDUCATION NEEDS: -No education needs identified at this time   Intake/Output Summary (Last 24 hours) at 02/02/14 1138 Last data filed at 02/02/14 1100  Gross per 24 hour  Intake   3170 ml  Output    700 ml  Net   2470 ml    Last BM: 8/13   Labs:   Recent Labs Lab 02/01/14 2220 02/02/14 0710  NA 134* 137  K 4.1 4.1  CL 99 108  CO2 22 19  BUN 10 9  CREATININE 0.81 0.79  CALCIUM 8.8 7.5*  GLUCOSE 109* 84    CBG (last 3)  No results found for this basename: GLUCAP,  in the last 72 hours  Scheduled Meds: . ARIPiprazole  10 mg Oral QHS  . clindamycin (CLEOCIN) IV  600 mg Intravenous 3 times per day  . divalproex  500 mg Oral Daily  . enoxaparin (LOVENOX) injection  40 mg Subcutaneous Daily  . guaiFENesin  600 mg Oral BID  . ipratropium-albuterol  3  mL Nebulization QID  . piperacillin-tazobactam (ZOSYN)  IV  3.375 g Intravenous 3 times per day  . tuberculin  5 Units Intradermal Once  . vancomycin  750 mg Intravenous Q12H    Continuous Infusions: . sodium chloride 125 mL/hr at 02/02/14 [id number]    Past Medical History  Diagnosis Date  . Depression   . Substance abuse     crack cocaine  . Seizures     Past Surgical History  Procedure Laterality Date  . Right lung collapse      Kendell BaneHeather Julissa Browning RD, LDN, CNSC [phone] Pager [phone] After Hours Pager

## 2014-02-02 NOTE — H&P (Addendum)
[facility] History and Physical  Deanna Klein ZOX:[id number] DOB: May 18, 1976 DOA: 02/01/2014  Referring physician: ER physician. PCP: No PCP Per Patient   Chief Complaint: Left-sided chest pain.  HPI: Deanna Klein is a 38 y.o. female with previous history of spontaneous pneumothorax last year requiring chest tube placement, polysubstance abuse and depression presents to the ER with complaints of left-sided pleuritic-type chest pain for last 3 weeks. Patient has been having productive cough for last 2 weeks denies any hemoptysis. Patient has been having subjective feeling of fever chills and sweats. Denies any recent travel or contact with tuberculosis. In the ER chest x-ray shows left upper lobe necrotizing pneumonia. Patient had blood cultures drawn and started on empiric antibiotics. Patient otherwise denies any nausea vomiting abdominal pain headache visual symptoms. Patient was recently placed on amoxicillin for tooth extraction 3 weeks ago for caried tooth.  Review of Systems: As presented in the history of presenting illness, rest negative.  Past Medical History  Diagnosis Date  . Depression   . Substance abuse     crack cocaine  . Seizures    Past Surgical History  Procedure Laterality Date  . Right lung collapse     Social History:  reports that she has been smoking Cigarettes.  She has a 19 pack-year smoking history. She has never used smokeless tobacco. She reports that she uses illicit drugs ("Crack" cocaine and Cocaine). She reports that she does not drink alcohol. Where does patient live home. Can patient participate in ADLs? Yes.  Allergies  Allergen Reactions  . Darvocet [Propoxyphene N-Acetaminophen] Hives  . Percocet [Oxycodone-Acetaminophen] Hives    Family History: History reviewed. No pertinent family history.    Prior to Admission medications   Medication Sig Start Date End Date Taking? Authorizing Provider  ARIPiprazole (ABILIFY) 10 MG tablet  Take 10 mg by mouth at bedtime.    Yes Historical Provider, MD  divalproex (DEPAKOTE) 500 MG DR tablet Take 500 mg by mouth daily.    Yes Historical Provider, MD    Physical Exam: Filed Vitals:   02/01/14 2228 02/01/14 2308 02/01/14 2355 02/02/14 0032  BP: 146/105 92/63 85/51 96/53   Pulse:  108 106 95  Temp:    98.7 F (37.1 C)  TempSrc:    Oral  Resp:  28 22 26   Height:      Weight:      SpO2:  96% 93% 96%     General:  Moderately built and poorly nourished.  Eyes: Anicteric no pallor.  ENT: No discharge from the ears eyes nose mouth.  Neck: No mass found.  Cardiovascular: S1-S2 heard.  Respiratory: No rhonchi or crepitations.  Abdomen: Soft nontender bowel sounds present. No guarding rigidity.  Skin: No rash.  Musculoskeletal: No edema.  Psychiatric: Appears normal.  Neurologic: Alert awake oriented to time place and person. Moves all extremities.  Labs on Admission:  Basic Metabolic Panel:  Recent Labs Lab 02/01/14 2220  NA 134*  K 4.1  CL 99  CO2 22  GLUCOSE 109*  BUN 10  CREATININE 0.81  CALCIUM 8.8   Liver Function Tests:  Recent Labs Lab 02/01/14 2220  AST 10  ALT 6  ALKPHOS 61  BILITOT 0.2*  PROT 7.6  ALBUMIN 2.7*   No results found for this basename: LIPASE, AMYLASE,  in the last 168 hours No results found for this basename: AMMONIA,  in the last 168 hours CBC:  Recent Labs Lab 02/01/14 2220  WBC 12.9*  NEUTROABS  8.6*  HGB 13.8  HCT 40.0  MCV 86.2  PLT 365   Cardiac Enzymes: No results found for this basename: CKTOTAL, CKMB, CKMBINDEX, TROPONINI,  in the last 168 hours  BNP (last 3 results)  Recent Labs  03/06/13 1911  PROBNP 63.7   CBG: No results found for this basename: GLUCAP,  in the last 168 hours  Radiological Exams on Admission: Dg Chest 2 View  02/01/2014   CLINICAL DATA:  Chest pain for the past 2 weeks.  Shortness breath.  EXAM: CHEST  2 VIEW  COMPARISON:  Chest x-ray 03/21/2013.  FINDINGS: Extensive  airspace consolidation is noted in the left upper lobe. On the lateral view there is some posterior bulging of the superior aspect of the left major fissure, indicative of a necrotizing pneumonia. Additionally, there appears to be multiple areas of cavitation, including one area with a significant air-fluid level noted on the lateral projection. Right lung appears relatively clear. Emphysematous changes are again noted throughout the lungs bilaterally. Trace left pleural effusion. No evidence of pulmonary edema. Heart size is normal. Upper mediastinal contours are within normal limits.  IMPRESSION: 1. Severe left upper lobe necrotizing pneumonia with areas of cavitation. This could represent infection with either typical or atypical organisms, including tuberculosis. Clinical correlation is recommended, with consideration for respiratory isolation if clinically appropriate. 2. Emphysema. These results were called by telephone at the time of interpretation on 02/01/2014 at 10:44 pm to Dr. Nelva Nay, who verbally acknowledged these results.   Electronically Signed   By: Trudie Reed M.D.   On: 02/01/2014 22:45   Ct Chest W Contrast  02/02/2014   CLINICAL DATA:  Chest pain for the past 2 weeks. Cavitary pneumonia noted on recent chest x-ray.  EXAM: CT CHEST WITH CONTRAST  TECHNIQUE: Multidetector CT imaging of the chest was performed during intravenous contrast administration.  CONTRAST:  38mL OMNIPAQUE IOHEXOL 300 MG/ML  SOLN  COMPARISON:  No priors.  FINDINGS: Mediastinum: Heart size is normal. There is no significant pericardial fluid, thickening or pericardial calcification. Multiple prominent but non pathologically enlarged mediastinal and left hilar lymph nodes are presumably reactive. Esophagus is unremarkable in appearance.  Lungs/Pleura: There is a background of severe centrilobular and paraseptal emphysema. Diffuse bronchial wall thickening. Throughout the left upper lobe there is extensive airspace  consolidation. Multiple small cavities are noted, largest of which is in the medial aspect of the left upper lobe measuring approximately 4.0 x 3.4 cm (image 16 of series 201). Sagittal reconstructions demonstrates significant posterior bowing of the left major fissure, compatible with necrotizing pneumonia. In the anterior aspect of the right upper lobe there is a 8 x 6 x 5 mm nodule with slightly spiculated margins. Trace left pleural effusion layering dependently.  Upper Abdomen: The visualized portions of the upper pole of the right kidney are markedly irregular with multifocal areas of cortical thinning and probable nonobstructive calculus in the upper pole collecting system measuring 2 mm.  Musculoskeletal: There are no aggressive appearing lytic or blastic lesions noted in the visualized portions of the skeleton.  IMPRESSION: 1. Necrotizing pneumonia in the left upper lobe with multiple small fluid-filled cavities. It is uncertain which of these are simply superinfected fluid-filled bulla and which of these are small pulmonary abscesses. 2. Advanced centrilobular and paraseptal emphysema. Per review of the medical record, this patient has only a 19 pack-year history of smoking. Given the severity of emphysematous disease in this patient, this suggests a potential genetic predilection for development  of emphysema, and counseling for smoking cessation is strongly recommended in this young individual. 3. 8 x 6 x 5 mm nodule in the right upper lobe. Repeat chest CT is recommended in 6 months to ensure the stability or resolution of this finding, as a small neoplasm is not excluded.   Electronically Signed   By: Trudie Reedaniel  Entrikin M.D.   On: 02/02/2014 01:48     Assessment/Plan Principal Problem:   Necrotizing pneumonia   1. Necrotizing pneumonia - patient has been placed on vancomycin Zosyn clindamycin as discussed with pulmonary critical care. Follow blood cultures. Since that is concerning for possible  tuberculosis patient has been placed on airborne precautions and sputum for AFB has been ordered. HIV pending. 2. Severe COPD - pulmonologist has been consulted and plans are for further workup for alpha-1 antitrypsin deficiency. 3. Tobacco abuse and polysubstance abuse - tobacco abuse cessation counseling requested. Drug screen pending. 4. History of depression - continue present medications. 5. Previous history of spontaneous pneumothorax. 6. Severe protein calorie malnutrition - Consult nutritionist.    Code Status: Full code.  Family Communication: None.  Disposition Plan: Admit to inpatient    [facility] LLCKAKRAKANDY,Willye Javier N. [facility] Pager [phone].  If 7PM-7AM, please contact night-coverage [email or web address] Password [email or web address] 02/02/2014, 1:53 AM

## 2014-02-03 DIAGNOSIS — F1410 Cocaine abuse, uncomplicated: Secondary | ICD-10-CM

## 2014-02-03 DIAGNOSIS — J438 Other emphysema: Secondary | ICD-10-CM

## 2014-02-03 DIAGNOSIS — J189 Pneumonia, unspecified organism: Secondary | ICD-10-CM

## 2014-02-03 LAB — BASIC METABOLIC PANEL
Anion gap: 13 (ref 5–15)
BUN: 6 mg/dL (ref 6–23)
CO2: 23 meq/L (ref 19–32)
Calcium: 8 mg/dL — ABNORMAL LOW (ref 8.4–10.5)
Chloride: 108 mEq/L (ref 96–112)
Creatinine, Ser: 0.7 mg/dL (ref 0.50–1.10)
GFR calc Af Amer: >90 mL/min (ref >90)
GFR calc non Af Amer: >90 mL/min (ref >90)
GLUCOSE: 144 mg/dL — AB (ref 70–99)
POTASSIUM: 3.8 meq/L (ref 3.7–5.3)
Sodium: 144 mEq/L (ref 137–147)

## 2014-02-03 LAB — CBC
HEMATOCRIT: 32.9 % — AB (ref 36.0–46.0)
Hemoglobin: 11 g/dL — ABNORMAL LOW (ref 12.0–15.0)
MCH: 29.6 pg (ref 26.0–34.0)
MCHC: 33.4 g/dL (ref 30.0–36.0)
MCV: 88.7 fL (ref 78.0–100.0)
Platelets: 303 10*3/uL (ref 150–400)
RBC: 3.71 MIL/uL — AB (ref 3.87–5.11)
RDW: 13 % (ref 11.5–15.5)
WBC: 9 10*3/uL (ref 4.0–10.5)

## 2014-02-03 LAB — QUANTIFERON TB GOLD ASSAY (BLOOD)
Interferon Gamma Release Assay: NEGATIVE
MITOGEN VALUE: 8.05 [IU]/mL
QUANTIFERON NIL VALUE: 0.03 [IU]/mL
TB Ag value: 0.04 IU/mL
TB Antigen Minus Nil Value: 0.01 IU/mL

## 2014-02-03 NOTE — Progress Notes (Signed)
Patient ID: Deanna Klein, female   DOB: [birth year], 38 y.o.   MRN: [id number]         [facility]    Date of Admission:  02/01/2014   Total days of antibiotics 2         Principal Problem:   Necrotizing pneumonia Active Problems:   Smoking   Protein-calorie malnutrition, severe   COPD (chronic obstructive pulmonary disease)   Seizure disorder   Depression   Polysubstance abuse   Normocytic anemia   . ARIPiprazole  10 mg Oral QHS  . divalproex  500 mg Oral Daily  . enoxaparin (LOVENOX) injection  40 mg Subcutaneous Daily  . feeding supplement (ENSURE COMPLETE)  237 mL Oral TID BM  . guaiFENesin  600 mg Oral BID  . ipratropium-albuterol  3 mL Nebulization QID  . piperacillin-tazobactam (ZOSYN)  IV  3.375 g Intravenous 3 times per day  . tuberculin  5 Units Intradermal Once  . vancomycin  750 mg Intravenous Q12H    Subjective: She is feeling better today. Her cough has improved and she is no longer producing any sputum. The sputum has been collected for analysis and the lab.  Past Medical History  Diagnosis Date  . Depression   . Substance abuse     crack cocaine  . Seizures     History  Substance Use Topics  . Smoking status: Current Every Day Smoker -- 1.00 packs/day for 19 years    Types: Cigarettes  . Smokeless tobacco: Never Used  . Alcohol Use: No    History reviewed. No pertinent family history.  Allergies  Allergen Reactions  . Darvocet [Propoxyphene N-Acetaminophen] Hives  . Percocet [Oxycodone-Acetaminophen] Hives    Objective: Temp:  [97.9 F (36.6 C)-98.5 F (36.9 C)] 97.9 F (36.6 C) (08/14 1027) Pulse Rate:  [85-105] 97 (08/14 1027) Resp:  [17-28] 20 (08/14 1027) BP: (73-108)/(39-64) 99/56 mmHg (08/14 1027) SpO2:  [91 %-100 %] 95 % (08/14 1219)  General: She appeared more alert and comfortable Skin: No rash Lungs: Some diminished breath sounds on the left posteriorly otherwise clear Cor: Regular S1 and S2  with no murmur   Lab Results Lab Results  Component Value Date   WBC 9.0 02/03/2014   HGB 11.0* 02/03/2014   HCT 32.9* 02/03/2014   MCV 88.7 02/03/2014   PLT 303 02/03/2014    Lab Results  Component Value Date   CREATININE 0.70 02/03/2014   BUN 6 02/03/2014   NA 144 02/03/2014   K 3.8 02/03/2014   CL 108 02/03/2014   CO2 23 02/03/2014    Lab Results  Component Value Date   ALT <5 02/02/2014   AST 8 02/02/2014   ALKPHOS 46 02/02/2014   BILITOT 0.2* 02/02/2014      Microbiology: Recent Results (from the past 240 hour(s))  CULTURE, BLOOD (ROUTINE X 2)     Status: None   Collection Time    02/01/14 11:45 PM      Result Value Ref Range Status   Specimen Description BLOOD RIGHT ARM   Final   Special Requests BOTTLES DRAWN AEROBIC AND ANAEROBIC [facility] EACH   Final   Culture  Setup Time     Final   Value: 02/02/2014 08:56     Performed at [facility]   Culture     Final   Value:        BLOOD CULTURE RECEIVED NO GROWTH TO DATE CULTURE WILL BE HELD FOR 5  DAYS BEFORE ISSUING A FINAL NEGATIVE REPORT     Performed at [facility]   Report Status PENDING   Incomplete  CULTURE, BLOOD (ROUTINE X 2)     Status: None   Collection Time    02/01/14 11:55 PM      Result Value Ref Range Status   Specimen Description BLOOD LEFT FOREARM   Final   Special Requests BOTTLES DRAWN AEROBIC AND ANAEROBIC [facility] EACH   Final   Culture  Setup Time     Final   Value: 02/02/2014 08:56     Performed at [facility]   Culture     Final   Value:        BLOOD CULTURE RECEIVED NO GROWTH TO DATE CULTURE WILL BE HELD FOR 5 DAYS BEFORE ISSUING A FINAL NEGATIVE REPORT     Performed at [facility]   Report Status PENDING   Incomplete  MRSA PCR SCREENING     Status: None   Collection Time    02/02/14  3:52 AM      Result Value Ref Range Status   MRSA by PCR NEGATIVE  NEGATIVE Final   Comment:            The GeneXpert MRSA Assay (FDA     approved for NASAL specimens     only), is one  component of a     comprehensive MRSA colonization     surveillance program. It is not     intended to diagnose MRSA     infection nor to guide or     monitor treatment for     MRSA infections.    Studies/Results: Dg Chest 2 View  02/01/2014   CLINICAL DATA:  Chest pain for the past 2 weeks.  Shortness breath.  EXAM: CHEST  2 VIEW  COMPARISON:  Chest x-ray 03/21/2013.  FINDINGS: Extensive airspace consolidation is noted in the left upper lobe. On the lateral view there is some posterior bulging of the superior aspect of the left major fissure, indicative of a necrotizing pneumonia. Additionally, there appears to be multiple areas of cavitation, including one area with a significant air-fluid level noted on the lateral projection. Right lung appears relatively clear. Emphysematous changes are again noted throughout the lungs bilaterally. Trace left pleural effusion. No evidence of pulmonary edema. Heart size is normal. Upper mediastinal contours are within normal limits.  IMPRESSION: 1. Severe left upper lobe necrotizing pneumonia with areas of cavitation. This could represent infection with either typical or atypical organisms, including tuberculosis. Clinical correlation is recommended, with consideration for respiratory isolation if clinically appropriate. 2. Emphysema. These results were called by telephone at the time of interpretation on 02/01/2014 at 10:44 pm to Dr. Nelva Nay, who verbally acknowledged these results.   Electronically Signed   By: Trudie Reed M.D.   On: 02/01/2014 22:45   Ct Chest W Contrast  02/02/2014   CLINICAL DATA:  Chest pain for the past 2 weeks. Cavitary pneumonia noted on recent chest x-ray.  EXAM: CT CHEST WITH CONTRAST  TECHNIQUE: Multidetector CT imaging of the chest was performed during intravenous contrast administration.  CONTRAST:  80mL OMNIPAQUE IOHEXOL 300 MG/ML  SOLN  COMPARISON:  No priors.  FINDINGS: Mediastinum: Heart size is normal. There is no  significant pericardial fluid, thickening or pericardial calcification. Multiple prominent but non pathologically enlarged mediastinal and left hilar lymph nodes are presumably reactive. Esophagus is unremarkable in appearance.  Lungs/Pleura: There is a background of severe  centrilobular and paraseptal emphysema. Diffuse bronchial wall thickening. Throughout the left upper lobe there is extensive airspace consolidation. Multiple small cavities are noted, largest of which is in the medial aspect of the left upper lobe measuring approximately 4.0 x 3.4 cm (image 16 of series 201). Sagittal reconstructions demonstrates significant posterior bowing of the left major fissure, compatible with necrotizing pneumonia. In the anterior aspect of the right upper lobe there is a 8 x 6 x 5 mm nodule with slightly spiculated margins. Trace left pleural effusion layering dependently.  Upper Abdomen: The visualized portions of the upper pole of the right kidney are markedly irregular with multifocal areas of cortical thinning and probable nonobstructive calculus in the upper pole collecting system measuring 2 mm.  Musculoskeletal: There are no aggressive appearing lytic or blastic lesions noted in the visualized portions of the skeleton.  IMPRESSION: 1. Necrotizing pneumonia in the left upper lobe with multiple small fluid-filled cavities. It is uncertain which of these are simply superinfected fluid-filled bulla and which of these are small pulmonary abscesses. 2. Advanced centrilobular and paraseptal emphysema. Per review of the medical record, this patient has only a 19 pack-year history of smoking. Given the severity of emphysematous disease in this patient, this suggests a potential genetic predilection for development of emphysema, and counseling for smoking cessation is strongly recommended in this young individual. 3. 8 x 6 x 5 mm nodule in the right upper lobe. Repeat chest CT is recommended in 6 months to ensure the  stability or resolution of this finding, as a small neoplasm is not excluded.   Electronically Signed   By: Trudie Reedaniel  Entrikin M.D.   On: 02/02/2014 01:48    Assessment: She appears to be improving with empiric antibacterial therapy for CAP/lung abscess. I think TB is unlikely but I would continue isolation for now. If she continues to improve and is unable to produce any sputum and blood cultures remain negative would consider transition her to oral amoxicillin clavulanate and doxycycline early next week. She will need several weeks of antibiotic therapy and close clinical and radiographic followup.  Plan: 1. Continue current antibiotics for now 2. Please call Dr. Merceda Elksob Comer [phone] for any infectious disease questions this weekend  Cliffton AstersJohn Jamise Pentland, MD [facility] [phone] pager   [phone] cell 02/03/2014, 12:43 PM

## 2014-02-03 NOTE — Progress Notes (Signed)
[facility] PROGRESS NOTE  Deanna Klein WUJ:[id number] DOB: 1975/10/07 DOA: 02/01/2014 PCP: No PCP Per Patient  Assessment/Plan: 1. Necrotizing pneumonia -Appreciate Pulm consult, ID consult -Day 2 Continue Vanc/Zosyn -Fu sputum/Blood Cx -FU sputum for AFB-suspicion low still pending , quantiferon gold  2. Severe COPD -duonebs QID -will need Spiriva, ICS Laba at discharge -FU alpha-1 antitrypsin deficiency phenotype  3. Tobacco abuse and cocaine abuse  -counseled  4. History of depression - continue home medications  5. Severe protein calorie malnutrition  -RD consult, supplement  6. Chronic hypotension -manual BP higher  7. Pulm nodule -needs FU  DVT proph: lovenox  Code Status: Full Code Family Communication: none at bedside Disposition Plan: Tx to floor  Consultants:  PCCM  Antibiotics:  Vanc/Zosyn  HPI/Subjective: Feels better, some cough,   Objective: Filed Vitals:   02/03/14 0732  BP: 100/64  Pulse:   Temp: 98.4 F (36.9 C)  Resp:     Intake/Output Summary (Last 24 hours) at 02/03/14 0744 Last data filed at 02/03/14 0733  Gross per 24 hour  Intake   3700 ml  Output   3855 ml  Net   -155 ml   Filed Weights   02/01/14 2210 02/02/14 0400  Weight: 46.72 kg (103 lb) 43.4 kg (95 lb 10.9 oz)    Exam:   General:  Frail, chronically  ill appearing, AAOx3  Cardiovascular: S1S2/RRR  Respiratory: poor air movement, scattered ronchi esp on L  Abdomen: soft, Nt, BS present  Musculoskeletal: no edema c/c   Data Reviewed: Basic Metabolic Panel:  Recent Labs Lab 02/01/14 2220 02/02/14 0710 02/03/14 0216  NA 134* 137 144  K 4.1 4.1 3.8  CL 99 108 108  CO2 22 19 23   GLUCOSE 109* 84 144*  BUN 10 9 6   CREATININE 0.81 0.79 0.70  CALCIUM 8.8 7.5* 8.0*   Liver Function Tests:  Recent Labs Lab 02/01/14 2220 02/02/14 0710  AST 10 8  ALT 6 <5  ALKPHOS 61 46  BILITOT 0.2* 0.2*  PROT 7.6 5.6*  ALBUMIN 2.7* 2.0*   No  results found for this basename: LIPASE, AMYLASE,  in the last 168 hours No results found for this basename: AMMONIA,  in the last 168 hours CBC:  Recent Labs Lab 02/01/14 2220 02/02/14 0710 02/03/14 0216  WBC 12.9* 10.1 9.0  NEUTROABS 8.6* 5.9  --   HGB 13.8 11.5* 11.0*  HCT 40.0 34.7* 32.9*  MCV 86.2 89.4 88.7  PLT 365 289 303   Cardiac Enzymes:  Recent Labs Lab 02/02/14 0710  TROPONINI <0.30   BNP (last 3 results)  Recent Labs  03/06/13 1911  PROBNP 63.7   CBG: No results found for this basename: GLUCAP,  in the last 168 hours  Recent Results (from the past 240 hour(s))  MRSA PCR SCREENING     Status: None   Collection Time    02/02/14  3:52 AM      Result Value Ref Range Status   MRSA by PCR NEGATIVE  NEGATIVE Final   Comment:            The GeneXpert MRSA Assay (FDA     approved for NASAL specimens     only), is one component of a     comprehensive MRSA colonization     surveillance program. It is not     intended to diagnose MRSA     infection nor to guide or     monitor treatment for  MRSA infections.     Studies: Dg Chest 2 View  02/01/2014   CLINICAL DATA:  Chest pain for the past 2 weeks.  Shortness breath.  EXAM: CHEST  2 VIEW  COMPARISON:  Chest x-ray 03/21/2013.  FINDINGS: Extensive airspace consolidation is noted in the left upper lobe. On the lateral view there is some posterior bulging of the superior aspect of the left major fissure, indicative of a necrotizing pneumonia. Additionally, there appears to be multiple areas of cavitation, including one area with a significant air-fluid level noted on the lateral projection. Right lung appears relatively clear. Emphysematous changes are again noted throughout the lungs bilaterally. Trace left pleural effusion. No evidence of pulmonary edema. Heart size is normal. Upper mediastinal contours are within normal limits.  IMPRESSION: 1. Severe left upper lobe necrotizing pneumonia with areas of  cavitation. This could represent infection with either typical or atypical organisms, including tuberculosis. Clinical correlation is recommended, with consideration for respiratory isolation if clinically appropriate. 2. Emphysema. These results were called by telephone at the time of interpretation on 02/01/2014 at 10:44 pm to Dr. Nelva NayOBERT BEATON, who verbally acknowledged these results.   Electronically Signed   By: Trudie Reedaniel  Entrikin M.D.   On: 02/01/2014 22:45   Ct Chest W Contrast  02/02/2014   CLINICAL DATA:  Chest pain for the past 2 weeks. Cavitary pneumonia noted on recent chest x-ray.  EXAM: CT CHEST WITH CONTRAST  TECHNIQUE: Multidetector CT imaging of the chest was performed during intravenous contrast administration.  CONTRAST:  80mL OMNIPAQUE IOHEXOL 300 MG/ML  SOLN  COMPARISON:  No priors.  FINDINGS: Mediastinum: Heart size is normal. There is no significant pericardial fluid, thickening or pericardial calcification. Multiple prominent but non pathologically enlarged mediastinal and left hilar lymph nodes are presumably reactive. Esophagus is unremarkable in appearance.  Lungs/Pleura: There is a background of severe centrilobular and paraseptal emphysema. Diffuse bronchial wall thickening. Throughout the left upper lobe there is extensive airspace consolidation. Multiple small cavities are noted, largest of which is in the medial aspect of the left upper lobe measuring approximately 4.0 x 3.4 cm (image 16 of series 201). Sagittal reconstructions demonstrates significant posterior bowing of the left major fissure, compatible with necrotizing pneumonia. In the anterior aspect of the right upper lobe there is a 8 x 6 x 5 mm nodule with slightly spiculated margins. Trace left pleural effusion layering dependently.  Upper Abdomen: The visualized portions of the upper pole of the right kidney are markedly irregular with multifocal areas of cortical thinning and probable nonobstructive calculus in the upper  pole collecting system measuring 2 mm.  Musculoskeletal: There are no aggressive appearing lytic or blastic lesions noted in the visualized portions of the skeleton.  IMPRESSION: 1. Necrotizing pneumonia in the left upper lobe with multiple small fluid-filled cavities. It is uncertain which of these are simply superinfected fluid-filled bulla and which of these are small pulmonary abscesses. 2. Advanced centrilobular and paraseptal emphysema. Per review of the medical record, this patient has only a 19 pack-year history of smoking. Given the severity of emphysematous disease in this patient, this suggests a potential genetic predilection for development of emphysema, and counseling for smoking cessation is strongly recommended in this young individual. 3. 8 x 6 x 5 mm nodule in the right upper lobe. Repeat chest CT is recommended in 6 months to ensure the stability or resolution of this finding, as a small neoplasm is not excluded.   Electronically Signed   By: Trudie Reedaniel  Entrikin  M.D.   On: 02/02/2014 01:48    Scheduled Meds: . ARIPiprazole  10 mg Oral QHS  . divalproex  500 mg Oral Daily  . enoxaparin (LOVENOX) injection  40 mg Subcutaneous Daily  . feeding supplement (ENSURE COMPLETE)  237 mL Oral TID BM  . guaiFENesin  600 mg Oral BID  . ipratropium-albuterol  3 mL Nebulization QID  . piperacillin-tazobactam (ZOSYN)  IV  3.375 g Intravenous 3 times per day  . tuberculin  5 Units Intradermal Once  . vancomycin  750 mg Intravenous Q12H   Continuous Infusions: . sodium chloride 100 mL/hr at 02/03/14 0126   Antibiotics Given (last 72 hours)   Date/Time Action Medication Dose Rate   02/02/14 0613 Given   clindamycin (CLEOCIN) IVPB 600 mg 600 mg 100 mL/hr   02/02/14 0703 Given   piperacillin-tazobactam (ZOSYN) IVPB 3.375 g 3.375 g 12.5 mL/hr   02/02/14 1201 Given   vancomycin (VANCOCIN) IVPB 750 mg/150 ml premix 750 mg 150 mL/hr   02/02/14 1402 Given   clindamycin (CLEOCIN) IVPB 600 mg 600 mg  100 mL/hr   02/02/14 1437 Given   piperacillin-tazobactam (ZOSYN) IVPB 3.375 g 3.375 g 12.5 mL/hr   02/02/14 2152 Given   piperacillin-tazobactam (ZOSYN) IVPB 3.375 g 3.375 g 12.5 mL/hr   02/03/14 0026 Given   vancomycin (VANCOCIN) IVPB 750 mg/150 ml premix 750 mg 150 mL/hr   02/03/14 0507 Given   piperacillin-tazobactam (ZOSYN) IVPB 3.375 g 3.375 g 12.5 mL/hr      Principal Problem:   Necrotizing pneumonia Active Problems:   Smoking   Protein-calorie malnutrition, severe   COPD (chronic obstructive pulmonary disease)   Seizure disorder   Depression   Polysubstance abuse   Normocytic anemia    Time spent:    [facility]  [facility] Pager [phone]. If 7PM-7AM, please contact night-coverage at [email or web address], password [facility] 02/03/2014, 7:44 AM  LOS: 2 days

## 2014-02-03 NOTE — Consult Note (Signed)
Name: Deanna Klein MRN: [id number] DOB: 01-06-1976    ADMISSION DATE:  02/01/2014 CONSULTATION DATE:  02/02/2014  REFERRING MD :  Toniann Fail PRIMARY SERVICE:  [facility]  CHIEF COMPLAINT:  PNA, ? necrotizing  BRIEF PATIENT DESCRIPTION: 38 y.o. F with PMH of depression, substance abuse, seizures, b/l pneumothorax 1 year ago, presents to ED with SOB, myalgias x 2 weeks.  In ED, CXR concerning for necrotizing PNA.  CT obtained which revealed   SIGNIFICANT EVENTS / STUDIES:  CXR 8/12 >>> severe LUL necrotizing PNA with areas of cavitation, emphysema. CT Chest 8/12 >>> necrotizing PNA in LUL with multiple small fluid-filled cavities, unclear whether these are superinfected fluid filled bulla or small abscesses.  Advanced cetrilobar and paraseptal emphysema.  3.8 x 6 x 5mm nodule in RUL. A1AT level and phenotype 8/12 >>>  LINES / TUBES: PIV  CULTURES: Blood 8/12 >>> AFB Mycobacterium 8/12 >>> U. Strep >>> U. Legionella >>>  ANTIBIOTICS: Vanc 8/12 >>> zosyn 8/12 >>> Clinda 8/12 >>>  SUBJECTIVE:  No distress  VITAL SIGNS: Temp:  [97.9 F (36.6 C)-98.5 F (36.9 C)] 98.4 F (36.9 C) (08/14 0732) Pulse Rate:  [84-105] 85 (08/14 0800) Resp:  [17-28] 17 (08/14 0800) BP: (73-108)/(39-64) 100/64 mmHg (08/14 0732) SpO2:  [91 %-100 %] 99 % (08/14 0845)  PHYSICAL EXAMINATION: General: in bed , no distress Neuro: A&O x 3, non-focal  HEENT: no fullness mandible Cardiovascular: RRR, no M/R/G.  Lungs: ronchi Abdomen: BS x 4, soft, NT/ND.  Musculoskeletal: No gross deformities, no edema.  Skin: Intact, warm, no rashes.   Recent Labs Lab 02/01/14 2220 02/02/14 0710 02/03/14 0216  NA 134* 137 144  K 4.1 4.1 3.8  CL 99 108 108  CO2 22 19 23   BUN 10 9 6   CREATININE 0.81 0.79 0.70  GLUCOSE 109* 84 144*    Recent Labs Lab 02/01/14 2220 02/02/14 0710 02/03/14 0216  HGB 13.8 11.5* 11.0*  HCT 40.0 34.7* 32.9*  WBC 12.9* 10.1 9.0  PLT 365 289 303   Dg Chest 2  View  02/01/2014   CLINICAL DATA:  Chest pain for the past 2 weeks.  Shortness breath.  EXAM: CHEST  2 VIEW  COMPARISON:  Chest x-ray 03/21/2013.  FINDINGS: Extensive airspace consolidation is noted in the left upper lobe. On the lateral view there is some posterior bulging of the superior aspect of the left major fissure, indicative of a necrotizing pneumonia. Additionally, there appears to be multiple areas of cavitation, including one area with a significant air-fluid level noted on the lateral projection. Right lung appears relatively clear. Emphysematous changes are again noted throughout the lungs bilaterally. Trace left pleural effusion. No evidence of pulmonary edema. Heart size is normal. Upper mediastinal contours are within normal limits.  IMPRESSION: 1. Severe left upper lobe necrotizing pneumonia with areas of cavitation. This could represent infection with either typical or atypical organisms, including tuberculosis. Clinical correlation is recommended, with consideration for respiratory isolation if clinically appropriate. 2. Emphysema. These results were called by telephone at the time of interpretation on 02/01/2014 at 10:44 pm to Dr. Nelva Nay, who verbally acknowledged these results.   Electronically Signed   By: Trudie Reed M.D.   On: 02/01/2014 22:45   Ct Chest W Contrast  02/02/2014   CLINICAL DATA:  Chest pain for the past 2 weeks. Cavitary pneumonia noted on recent chest x-ray.  EXAM: CT CHEST WITH CONTRAST  TECHNIQUE: Multidetector CT imaging of the chest was performed during intravenous contrast  administration.  CONTRAST:  80mL OMNIPAQUE IOHEXOL 300 MG/ML  SOLN  COMPARISON:  No priors.  FINDINGS: Mediastinum: Heart size is normal. There is no significant pericardial fluid, thickening or pericardial calcification. Multiple prominent but non pathologically enlarged mediastinal and left hilar lymph nodes are presumably reactive. Esophagus is unremarkable in appearance.   Lungs/Pleura: There is a background of severe centrilobular and paraseptal emphysema. Diffuse bronchial wall thickening. Throughout the left upper lobe there is extensive airspace consolidation. Multiple small cavities are noted, largest of which is in the medial aspect of the left upper lobe measuring approximately 4.0 x 3.4 cm (image 16 of series 201). Sagittal reconstructions demonstrates significant posterior bowing of the left major fissure, compatible with necrotizing pneumonia. In the anterior aspect of the right upper lobe there is a 8 x 6 x 5 mm nodule with slightly spiculated margins. Trace left pleural effusion layering dependently.  Upper Abdomen: The visualized portions of the upper pole of the right kidney are markedly irregular with multifocal areas of cortical thinning and probable nonobstructive calculus in the upper pole collecting system measuring 2 mm.  Musculoskeletal: There are no aggressive appearing lytic or blastic lesions noted in the visualized portions of the skeleton.  IMPRESSION: 1. Necrotizing pneumonia in the left upper lobe with multiple small fluid-filled cavities. It is uncertain which of these are simply superinfected fluid-filled bulla and which of these are small pulmonary abscesses. 2. Advanced centrilobular and paraseptal emphysema. Per review of the medical record, this patient has only a 19 pack-year history of smoking. Given the severity of emphysematous disease in this patient, this suggests a potential genetic predilection for development of emphysema, and counseling for smoking cessation is strongly recommended in this young individual. 3. 8 x 6 x 5 mm nodule in the right upper lobe. Repeat chest CT is recommended in 6 months to ensure the stability or resolution of this finding, as a small neoplasm is not excluded.   Electronically Signed   By: Trudie Reedaniel  Entrikin M.D.   On: 02/02/2014 01:48    ASSESSMENT / PLAN:  CAP - concern for necrotizing PNA per radiology  report.  Given similar pattern of emphysema/bullae in right lung, could the appearance of fluid-filled cavities in left actually be PNA that is simply mixed in and superimposed on significant bullae? Significant emphysema - in a 38 y.o. Pt with < 20 pack year smoking history, concern for A1AT deficiency. Small RUL pulmonary nodule R/o TB (low clinical suspicion) Recent tooth extraction Recs: Empiric abx (Vanc, zosyn) Agree dc clinda Send A1AT level and phenotype. Send AFB for Mycobacterium > low suspicion Panorex film when able, ensure no residual driving abscess quantiferon gold pending Await parorex ensure no abscess, hope can be done this weekend Remains on RA Will follow up Monday Need to read PPD  Will consider repeat CT in 4-6 weeks  Mcarthur Rossettianiel J. Tyson AliasFeinstein, MD, FACP Pgr: [phone] [facility]

## 2014-02-03 NOTE — Progress Notes (Signed)
Transfer note:  Arrival Method: bed from stepdown  Mental Orientation: A & O x 4  Telemetry: none ordered  Assessment: See Doc Flowsheets  Skin: assessed and intact  IV: Right arm, Normal saline running at 75/hr  Pain: denies   Safety Measures: Non- slip socks on patient.  Patient demonstrated use of call bell for bathroom assistance.  No bed alarm needed.    Fall Prevention Safety Plan: educated patient on fall prevention safety plan.  Patient verbalized understanding and signed  6700 Orientation: Patient has been oriented to the unit, staff and to the room.

## 2014-02-04 MED ORDER — AMOXICILLIN-POT CLAVULANATE 875-125 MG PO TABS: 1.0000 | ORAL_TABLET | Freq: Two times a day (BID) | ORAL | Status: DC

## 2014-02-04 MED ORDER — DOXYCYCLINE HYCLATE 100 MG PO TABS: 100.0000 mg | ORAL_TABLET | Freq: Two times a day (BID) | ORAL | Status: DC

## 2014-02-04 NOTE — Progress Notes (Signed)
Patient left Against Medical Advice.  AMA papers signed.  Patient educated on risks and verbalized understanding.  Patient educated on signs and symptoms of worsening pneumonia and when to call a doctor.  Patient verbalized understanding.  Prescriptions for antibiotics given to patient.  Gathered belongings and escorted to ride via wheelchair with NT.

## 2014-02-06 LAB — ALPHA-1-ANTITRYPSIN: A1 ANTITRYPSIN SER: 202 mg/dL — AB (ref 83–199)

## 2014-02-06 NOTE — ED Provider Notes (Signed)
Medical screening examination/treatment/procedure(s) were performed by non-physician practitioner and as supervising physician I was immediately available for consultation/collaboration.    Gaby Harney L Demarko Zeimet, MD 02/06/14 0738 

## 2014-02-07 LAB — ALPHA-1 ANTITRYPSIN PHENOTYPE: A-1 Antitrypsin: 196 mg/dL (ref 83–199)

## 2014-02-08 LAB — CULTURE, BLOOD (ROUTINE X 2)
CULTURE: NO GROWTH
CULTURE: NO GROWTH

## 2014-03-02 NOTE — Discharge Summary (Addendum)
Physician Discharge Summary  Deanna Klein:[id number] DOB: 06-27-1975 DOA: 02/01/2014  PCP: No PCP Per Patient  Admit date: 02/01/2014 Discharge date: 03/02/2014  Time spent:35 minutes  LEFT AMA  Discharge Diagnoses:  Principal Problem:   Necrotizing pneumonia Active Problems:   Smoking   Protein-calorie malnutrition, severe   COPD (chronic obstructive pulmonary disease)   Seizure disorder   Depression   Polysubstance abuse   Normocytic anemia    Filed Weights   02/01/14 2210 02/02/14 0400 02/03/14 2143  Weight: 46.72 kg (103 lb) 43.4 kg (95 lb 10.9 oz) 46.131 kg (101 lb 11.2 oz)    History of present illness:  Deanna Klein is a 38 y.o. female with previous history of spontaneous pneumothorax last year requiring chest tube placement, polysubstance abuse and depression presents to the ER with complaints of left-sided pleuritic-type chest pain for last 3 weeks. Patient has been having productive cough for last 2 weeks denies any hemoptysis. Patient has been having subjective feeling of fever chills and sweats. Denies any recent travel or contact with tuberculosis. In the ER chest x-ray shows left upper lobe necrotizing pneumonia. Patient had blood cultures drawn and started on empiric antibiotics. Patient otherwise denies any nausea vomiting abdominal pain headache visual symptoms. Patient was recently placed on amoxicillin for tooth extraction 3 weeks ago for caried tooth.   Hospital Course:    1. Necrotizing pneumonia -seen by Pulm and ID in consult  -treated with IV Vanc/Zosyn -Fu sputum/Blood Cx -pending -FU sputum for AFB-suspicion low , quantiferon gold pending -LEFT AMA on 8/15, given prescriptions for Augmentin and Doxycycline when she left  2. Severe COPD  -duonebs QID  -will need Spiriva, ICS Laba at discharge   3. Tobacco abuse and polysubstance abuse  -counseled, using cocaine   4. History of depression - continue home medications   5. Severe  protein calorie malnutrition  -RD consulted  6. Chronic hypotension  -manual BP higher  Consultations:  ID  Pulm  Discharge Exam: Filed Vitals:   02/04/14 0555  BP: 105/68  Pulse: 90  Temp: 97.6 F (36.4 C)  Resp: 20    Discharge Instructions You were cared for by a hospitalist during your hospital stay. If you have any questions about your discharge medications or the care you received while you were in the hospital after you are discharged, you can call the unit and asked to speak with the hospitalist on call if the hospitalist that took care of you is not available. Once you are discharged, your primary care physician will handle any further medical issues. Please note that NO REFILLS for any discharge medications will be authorized once you are discharged, as it is imperative that you return to your primary care physician (or establish a relationship with a primary care physician if you do not have one) for your aftercare needs so that they can reassess your need for medications and monitor your lab values.   Discharge Medication List as of 02/04/2014 12:05 PM    START taking these medications   Details  amoxicillin-clavulanate (AUGMENTIN) 875-125 MG per tablet Take 1 tablet by mouth 2 (two) times daily. For 4-6weeks, Starting 02/04/2014, Until Discontinued, Print    doxycycline (VIBRA-TABS) 100 MG tablet Take 1 tablet (100 mg total) by mouth 2 (two) times daily. For 4-6weeks, Starting 02/04/2014, Until Discontinued, Print      CONTINUE these medications which have NOT CHANGED   Details  ARIPiprazole (ABILIFY) 10 MG tablet Take 10 mg by  mouth at bedtime. , Until Discontinued, Historical Med    divalproex (DEPAKOTE) 500 MG DR tablet Take 500 mg by mouth daily. , Until Discontinued, Historical Med       Allergies  Allergen Reactions  . Darvocet [Propoxyphene N-Acetaminophen] Hives  . Percocet [Oxycodone-Acetaminophen] Hives      The results of significant diagnostics  from this hospitalization (including imaging, microbiology, ancillary and laboratory) are listed below for reference.    Significant Diagnostic Studies: Dg Chest 2 View  02/01/2014   CLINICAL DATA:  Chest pain for the past 2 weeks.  Shortness breath.  EXAM: CHEST  2 VIEW  COMPARISON:  Chest x-ray 03/21/2013.  FINDINGS: Extensive airspace consolidation is noted in the left upper lobe. On the lateral view there is some posterior bulging of the superior aspect of the left major fissure, indicative of a necrotizing pneumonia. Additionally, there appears to be multiple areas of cavitation, including one area with a significant air-fluid level noted on the lateral projection. Right lung appears relatively clear. Emphysematous changes are again noted throughout the lungs bilaterally. Trace left pleural effusion. No evidence of pulmonary edema. Heart size is normal. Upper mediastinal contours are within normal limits.  IMPRESSION: 1. Severe left upper lobe necrotizing pneumonia with areas of cavitation. This could represent infection with either typical or atypical organisms, including tuberculosis. Clinical correlation is recommended, with consideration for respiratory isolation if clinically appropriate. 2. Emphysema. These results were called by telephone at the time of interpretation on 02/01/2014 at 10:44 pm to Dr. Nelva Nay, who verbally acknowledged these results.   Electronically Signed   By: Trudie Reed M.D.   On: 02/01/2014 22:45   Ct Chest W Contrast  02/02/2014   CLINICAL DATA:  Chest pain for the past 2 weeks. Cavitary pneumonia noted on recent chest x-ray.  EXAM: CT CHEST WITH CONTRAST  TECHNIQUE: Multidetector CT imaging of the chest was performed during intravenous contrast administration.  CONTRAST:  80mL OMNIPAQUE IOHEXOL 300 MG/ML  SOLN  COMPARISON:  No priors.  FINDINGS: Mediastinum: Heart size is normal. There is no significant pericardial fluid, thickening or pericardial calcification.  Multiple prominent but non pathologically enlarged mediastinal and left hilar lymph nodes are presumably reactive. Esophagus is unremarkable in appearance.  Lungs/Pleura: There is a background of severe centrilobular and paraseptal emphysema. Diffuse bronchial wall thickening. Throughout the left upper lobe there is extensive airspace consolidation. Multiple small cavities are noted, largest of which is in the medial aspect of the left upper lobe measuring approximately 4.0 x 3.4 cm (image 16 of series 201). Sagittal reconstructions demonstrates significant posterior bowing of the left major fissure, compatible with necrotizing pneumonia. In the anterior aspect of the right upper lobe there is a 8 x 6 x 5 mm nodule with slightly spiculated margins. Trace left pleural effusion layering dependently.  Upper Abdomen: The visualized portions of the upper pole of the right kidney are markedly irregular with multifocal areas of cortical thinning and probable nonobstructive calculus in the upper pole collecting system measuring 2 mm.  Musculoskeletal: There are no aggressive appearing lytic or blastic lesions noted in the visualized portions of the skeleton.  IMPRESSION: 1. Necrotizing pneumonia in the left upper lobe with multiple small fluid-filled cavities. It is uncertain which of these are simply superinfected fluid-filled bulla and which of these are small pulmonary abscesses. 2. Advanced centrilobular and paraseptal emphysema. Per review of the medical record, this patient has only a 19 pack-year history of smoking. Given the severity of emphysematous disease  in this patient, this suggests a potential genetic predilection for development of emphysema, and counseling for smoking cessation is strongly recommended in this young individual. 3. 8 x 6 x 5 mm nodule in the right upper lobe. Repeat chest CT is recommended in 6 months to ensure the stability or resolution of this finding, as a small neoplasm is not excluded.    Electronically Signed   By: Trudie Reed M.D.   On: 02/02/2014 01:48    Microbiology: No results found for this or any previous visit (from the past 240 hour(s)).   Labs: Basic Metabolic Panel: No results found for this basename: NA, K, CL, CO2, GLUCOSE, BUN, CREATININE, CALCIUM, MG, PHOS,  in the last 168 hours Liver Function Tests: No results found for this basename: AST, ALT, ALKPHOS, BILITOT, PROT, ALBUMIN,  in the last 168 hours No results found for this basename: LIPASE, AMYLASE,  in the last 168 hours No results found for this basename: AMMONIA,  in the last 168 hours CBC: No results found for this basename: WBC, NEUTROABS, HGB, HCT, MCV, PLT,  in the last 168 hours Cardiac Enzymes: No results found for this basename: CKTOTAL, CKMB, CKMBINDEX, TROPONINI,  in the last 168 hours BNP: BNP (last 3 results)  Recent Labs  03/06/13 1911  PROBNP 63.7   CBG: No results found for this basename: GLUCAP,  in the last 168 hours     Signed:  Anaily Ashbaugh  [facility] 03/02/2014, 10:18 AM

## 2014-03-15 ENCOUNTER — Other Ambulatory Visit: Payer: Self-pay

## 2014-03-16 LAB — CYTOLOGY - PAP

## 2014-03-17 ENCOUNTER — Encounter (HOSPITAL_COMMUNITY): Payer: Self-pay | Admitting: Emergency Medicine

## 2014-03-17 ENCOUNTER — Emergency Department (HOSPITAL_COMMUNITY): Payer: Medicare Other

## 2014-03-17 ENCOUNTER — Emergency Department (HOSPITAL_COMMUNITY)
Admission: EM | Admit: 2014-03-17 | Discharge: 2014-03-18 | Disposition: A | Discharge status: Home / Self Care | Payer: Medicare Other | Attending: Emergency Medicine | Admitting: Emergency Medicine

## 2014-03-17 DIAGNOSIS — Z79899 Other long term (current) drug therapy: Secondary | ICD-10-CM | POA: Insufficient documentation

## 2014-03-17 DIAGNOSIS — Z792 Long term (current) use of antibiotics: Secondary | ICD-10-CM | POA: Insufficient documentation

## 2014-03-17 DIAGNOSIS — F17200 Nicotine dependence, unspecified, uncomplicated: Secondary | ICD-10-CM | POA: Diagnosis not present

## 2014-03-17 DIAGNOSIS — R197 Diarrhea, unspecified: Secondary | ICD-10-CM | POA: Diagnosis not present

## 2014-03-17 DIAGNOSIS — K625 Hemorrhage of anus and rectum: Secondary | ICD-10-CM | POA: Insufficient documentation

## 2014-03-17 DIAGNOSIS — Z8669 Personal history of other diseases of the nervous system and sense organs: Secondary | ICD-10-CM | POA: Insufficient documentation

## 2014-03-17 DIAGNOSIS — Z8659 Personal history of other mental and behavioral disorders: Secondary | ICD-10-CM | POA: Insufficient documentation

## 2014-03-17 LAB — COMPREHENSIVE METABOLIC PANEL
ALBUMIN: 3.7 g/dL (ref 3.5–5.2)
ALK PHOS: 54 U/L (ref 39–117)
ALK PHOS: 57 U/L (ref 39–117)
ALT: 20 U/L (ref 0–35)
ALT: 19 U/L (ref 0–35)
ANION GAP: 14 (ref 5–15)
AST: 30 U/L (ref 0–37)
AST: 21 U/L (ref 0–37)
Albumin: 3.5 g/dL (ref 3.5–5.2)
Anion gap: 11 (ref 5–15)
BILIRUBIN TOTAL: 0.3 mg/dL (ref 0.3–1.2)
BUN: 11 mg/dL (ref 6–23)
BUN: 11 mg/dL (ref 6–23)
CHLORIDE: 100 meq/L (ref 96–112)
CO2: 20 meq/L (ref 19–32)
CO2: 24 mEq/L (ref 19–32)
CREATININE: 0.8 mg/dL (ref 0.50–1.10)
Calcium: 9.1 mg/dL (ref 8.4–10.5)
Calcium: 9.2 mg/dL (ref 8.4–10.5)
Chloride: 101 mEq/L (ref 96–112)
Creatinine, Ser: 0.76 mg/dL (ref 0.50–1.10)
GFR calc Af Amer: >90 mL/min (ref >90)
GFR calc Af Amer: >90 mL/min (ref >90)
GFR calc non Af Amer: >90 mL/min (ref >90)
GLUCOSE: 128 mg/dL — AB (ref 70–99)
Glucose, Bld: 64 mg/dL — ABNORMAL LOW (ref 70–99)
POTASSIUM: 4.4 meq/L (ref 3.7–5.3)
POTASSIUM: 3.4 meq/L — AB (ref 3.7–5.3)
Sodium: 134 mEq/L — ABNORMAL LOW (ref 137–147)
Sodium: 136 mEq/L — ABNORMAL LOW (ref 137–147)
TOTAL PROTEIN: 7.5 g/dL (ref 6.0–8.3)
Total Bilirubin: 0.3 mg/dL (ref 0.3–1.2)
Total Protein: 7.4 g/dL (ref 6.0–8.3)

## 2014-03-17 LAB — CBC WITH DIFFERENTIAL/PLATELET
BASOS PCT: 0 % (ref 0–1)
Basophils Absolute: 0 10*3/uL (ref 0.0–0.1)
EOS ABS: 0.1 10*3/uL (ref 0.0–0.7)
Eosinophils Relative: 1 % (ref 0–5)
HCT: 42.6 % (ref 36.0–46.0)
HEMOGLOBIN: 14.6 g/dL (ref 12.0–15.0)
Lymphocytes Relative: 24 % (ref 12–46)
Lymphs Abs: 2.8 10*3/uL (ref 0.7–4.0)
MCH: 29.1 pg (ref 26.0–34.0)
MCHC: 34.3 g/dL (ref 30.0–36.0)
MCV: 84.9 fL (ref 78.0–100.0)
Monocytes Absolute: 0.9 10*3/uL (ref 0.1–1.0)
Monocytes Relative: 8 % (ref 3–12)
NEUTROS PCT: 67 % (ref 43–77)
Neutro Abs: 7.7 10*3/uL (ref 1.7–7.7)
Platelets: 268 10*3/uL (ref 150–400)
RBC: 5.02 MIL/uL (ref 3.87–5.11)
RDW: 13.7 % (ref 11.5–15.5)
WBC: 11.5 10*3/uL — ABNORMAL HIGH (ref 4.0–10.5)

## 2014-03-17 LAB — POC OCCULT BLOOD, ED: Fecal Occult Bld: POSITIVE — AB

## 2014-03-17 MED ORDER — METRONIDAZOLE 500 MG PO TABS
500.0000 mg | ORAL_TABLET | Freq: Once | ORAL | Status: AC
  Administered 2014-03-17: 500 mg via ORAL
  Filled 2014-03-17: qty 1

## 2014-03-17 MED ORDER — METRONIDAZOLE 500 MG PO TABS: 500.0000 mg | ORAL_TABLET | Freq: Three times a day (TID) | ORAL | Status: DC

## 2014-03-17 NOTE — ED Notes (Signed)
Patient transported to X-ray 

## 2014-03-17 NOTE — ED Notes (Signed)
Stating, that she is "going out to smoke". Discouraged.  Wait, plan and process explained.

## 2014-03-17 NOTE — ED Notes (Addendum)
Occult blood test positive.  Did not cross over in the computer.

## 2014-03-17 NOTE — ED Notes (Signed)
Pt. Reports bright red blood in stool/diarrhea x1-2 weeks, denies blood clots. States "now the blood is just coming out without the stool". Pt. States "my PCP thinks it's from the antibiotics I am taking for my pneumonia".  Reports N/V and fatigue that started today. Denies blood in emesis. Also reports abdominal cramping. Alert and oriented x4.

## 2014-03-17 NOTE — ED Notes (Signed)
The pt is c/o bright red blood in her stool for 1-2 weeks.  She has had diarrhea for the same amount of time.  She denies hemorrhoids .  abd cramps.   Pneumonia  Dx in the past 2-3 months.  lmp 3 weeks ago

## 2014-03-17 NOTE — ED Provider Notes (Signed)
CSN: [id number]     Arrival date & time 03/17/14  1940 History   First MD Initiated Contact with Patient 03/17/14 2146     Chief Complaint  Patient presents with  . Rectal Bleeding      HPI The pt is c/o bright red blood in her stool for 1-2 weeks. She has had diarrhea for the same amount of time. She denies hemorrhoids . abd cramps. Pneumonia Dx in the past 2-3 months. lmp 3 weeks ago Patient admits to doing cocaine. Past Medical History  Diagnosis Date  . Depression   . Substance abuse     crack cocaine  . Seizures    Past Surgical History  Procedure Laterality Date  . Right lung collapse     No family history on file. History  Substance Use Topics  . Smoking status: Current Every Day Smoker -- 1.00 packs/day for 19 years    Types: Cigarettes  . Smokeless tobacco: Never Used  . Alcohol Use: No   OB History   Grav Para Term Preterm Abortions TAB SAB Ect Mult Living   4 3 3  1  1   3      Review of Systems  All other systems reviewed and are negative  Allergies  Darvocet and Percocet  Home Medications   Prior to Admission medications   Medication Sig Start Date End Date Taking? Authorizing Provider  amoxicillin-clavulanate (AUGMENTIN) 875-125 MG per tablet Take 1 tablet by mouth 2 (two) times daily. For 4-6weeks 02/04/14  Yes Zannie Cove, MD  doxycycline (VIBRA-TABS) 100 MG tablet Take 1 tablet (100 mg total) by mouth 2 (two) times daily. For 4-6weeks 02/04/14  Yes Zannie Cove, MD  metroNIDAZOLE (FLAGYL) 500 MG tablet Take 1 tablet (500 mg total) by mouth 3 (three) times daily. 03/17/14   Nelia Shi, MD   BP 96/71  Pulse 93  Temp(Src) 99 F (37.2 C) (Oral)  Resp 23  SpO2 98%  LMP 02/24/2014 Physical Exam Physical Exam  Nursing note and vitals reviewed. Constitutional: She is oriented to person, place, and time. She appears well-developed and well-nourished. No distress.  HENT:  Head: Normocephalic and atraumatic.  Eyes: Pupils are equal, round,  and reactive to light.  Neck: Normal range of motion.  Cardiovascular: Normal rate and intact distal pulses.   Pulmonary/Chest: No respiratory distress.  Abdominal: Normal appearance. She exhibits no distension.  Musculoskeletal: Normal range of motion.  Neurological: She is alert and oriented to person, place, and time. No cranial nerve deficit.  Skin: Skin is warm and dry. No rash noted.  Psychiatric: She has a normal mood and affect. Her behavior is normal.   ED Course  Procedures (including critical care time) Labs Review Labs Reviewed  COMPREHENSIVE METABOLIC PANEL - Abnormal; Notable for the following:    Sodium 134 (*)    Glucose, Bld 128 (*)    All other components within normal limits  CBC WITH DIFFERENTIAL - Abnormal; Notable for the following:    WBC 11.5 (*)    All other components within normal limits  COMPREHENSIVE METABOLIC PANEL - Abnormal; Notable for the following:    Sodium 136 (*)    Potassium 3.4 (*)    Glucose, Bld 64 (*)    All other components within normal limits  POC OCCULT BLOOD, ED - Abnormal; Notable for the following:    Fecal Occult Bld POSITIVE (*)    All other components within normal limits  CBC WITH DIFFERENTIAL    Imaging  Review Dg Chest 2 View  03/17/2014   CLINICAL DATA:  Rectal bleeding with diarrhea, dehydration  EXAM: CHEST  2 VIEW  COMPARISON:  CT chest 02/02/2014  FINDINGS: Left upper lobe airspace disease most concerning for pneumonia. No other focal parenchymal opacity. No pleural effusion or pneumothorax. Stable cardiomediastinal silhouette. Unremarkable osseous structures.  IMPRESSION: Left upper lobe pneumonia.   Electronically Signed   By: Elige Ko   On: 03/17/2014 22:38    Patient is hemoglobin was 14.6.  She had been on antibiotics for quite some time I suspect she's got C. difficile.  Was unable to get a stool culture in the emergency room.  Will start patient empirically on Flagyl.  Patient states she wants to go home does  not want to be admitted.  MDM   Final diagnoses:  Diarrhea        Nelia Shi, MD 03/18/14 [phone]

## 2014-03-17 NOTE — Discharge Instructions (Signed)
Bloody Diarrhea °Bloody diarrhea can be caused by many different conditions. Most of the time bloody diarrhea is the result of food poisoning or minor infections. Bloody diarrhea usually improves over 2 to 3 days of rest and fluid replacement. Other conditions that can cause bloody diarrhea include: °· Internal bleeding. °· Infection. °· Diseases of the bowel and colon. °Internal bleeding from an ulcer or bowel disease can be severe and requires hospital care or even surgery. °DIAGNOSIS  °To find out what is wrong your caregiver may check your: °· Stool. °· Blood. °· Results from a test that looks inside the body (endoscopy). °TREATMENT  °· Get plenty of rest. °· Drink enough water and fluids to keep your urine clear or pale yellow. °· Do not smoke. °· Solid foods and dairy products should be avoided until your illness improves. °· As you improve, slowly return to a regular diet with easily-digested foods first. Examples are: °¨ Bananas. °¨ Rice. °¨ Toast. °¨ Crackers. °You should only need these for about 2 days before adding more normal foods to your diet. °· Avoid spicy or fatty foods as well as caffeine and alcohol for several days. °· Medicine to control cramping and diarrhea can relieve symptoms but may prolong some cases of bloody diarrhea. Antibiotics can speed recovery from diarrhea due to some bacterial infections. Call your caregiver if diarrhea does not get better in 3 days. °SEEK MEDICAL CARE IF:  °· You do not improve after 3 days. °· Your diarrhea improves but your stool appears black. °SEEK IMMEDIATE MEDICAL CARE IF:  °· You become extremely weak or faint. °· You become very sweaty. °· You have increased pain or bleeding. °· You develop repeated vomiting. °· You vomit and you see blood or the vomit looks black in color. °· You have a fever. °Document Released: 06/09/2005 Document Revised: 09/01/2011 Document Reviewed: 05/11/2009 °ExitCare® Patient Information ©2015 [employer]. This information is  not intended to replace advice given to you by your health care provider. Make sure you discuss any questions you have with your health care provider. ° °Emergency Department Resource Guide °1) Find a Doctor and Pay Out of Pocket °Although you won't have to find out who is covered by your insurance plan, it is a good idea to ask around and get recommendations. You will then need to call the office and see if the doctor you have chosen will accept you as a new patient and what types of options they offer for patients who are self-pay. Some doctors offer discounts or will set up payment plans for their patients who do not have insurance, but you will need to ask so you aren't surprised when you get to your appointment. ° °2) Contact Your Local Health Department °Not all health departments have doctors that can see patients for sick visits, but many do, so it is worth a call to see if yours does. If you don't know where your local health department is, you can check in your phone book. The CDC also has a tool to help you locate your state's health department, and many state websites also have listings of all of their local health departments. ° °3) Find a Walk-in Clinic °If your illness is not likely to be very severe or complicated, you may want to try a walk in clinic. These are popping up all over the country in pharmacies, drugstores, and shopping centers. They're usually staffed by nurse practitioners or physician assistants that have been trained to treat   common illnesses and complaints. They're usually fairly quick and inexpensive. However, if you have serious medical issues or chronic medical problems, these are probably not your best option. ° °No Primary Care Doctor: °- Call [facility] at  832-8000 - they can help you locate a primary care doctor that  accepts your insurance, provides certain services, etc. °- Physician Referral Service- 1-800-533-3463 ° °Chronic Pain Problems: °Organization          Address  Phone   Notes  °[facility]  (336) 297-2271 Patients need to be referred by their primary care doctor.  ° °Medication Assistance: °Organization         Address  Phone   Notes  °[place] [facility] [address] °[place], [place] [address] (336) 641-8030 --Must be a resident of [place] °-- Must have NO insurance coverage whatsoever (no Medicaid/ Medicare, etc.) °-- The pt. MUST have a primary care doctor that directs their care regularly and follows them in the community °  °[facility]  (866) 331-1348   °[employer]  (888) 892-1162   ° °Agencies that provide inexpensive medical care: °Organization         Address  Phone   Notes  °[facility]  (336) 832-8035   °[facility]    (336) 832-7272   °[facility] [address] °[place], [place] [address] (336) 832-4777   °[facility] [address], °[place] (336) 271-4999   °[facility]    (336) 373-0678   °[facility]    (336) 272-1050   °[facility] ° [address], [place] Phone:  (336) 832-4444, Fax:  (336) 832-4440 Hours of Operation:  9 am - 6 pm, M-F.  Also accepts Medicaid/Medicare and self-pay.  °[facility] ° [address], [place] Phone: (336) 832-3150, Fax: (336) 832-3151. Hours of Operation:  8:30 am - 5:30 pm, M-F.  Also accepts Medicaid and self-pay.  °[facility] [place] [address], [place] Phone: (336) 878-6027   °[facility] [address], [place], [place] (336)723-1848, Ext. 123 Mondays & Thursdays: 7-9 AM.  First 15 patients are seen on a first come, first serve basis. °  ° °Medicaid-accepting [place] Providers: ° °Organization         Address  Phone   Notes  °[facility] [address], [place] (336) 641-2100 Also accepts self-pay patients.  °[facility] [address], [place] ° (336) 856-9996   °[facility] [address], [place] (336) 288-8857   °[facility] [address] [place] [address], [place] (336) 299-7000   °Veita Bland [address], [place]  ° (336) 373-1557 Only accepts [place] Access Medicaid patients after they have their name applied to their card.  ° °Self-Pay (no insurance) in [place]: ° °Organization         Address  Phone   Notes  °Sickle Cell Patients, [facility] [address], [place] (336) 832-1970   °[facility] [address], [place] (336) 832-4400   °[facility]  ° [address],  (336) 992-4800   °[facility]/Dr. Osei-Bonsu ° [address] [place] [address], [place] or [address], [place] (336) 841-8500 Phone number for both [place] and [place] locations is the same.  °[facility] [address], [place] (336) 299-0000   °[facility] [address] [place] [address], [place] or [address] (336) 852-7530 °(336) 878-2260   °[facility] [address], [place] (336) 350-1642, phone; (336) 294-5005, fax Sees patients 1st and 3rd Saturday of every month.  Must not qualify for public or private insurance (i.e. Medicaid, Medicare, [place] Health Choice, Veterans' Benefits) • Household income should be no more than 200% of the poverty level •The clinic cannot treat you if you are pregnant or think you are pregnant • Sexually transmitted diseases are not treated at the clinic.  ° ° °Dental Care: °Organization         Address  Phone  Notes  °[facility] [address], [place] (336) 641-6152 Accepts children up to age 21 who are enrolled in Medicaid or [place] Health Choice; pregnant women with a Medicaid card; and  children who have applied for Medicaid or [place] Health Choice, but were declined, whose parents can pay a reduced fee at time of service.  °[facility] [place]  [address], [place] (336) 641-7733 Accepts children up to age 21 who are enrolled in Medicaid or [place] Health Choice; pregnant women with a Medicaid card; and children who have applied for Medicaid or [place] Health Choice, but were declined, whose parents can pay a reduced fee at time of service.  °[facility] ° [address], [place] (336) 641-4533 Patients are seen by appointment only. Walk-ins are not accepted. [facility] will see patients 18 years of age and older. °Monday - Tuesday (8am-5pm) °Most Wednesdays (8:30-5pm) °$30 per visit, cash only  °[facility] ° [address], [place] (336) 641-4533 Patients are seen by appointment only. Walk-ins are not accepted. [facility] will see patients 18 years of age and older. °One Wednesday Evening (Monthly: Volunteer Based).  $30 per visit, cash only  °[facility]  (919) 537-3737 for adults; Children under age 4, call Graduate Pediatric Dentistry at (919) [phone]. Children aged 4-14, please call (919) [phone] to request a pediatric application. ° Dental services are provided in all areas of dental care including fillings, crowns and bridges, complete and partial dentures, implants, gum treatment, root canals, and extractions. Preventive care is also provided. Treatment is provided to both adults and children. °Patients are selected via a lottery and there is often a waiting list. °  °[facility] [address], °[place] ° (336) 763-8833 [email or web address] °  °[facility] [address], [place], [place] (336)723-1848, Ext. 123 Second and Fourth Thursday of each month, opens at 6:30 AM; Clinic ends at 9 AM.  Patients are seen on a first-come first-served  basis, and a limited number are seen during each clinic.  ° °[facility] ° [address], [place],  (336) 723-7904   Eligibility Requirements °You must have lived in [place], [place], or [place] counties for at least the last three months. °  You cannot be eligible for state or federal sponsored healthcare insurance, including Veterans Administration, Medicaid, or Medicare. °  You generally cannot be eligible for healthcare insurance through your employer.  °  How to apply: °Eligibility screenings are held every Tuesday   and Wednesday afternoon from 1:00 pm until 4:00 pm. You do not need an appointment for the interview!  °[facility] [address], [place], [place] 336-631-2330   °[facility]  336-342-8273   °[facility]  336-703-3100   °[facility]  336-570-6415   ° °Behavioral Health Resources in the Community: °Intensive Outpatient Programs °Organization         Address  Phone  Notes  °[facility] [address], [place], [place] 336-878-6098   °[facility] [address], [place], [place] 336-832-9800   °[facility] [address], [place], [place] ° 336-882-2125   °[facility] [address],  °[place], [place] 1-800-853-5163 or 336-641-4981   °Substance Abuse Resources °Organization         Address  Phone  Notes  °[facility]  336-882-2125   °[facility]  336-784-9470   °[facility] [place] [facility]  336-285-9073   °[facility]  336-845-3988   °Residential & Outpatient Substance Abuse Program  1-800-659-3381   °Psychological Services °Organization         Address  Phone  Notes  °[facility]  336- 832-9600   °[facility]  336- 378-7881   °[facility] [address], [place] 1-800-853-5163 or 336-641-4981   ° °Mobile Crisis Teams °Organization          Address  Phone  Notes  °[facility], Mobile Crisis Care Unit  1-877-626-1772   °Assertive °[facility] ° [address] [place], [place] 336-834-9664   °Sharon DeEsch [address] °[place] 336-554-5454   ° °Self-Help/Support Groups °Organization         Address  Phone             Notes  °[facility] - variety of support groups  336- 373-1402 Call for more information  °Narcotics Anonymous (NA), [facility] [address], °[place]  2 meetings at this location  ° °Residential Treatment Programs °Organization         Address  Phone  Notes  °[facility] [address],    °[place]  1-866-801-8205   °[facility] ° [address], [place], [place] 704-293-8524   °[facility] [address], [place] 336-845-3988 Admissions: 8am-3pm M-F  °[facility] [address]    °[place], [place] 336-841-1104   °[facility] [address], [place], [place] 336-379-7146   °[facility] [place] [facility] [address]  °[place], [place] 336-285-9073   °[facility] Intensive Outpatient [address], [place], [place] 336-852-3033   °[facility] [address]  °[place], [place] 1-877-615-2722 or 336-784-9470   °[facility] [address] , [place] 336-227-7417 Accepts Medicaid  °[facility] [address]  °[place] 1-800-659-3381 Substance Abuse/Addiction Treatment  ° °[place] Behavioral Health Resources °Organization         Address  Phone  Notes  °[facility]  (888) 581-9988   °Julie Brannon, PhD [address] [place], [place]   (336) 349-5553 or (336) 951-0000   °[facility]   [address] °[place], [place] (336) 349-4454   °[facility] [address], [place], [place] (336) 342-8316 Insurance/Medicaid/sponsorship  through [facility]  °  [facility] [address]                                    [place], [place] (336) 342-8316 Therapy/tele-psych/case  °[facility] [address]  ° [place], [place] (336) 349-2233    °Dr. Arfeen  (336) 349-4544   °[facility] [place]  [employer] [place] [facility] 1) [address], [place] °2) [address], [place] °3)  [address], [place] (336) 349-3220 °(336) 342-7768 ° °(336) 342-8140   °[place] [facility] (336) 342-1394 or (336) 342-3537 (After Hours)    ° ° °

## 2014-04-24 ENCOUNTER — Encounter (HOSPITAL_COMMUNITY): Payer: Self-pay | Admitting: Emergency Medicine

## 2014-07-04 ENCOUNTER — Encounter (HOSPITAL_COMMUNITY): Payer: Self-pay | Admitting: Emergency Medicine

## 2014-07-04 DIAGNOSIS — R0602 Shortness of breath: Secondary | ICD-10-CM | POA: Diagnosis not present

## 2014-07-04 DIAGNOSIS — R079 Chest pain, unspecified: Secondary | ICD-10-CM | POA: Insufficient documentation

## 2014-07-04 DIAGNOSIS — Z720 Tobacco use: Secondary | ICD-10-CM | POA: Diagnosis not present

## 2014-07-04 DIAGNOSIS — R05 Cough: Secondary | ICD-10-CM | POA: Diagnosis not present

## 2014-07-04 DIAGNOSIS — J189 Pneumonia, unspecified organism: Secondary | ICD-10-CM | POA: Diagnosis not present

## 2014-07-04 DIAGNOSIS — Z8701 Personal history of pneumonia (recurrent): Secondary | ICD-10-CM | POA: Insufficient documentation

## 2014-07-04 DIAGNOSIS — Z792 Long term (current) use of antibiotics: Secondary | ICD-10-CM | POA: Diagnosis not present

## 2014-07-04 DIAGNOSIS — Z8659 Personal history of other mental and behavioral disorders: Secondary | ICD-10-CM | POA: Diagnosis not present

## 2014-07-04 DIAGNOSIS — J439 Emphysema, unspecified: Secondary | ICD-10-CM | POA: Diagnosis not present

## 2014-07-04 NOTE — ED Notes (Signed)
Pt. reports SOB with left chest pressure and productive cough onset 2 days ago , Cocaine abuse yesterday , denies emesis or diaphoresis .

## 2014-07-05 ENCOUNTER — Emergency Department (HOSPITAL_COMMUNITY): Payer: Medicare Other

## 2014-07-05 ENCOUNTER — Emergency Department (HOSPITAL_COMMUNITY)
Admission: EM | Admit: 2014-07-05 | Discharge: 2014-07-05 | Disposition: A | Discharge status: Home / Self Care | Payer: Medicare Other | Attending: Emergency Medicine | Admitting: Emergency Medicine

## 2014-07-05 DIAGNOSIS — J189 Pneumonia, unspecified organism: Secondary | ICD-10-CM | POA: Diagnosis not present

## 2014-07-05 DIAGNOSIS — R0602 Shortness of breath: Secondary | ICD-10-CM | POA: Diagnosis not present

## 2014-07-05 DIAGNOSIS — J439 Emphysema, unspecified: Secondary | ICD-10-CM | POA: Diagnosis not present

## 2014-07-05 HISTORY — DX: Cocaine abuse, uncomplicated: F14.10

## 2014-07-05 LAB — BASIC METABOLIC PANEL
Anion gap: 7 (ref 5–15)
BUN: 8 mg/dL (ref 6–23)
CO2: 24 mmol/L (ref 19–32)
Calcium: 9 mg/dL (ref 8.4–10.5)
Chloride: 107 mEq/L (ref 96–112)
Creatinine, Ser: 0.84 mg/dL (ref 0.50–1.10)
GFR, EST NON AFRICAN AMERICAN: 86 mL/min — AB (ref >90)
GLUCOSE: 93 mg/dL (ref 70–99)
POTASSIUM: 3.8 mmol/L (ref 3.5–5.1)
SODIUM: 138 mmol/L (ref 135–145)

## 2014-07-05 LAB — CBC
HEMATOCRIT: 41.7 % (ref 36.0–46.0)
Hemoglobin: 14.3 g/dL (ref 12.0–15.0)
MCH: 30 pg (ref 26.0–34.0)
MCHC: 34.3 g/dL (ref 30.0–36.0)
MCV: 87.4 fL (ref 78.0–100.0)
Platelets: 228 10*3/uL (ref 150–400)
RBC: 4.77 MIL/uL (ref 3.87–5.11)
RDW: 13.4 % (ref 11.5–15.5)
WBC: 8.4 10*3/uL (ref 4.0–10.5)

## 2014-07-05 LAB — I-STAT TROPONIN, ED: TROPONIN I, POC: 0 ng/mL (ref 0.00–0.08)

## 2014-07-05 LAB — BRAIN NATRIURETIC PEPTIDE: B Natriuretic Peptide: 21.9 pg/mL (ref 0.0–100.0)

## 2014-07-05 MED ORDER — LEVOFLOXACIN 750 MG PO TABS
ORAL_TABLET | ORAL | Status: AC
  Filled 2014-07-05: qty 1

## 2014-07-05 NOTE — ED Notes (Signed)
See Downtime Charting. 

## 2014-07-06 NOTE — ED Provider Notes (Signed)
CSN: [id number]     Arrival date & time 07/04/14  2316 History   None    Chief Complaint  Patient presents with  . Shortness of Breath     (Consider location/radiation/quality/duration/timing/severity/associated sxs/prior Treatment) HPI With a PMhx of hospitalization for necrotizing pneumonia diagnoses presents to the ED complaining of gradual onset persistent chest congestion, which started yesterday. Pt states that her current symptoms feel like the early stages of her past pneumonia diagnoses. She admits to a productive cough with unspecified colored sputum. Pt also endorses some mild chest pain secondary to the forceful cough. She has mild shortness of breath but denies lower extremity swelling or pain. Denies any fever, chills, nausea, emesis, sore throat, or rhinorrhea.  Past Medical History  Diagnosis Date  . Depression   . Substance abuse     crack cocaine  . Seizures   . Cocaine abuse    Past Surgical History  Procedure Laterality Date  . Right lung collapse     No family history on file. History  Substance Use Topics  . Smoking status: Current Every Day Smoker -- 1.00 packs/day for 19 years    Types: Cigarettes  . Smokeless tobacco: Never Used  . Alcohol Use: No   OB History    Gravida Para Term Preterm AB TAB SAB Ectopic Multiple Living   Review of Systems  Constitutional: Negative for fever and chills.  HENT: Negative for congestion, rhinorrhea and sore throat.   Respiratory: Positive for cough and shortness of breath.   Cardiovascular: Positive for chest pain. Negative for palpitations and leg swelling.  Gastrointestinal: Negative for nausea, vomiting, abdominal pain, diarrhea and constipation.  Musculoskeletal: Negative for myalgias, back pain, neck pain and neck stiffness.  Skin: Negative for rash and wound.  Neurological: Negative for dizziness, weakness, light-headedness, numbness and headaches.  All other systems reviewed and are  negative.     Allergies  Darvocet and Percocet  Home Medications   Prior to Admission medications   Medication Sig Start Date End Date Taking? Authorizing Provider  amoxicillin-clavulanate (AUGMENTIN) 875-125 MG per tablet Take 1 tablet by mouth 2 (two) times daily. For 4-6weeks 02/04/14   Zannie Cove, MD  doxycycline (VIBRA-TABS) 100 MG tablet Take 1 tablet (100 mg total) by mouth 2 (two) times daily. For 4-6weeks 02/04/14   Zannie Cove, MD  metroNIDAZOLE (FLAGYL) 500 MG tablet Take 1 tablet (500 mg total) by mouth 3 (three) times daily. 03/17/14   Nelia Shi, MD   BP 102/70 mmHg  Pulse 89  Temp(Src) 98.1 F (36.7 C) (Oral)  Resp 18  Ht  (1.626 m)  Wt 100 lb (45.36 kg)  BMI 17.16 kg/m2  SpO2 95%  LMP 06/06/2014 Physical Exam  Constitutional: She is oriented to person, place, and time. She appears well-developed and well-nourished. No distress.  No distress. Coloring in room  HENT:  Head: Normocephalic and atraumatic.  Mouth/Throat: Oropharynx is clear and moist. No oropharyngeal exudate.  Eyes: EOM are normal. Pupils are equal, round, and reactive to light.  Neck: Normal range of motion. Neck supple.  Cardiovascular: Normal rate and regular rhythm.   Pulmonary/Chest: Effort normal and breath sounds normal. No respiratory distress. She has no wheezes. She has no rales. She exhibits no tenderness.  Abdominal: Soft. Bowel sounds are normal. She exhibits no distension and no mass. There is no tenderness. There is no rebound and no guarding.  Musculoskeletal: Normal range of motion. She exhibits no edema or tenderness.  Neurological: She is alert and oriented to person, place, and time.  5/5 motor in all extremities. Sensation is intact.  Skin: Skin is warm and dry. No rash noted. No erythema.  Psychiatric: She has a normal mood and affect. Her behavior is normal.  Nursing note and vitals reviewed.   ED Course  Procedures (including critical care time) Labs  Review Labs Reviewed  BASIC METABOLIC PANEL - Abnormal; Notable for the following:    GFR calc non Af Amer 86 (*)    All other components within normal limits  CBC  BRAIN NATRIURETIC PEPTIDE  I-STAT TROPOININ, ED    Imaging Review Dg Chest 2 View  07/05/2014   CLINICAL DATA:  Cough, shortness of breath. History of recent pneumonia. Initially counter.  EXAM: CHEST  2 VIEW  COMPARISON:  Prior radiograph from 03/17/2014  FINDINGS: The cardiac and mediastinal silhouettes are within normal limits.  Lungs are hyperinflated with attenuation of the pulmonary markings, compatible with COPD. There is parenchymal scarring with scattered patchy opacity within the left upper lobe. Finding concerning for persistent or recurrent pneumonia. No other focal infiltrates identified. No pulmonary edema or pleural effusion. No pneumothorax.  No acute osseus abnormality.  IMPRESSION: 1. Patchy left upper lobe opacity, slightly improved relative to prior radiograph from 03/17/2014. While this finding may reflect sequelae of recent infection, possible recurrent or new pneumonia could also be considered. 2. Emphysema.   Electronically Signed   By: Rise Mu M.D.   On: 07/05/2014 00:26     EKG Interpretation   Date/Time:  Tuesday July 04 2014 23:36:25 EST Ventricular Rate:  87 PR Interval:  112 QRS Duration: 72 QT Interval:  370 QTC Calculation: 445 R Axis:   85 Text Interpretation:  Normal sinus rhythm Normal ECG ED PHYSICIAN  INTERPRETATION AVAILABLE IN [facility] Confirmed by TEST, Record  ([id number]) on 07/06/2014 8:04:53 AM      MDM   Final diagnoses:  SOB (shortness of breath)    Questionable early or resolving left upper lobe pneumonia. Given new onset of symptoms start antibiotics. Vital signs remained stable. Cardiac workup initiated by the nursing staff due to recent cocaine use and chest pain associated with cough. EKG without any concerning findings. Initial troponin was normal.  Chest pain is very atypical for coronary artery disease. Do not believe further workup is necessary at this point. Patient's been given return precautions and voiced understanding.    Loren Racer, MD 07/06/14 1049

## 2014-07-12 ENCOUNTER — Encounter (HOSPITAL_COMMUNITY): Payer: Self-pay | Admitting: *Deleted

## 2014-07-12 ENCOUNTER — Emergency Department (HOSPITAL_COMMUNITY)
Admission: EM | Admit: 2014-07-12 | Discharge: 2014-07-12 | Disposition: A | Discharge status: Home / Self Care | Payer: Medicare Other | Attending: Emergency Medicine | Admitting: Emergency Medicine

## 2014-07-12 DIAGNOSIS — Z792 Long term (current) use of antibiotics: Secondary | ICD-10-CM | POA: Insufficient documentation

## 2014-07-12 DIAGNOSIS — Z8659 Personal history of other mental and behavioral disorders: Secondary | ICD-10-CM | POA: Insufficient documentation

## 2014-07-12 DIAGNOSIS — R000 Tachycardia, unspecified: Secondary | ICD-10-CM | POA: Diagnosis not present

## 2014-07-12 DIAGNOSIS — Z09 Encounter for follow-up examination after completed treatment for conditions other than malignant neoplasm: Secondary | ICD-10-CM

## 2014-07-12 DIAGNOSIS — Z8669 Personal history of other diseases of the nervous system and sense organs: Secondary | ICD-10-CM | POA: Diagnosis not present

## 2014-07-12 DIAGNOSIS — Z008 Encounter for other general examination: Secondary | ICD-10-CM | POA: Diagnosis not present

## 2014-07-12 DIAGNOSIS — Z720 Tobacco use: Secondary | ICD-10-CM | POA: Insufficient documentation

## 2014-07-12 NOTE — ED Provider Notes (Signed)
CSN: [id number]     Arrival date & time 07/12/14  1115 History  This chart was scribed for non-physician practitioner working with Lyanne Co, MD by Richarda Overlie, ED Scribe. This patient was seen in room TR06C/TR06C and the patient's care was started at 11:33 AM.      Chief Complaint  Patient presents with  . Follow-up   The history is provided by the patient. No language interpreter was used.   HPI Comments: Deanna Klein is a 39 y.o. female with a PMhx of hospitalization for necrotizing pneumonia who presents to the Emergency Department for a recheck after being treated for pneumonia on 07/04/14. Pt states she has been taking all her antibiotics and states she is feeling much better. She says she does not have a PCP. Pt reports no modifying or alleviating factors at this time. She denies any pain, fever, chills and cough.   Past Medical History  Diagnosis Date  . Depression   . Substance abuse     crack cocaine  . Seizures   . Cocaine abuse    Past Surgical History  Procedure Laterality Date  . Right lung collapse     History reviewed. No pertinent family history. History  Substance Use Topics  . Smoking status: Current Every Day Smoker -- 1.00 packs/day for 19 years    Types: Cigarettes  . Smokeless tobacco: Never Used  . Alcohol Use: No   OB History    Gravida Para Term Preterm AB TAB SAB Ectopic Multiple Living   Review of Systems  All other systems reviewed and are negative.     Allergies  Darvocet and Percocet  Home Medications   Prior to Admission medications   Medication Sig Start Date End Date Taking? Authorizing Provider  amoxicillin-clavulanate (AUGMENTIN) 875-125 MG per tablet Take 1 tablet by mouth 2 (two) times daily. For 4-6weeks 02/04/14   Zannie Cove, MD  doxycycline (VIBRA-TABS) 100 MG tablet Take 1 tablet (100 mg total) by mouth 2 (two) times daily. For 4-6weeks 02/04/14   Zannie Cove, MD  metroNIDAZOLE (FLAGYL)  500 MG tablet Take 1 tablet (500 mg total) by mouth 3 (three) times daily. 03/17/14   Nelia Shi, MD   BP 116/79 mmHg  Pulse 113  Temp(Src) 97.9 F (36.6 C) (Oral)  Resp 22  SpO2 96%  LMP 06/06/2014 Physical Exam  Constitutional: She is oriented to person, place, and time. She appears well-developed and well-nourished.  HENT:  Head: Normocephalic and atraumatic.  Eyes: Conjunctivae are normal. Right eye exhibits no discharge. Left eye exhibits no discharge.  Neck: Neck supple. No tracheal deviation present.  Cardiovascular: Regular rhythm.  Tachycardia present.   Pulmonary/Chest: Effort normal and breath sounds normal. No tachypnea. No respiratory distress. She has no decreased breath sounds. She has no wheezes. She has no rhonchi. She has no rales. She exhibits no tenderness.  Lungs clear bilaterally.   Abdominal: She exhibits no distension.  Neurological: She is alert and oriented to person, place, and time.  Skin: Skin is warm and dry.  Psychiatric: She has a normal mood and affect. Her behavior is normal.  Nursing note and vitals reviewed.   ED Course  Procedures   DIAGNOSTIC STUDIES: Oxygen Saturation is 96% on RA, normal by my interpretation.    COORDINATION OF CARE: 11:36 AM Discussed treatment plan with pt at bedside and pt agreed to plan.   Labs  Review Labs Reviewed - No data to display  Imaging Review No results found.   EKG Interpretation None      MDM   Final diagnoses:  Examination for, follow-up   39 yo returning for re-check after pneumonia diagnosis.  She reports completing the entire course of her antibiotic and is feeling very well with no complaints.  She states her cough has resolved and denies any fevers, chills, chest pain or shortness of breath.  Discussed her heart rate, which was elevated to 114.  She reports this is not uncommon for her especially after smoking.  Due to pt's well appearance, will discharge today with resources to  establish care with a PCP for further follow-up as needed.  Pt is well-appearing and in no acute distress. She appears safe to be discharged.  Return precautions provided.   I personally performed the services described in this documentation, which was scribed in my presence. The recorded information has been reviewed and is accurate.  Filed Vitals:   07/12/14 1120 07/12/14 1151  BP: 116/79 106/75  Pulse: 113 114  Temp: 97.9 F (36.6 C)   TempSrc: Oral   Resp: 22   SpO2: 96% 96%   Meds given in ED:  Medications - No data to display  Discharge Medication List as of 07/12/2014 11:48 AM           Harle Battiest, NP 07/13/14 1610  Lyanne Co, MD 07/13/14 [phone]

## 2014-07-12 NOTE — Discharge Instructions (Signed)
Follow directions provided. Use the resource guide to establish care with a primary care doctor for any further concerns. Her lung sounds today are clear and it seems that your pneumonia has improved and is resolving. Don't hesitate to return for any new, worsening, or concerning symptoms.   SEEK IMMEDIATE MEDICAL CARE IF:  Your illness becomes worse. This is especially true if you are elderly or weakened from any other disease.  You cannot control your cough with suppressants and are losing sleep.  You begin coughing up blood.  You develop pain which is getting worse or is uncontrolled with medicines.  Any of the symptoms which initially brought you in for treatment are getting worse rather than better.  You develop shortness of breath or chest pain.   Emergency Department Resource Guide 1) Find a Doctor and Pay Out of Pocket Although you won't have to find out who is covered by your insurance plan, it is a good idea to ask around and get recommendations. You will then need to call the office and see if the doctor you have chosen will accept you as a new patient and what types of options they offer for patients who are self-pay. Some doctors offer discounts or will set up payment plans for their patients who do not have insurance, but you will need to ask so you aren't surprised when you get to your appointment.  2) Contact Your Local Health Department Not all health departments have doctors that can see patients for sick visits, but many do, so it is worth a call to see if yours does. If you don't know where your local health department is, you can check in your phone book. The CDC also has a tool to help you locate your state's health department, and many state websites also have listings of all of their local health departments.  3) Find a Walk-in Clinic If your illness is not likely to be very severe or complicated, you may want to try a walk in clinic. These are popping up all over the  country in pharmacies, drugstores, and shopping centers. They're usually staffed by nurse practitioners or physician assistants that have been trained to treat common illnesses and complaints. They're usually fairly quick and inexpensive. However, if you have serious medical issues or chronic medical problems, these are probably not your best option.  No Primary Care Doctor: - Call Health Connect at  [phone] - they can help you locate a primary care doctor that  accepts your insurance, provides certain services, etc. - Physician Referral Service- [phone]  Chronic Pain Problems: Organization         Address  Phone   Notes  [facility]  [phone] Patients need to be referred by their primary care doctor.   Medication Assistance: Organization         Address  Phone   Notes  [facility] [address] [place], [place] [address] [phone] --Must be a resident of [facility] -- Must have NO insurance coverage whatsoever (no Medicaid/ Medicare, etc.) -- The pt. MUST have a primary care doctor that directs their care regularly and follows them in the community   MedAssist  [phone]   [employer]  [phone]    Agencies that provide inexpensive medical care: Organization         Address  Phone   Notes  [facility]  [phone]   Patrcia Dolly  [facility] Internal Medicine    [phone]   [facility] [address] [place], [place] [address] [phone]   [facility] [address], [place] [phone]   Planned Parenthood    [phone]   [facility]    [phone]   [facility]  [address], [place] Phone:  [phone], Fax:  [phone] Hours of Operation:  9 am - 6 pm, M-F.  Also accepts Medicaid/Medicare and self-pay.  [facility]  [address], [place] Phone: [phone], Fax: [phone]. Hours of Operation:  8:30 am - 5:30 pm, M-F.  Also accepts Medicaid and self-pay.  [facility] [place] [address], [place] Phone: [phone]   [facility] [address] Natasha Bence [employer], [place] [phone], Ext. [phone] Mondays & Thursdays: 7-9 AM.  First 15 patients are seen on a first come, first serve basis.    Medicaid-accepting [facility] Providers:  Organization         Address  Phone   Notes  [facility] [address],  [phone] Also accepts self-pay patients.  [facility] [address] Laurell Josephs [place], [place]  [phone]   [facility] [address], [place] [phone]   [facility] [address], [place] [phone]   Renaye Rakers [address], [place]   [phone] Only accepts [place] Access [place] patients after they have their name applied to their card.   Self-Pay (no insurance) in [facility]:  Organization         Address  Phone   Notes  Sickle Cell Patients, [facility] Internal Medicine [address] [place], [place] [phone]   [facility] [address] [place], [place] [phone]   [facility] [place]  [address], [place] [phone]   [facility]/Dr. Osei-Bonsu  [address], [place] or [address], [place] [phone] Phone number for both [place] and [place] locations is the same.  [facility] and [facility] [address] [place] [phone]   [facility] [address] [place] or [address] [phone]   [facility] [address], [place] [phone], phone; [phone], fax Sees patients 1st and 3rd Saturday of every month.  Must  not qualify for public or private insurance (i.e. Medicaid, Medicare, [place] Health Choice, Veterans' Benefits)  Household income should be no more than 200% of the poverty level The clinic cannot treat you if you are pregnant or think you are pregnant  Sexually transmitted diseases are not treated at the clinic.    Dental Care: Organization         Address  Phone  Notes  [facility] Department of [facility] [address] [place], [place] [phone] Accepts children up to age 64 who are enrolled in [place] or [place] Health Choice; pregnant women with a Medicaid card; and children who have applied for Medicaid or [place] Health Choice, but were declined, whose parents can pay a reduced fee at time of service.  [facility] of [facility]  [address], [employer]  [place] [phone] Accepts children up to age 9 who are enrolled in Medicaid or [place] Health Choice; pregnant women with a Medicaid card; and children who have applied for Medicaid or [place] Health Choice, but were declined, whose parents can pay a reduced fee at time of service.  [facility]  [address] [place], [place] [phone] Patients are seen by appointment only. Walk-ins are not accepted. [facility] will see patients 42 years of age and older. Monday - Tuesday (8am-5pm) Most Wednesdays (8:30-5pm) $30 per visit, cash only  [facility]  [address], [facility] [phone] Patients are seen by appointment only. Walk-ins are not accepted. [facility] will see patients 45 years of age and older. One Wednesday Evening (Monthly: Volunteer Based).  $30 per visit, cash only  [employer]  [phone] for adults; Children under age 35, call Graduate Pediatric Dentistry at [phone]. Children aged 36-14, please call [phone] to request a pediatric application.  Dental services are  provided in all areas of dental care including fillings, crowns and bridges, complete and partial dentures, implants, gum treatment, root canals, and extractions. Preventive care is also provided. Treatment is provided to both adults and children. Patients are selected via a lottery and there is often a waiting list.   [facility] [address] [place]  [phone] [email or web address]   [facility] [address] [place], [place] [phone], Ext. [phone] Second and Fourth Thursday of each month, opens at 6:30 AM; Clinic ends at 9 AM.  Patients are seen on a first-come first-served basis, and a limited number are seen during each clinic.   [facility]  [address] [place], [place] [phone]   Eligibility Requirements You must have lived in [place], [place], or [place] counties for at least the last three months.   You cannot be eligible for state or federal sponsored [place], including [employer], [place], or [employer].   You generally cannot be eligible for healthcare insurance through your employer.    How to apply: Eligibility screenings are held every Tuesday and Wednesday afternoon from 1:00 pm until 4:00 pm. You do not need an appointment for the interview!  [facility] [address] [place], [place] [phone]   [facility] Health Department  [phone]   [facility] Health Department  [phone]   [facility] Health Department  [phone]    Behavioral Health Resources in the Community: Intensive Outpatient Programs Organization         Address  Phone  Notes  [facility] Services [address], [place], [place] [phone]   [facility] Outpatient [address] [place], [place] [phone]   [facility] [address] [place], [place]  [phone]   [facility] [address]  [place], [place]  [phone] or [phone]   Substance Abuse Resources Organization         Address  Phone  Notes  [facility] Services  [phone]   [facility]  [phone]   [facility]  [phone]   Floydene Flock  [phone]   Residential & Outpatient Substance Abuse Program  [phone]   Psychological Services Organization         Address  Phone  Notes  [facility]  [phone]   [facility]  [phone]  [place] [address] [place] or [phone]    Mobile Crisis Teams Organization         Address  Phone  Notes  [facility], Mobile Crisis Care Unit  [phone]   [facility]  [address] [place], [place]   [place] Levels [address], [place] [phone]    Self-Help/Support Groups Organization         Address  Phone             Notes  [place]. of [place] - variety of support groups  [place] Call for more information  Narcotics Anonymous (NA), [facility] [address], [employer] [place]  2 meetings at this location   Special educational needs teacher         Address  Phone  Notes  [facility] [place],    [place]  [phone]   [facility]  [address] [place] [phone], [place], [place]   Monona Derby, [place] [phone] Admissions: 8am-3pm M-F  Incentives Substance [place] [address],    [place], [place] [phone]   [facility] [address] [place], [place], [place]   [facility] [address],  [place], [place]   [facility] - Intensive Outpatient [address], [place], [place]   [facility] ([place]  [place], [place] [phone] or  [phone]   [facility] ([facility]) [address], [place], [place] Accepts Medicaid  [facility] [place] [address],  [place] 1-9140997817 Substance Abuse/Addiction Treatment   [facility] Organization         Address  Phone  Notes  [facility]  [phone]   Domenic Schwab, PhD [address] Arlis Port[place], [place]   [phone] or [phone]   [place], [place] [phone]   [facility] [address] [place], [place] [phone] Insurance/Medicaid/sponsorship through [facility] and Families [address], [address] [place]                                    [place], [place] [phone] [place] [address], [place] [phone]    Dr. Adele Schilder  [phone]   [facility] Dept. 1) [address], [place] 2) [place] 3)  [address], [place] [phone]  [phone]   [place] [phone] or [phone] (After Hours)

## 2014-07-12 NOTE — ED Notes (Signed)
Pt reports needing a recheck after being treated for pneumonia. Pt has taken all her antibiotics and reports feeling better.

## 2014-08-16 DIAGNOSIS — F319 Bipolar disorder, unspecified: Secondary | ICD-10-CM | POA: Diagnosis not present

## 2015-06-11 ENCOUNTER — Emergency Department (HOSPITAL_COMMUNITY)
Admission: EM | Admit: 2015-06-11 | Discharge: 2015-06-11 | Disposition: A | Discharge status: Home / Self Care | Payer: Medicare Other | Attending: Emergency Medicine | Admitting: Emergency Medicine

## 2015-06-11 ENCOUNTER — Encounter (HOSPITAL_COMMUNITY): Payer: Self-pay | Admitting: *Deleted

## 2015-06-11 ENCOUNTER — Emergency Department (HOSPITAL_COMMUNITY): Payer: Medicare Other

## 2015-06-11 DIAGNOSIS — Z983 Post therapeutic collapse of lung status: Secondary | ICD-10-CM | POA: Insufficient documentation

## 2015-06-11 DIAGNOSIS — F17210 Nicotine dependence, cigarettes, uncomplicated: Secondary | ICD-10-CM | POA: Insufficient documentation

## 2015-06-11 DIAGNOSIS — R05 Cough: Secondary | ICD-10-CM | POA: Insufficient documentation

## 2015-06-11 DIAGNOSIS — R079 Chest pain, unspecified: Secondary | ICD-10-CM | POA: Diagnosis not present

## 2015-06-11 DIAGNOSIS — R0602 Shortness of breath: Secondary | ICD-10-CM | POA: Diagnosis not present

## 2015-06-11 DIAGNOSIS — Z8659 Personal history of other mental and behavioral disorders: Secondary | ICD-10-CM | POA: Insufficient documentation

## 2015-06-11 DIAGNOSIS — Z792 Long term (current) use of antibiotics: Secondary | ICD-10-CM | POA: Insufficient documentation

## 2015-06-11 DIAGNOSIS — J439 Emphysema, unspecified: Secondary | ICD-10-CM | POA: Diagnosis not present

## 2015-06-11 LAB — BASIC METABOLIC PANEL
ANION GAP: 8 (ref 5–15)
BUN: 14 mg/dL (ref 6–20)
CO2: 19 mmol/L — ABNORMAL LOW (ref 22–32)
Calcium: 9.4 mg/dL (ref 8.9–10.3)
Chloride: 108 mmol/L (ref 101–111)
Creatinine, Ser: 0.84 mg/dL (ref 0.44–1.00)
Glucose, Bld: 96 mg/dL (ref 65–99)
POTASSIUM: 3.9 mmol/L (ref 3.5–5.1)
SODIUM: 135 mmol/L (ref 135–145)

## 2015-06-11 LAB — I-STAT TROPONIN, ED: Troponin i, poc: 0 ng/mL (ref 0.00–0.08)

## 2015-06-11 LAB — CBC WITH DIFFERENTIAL/PLATELET
Basophils Absolute: 0 K/uL (ref 0.0–0.1)
Basophils Relative: 1 %
Eosinophils Absolute: 0.1 K/uL (ref 0.0–0.7)
Eosinophils Relative: 1 %
HCT: 43.2 % (ref 36.0–46.0)
Hemoglobin: 14.9 g/dL (ref 12.0–15.0)
Lymphocytes Relative: 35 %
Lymphs Abs: 3.1 K/uL (ref 0.7–4.0)
MCH: 30.5 pg (ref 26.0–34.0)
MCHC: 34.5 g/dL (ref 30.0–36.0)
MCV: 88.5 fL (ref 78.0–100.0)
Monocytes Absolute: 0.5 K/uL (ref 0.1–1.0)
Monocytes Relative: 6 %
Neutro Abs: 5.1 K/uL (ref 1.7–7.7)
Neutrophils Relative %: 57 %
Platelets: 268 K/uL (ref 150–400)
RBC: 4.88 MIL/uL (ref 3.87–5.11)
RDW: 12.4 % (ref 11.5–15.5)
WBC: 8.8 K/uL (ref 4.0–10.5)

## 2015-06-11 NOTE — ED Notes (Signed)
Went to rehook the patient up to the monitor after she returned to the restroom and she refused stating the doctor said she was free to go and is just waiting on her papers. Nurse notified.

## 2015-06-11 NOTE — ED Provider Notes (Signed)
CSN: [id number]     Arrival date & time 06/11/15  0023 History  By signing my name below, I, Gonzella Lex, attest that this documentation has been prepared under the direction and in the presence of Marily Memos, MD. Electronically Signed: Gonzella Lex, Scribe. 06/11/2015. 1:23 AM.    Chief Complaint  Patient presents with  . Chest Pain    The history is provided by the patient. No language interpreter was used.    HPI Comments: Deanna Klein is a 39 y.o. female who presents to the Emergency Department complaining of hearing "bubbling" liquid noises coming from her lung/chest area this evening for a few seconds when she sat up. Pt reports there has been no change in her coughing but notes that she sometimes experiences left-sided chest pain intermittently. Pt had lung surgery about a year and a half ago on her left side and she also notes that she had PNA last year which has since resolved. Pt currently smokes. Pt denies abdominal pain.  PCP: none  Past Medical History  Diagnosis Date  . Depression   . Substance abuse     crack cocaine  . Seizures (HCC)   . Cocaine abuse    Past Surgical History  Procedure Laterality Date  . Right lung collapse     No family history on file. Social History  Substance Use Topics  . Smoking status: Current Every Day Smoker -- 1.00 packs/day for 19 years    Types: Cigarettes  . Smokeless tobacco: Never Used  . Alcohol Use: No   OB History    Gravida Para Term Preterm AB TAB SAB Ectopic Multiple Living   Review of Systems  Respiratory: Positive for cough.   Cardiovascular: Positive for chest pain.       "bubbling" noises in chest/lungs  Gastrointestinal: Negative for abdominal pain.  All other systems reviewed and are negative.  Allergies  Darvocet and Percocet  Home Medications   Prior to Admission medications   Medication Sig Start Date End Date Taking? Authorizing Provider   amoxicillin-clavulanate (AUGMENTIN) 875-125 MG per tablet Take 1 tablet by mouth 2 (two) times daily. For 4-6weeks 02/04/14   Zannie Cove, MD  doxycycline (VIBRA-TABS) 100 MG tablet Take 1 tablet (100 mg total) by mouth 2 (two) times daily. For 4-6weeks 02/04/14   Zannie Cove, MD  metroNIDAZOLE (FLAGYL) 500 MG tablet Take 1 tablet (500 mg total) by mouth 3 (three) times daily. 03/17/14   Nelva Nay, MD   BP 95/77 mmHg  Pulse 83  Temp(Src) 97.6 F (36.4 C)  Resp 13  Ht 5' 4.5" (1.638 m)  Wt 105 lb (47.628 kg)  BMI 17.75 kg/m2  SpO2 97%  LMP 05/29/2015 Physical Exam  Constitutional: She is oriented to person, place, and time. She appears well-developed and well-nourished. No distress.  HENT:  Head: Normocephalic.  Eyes: Conjunctivae are normal.  Cardiovascular: Normal rate, regular rhythm and normal heart sounds.   Pulmonary/Chest: Effort normal and breath sounds normal.  Abdominal: She exhibits no distension.  Neurological: She is alert and oriented to person, place, and time.  Skin: Skin is warm and dry.  Psychiatric: She has a normal mood and affect.  Nursing note and vitals reviewed.   ED Course  Procedures    Counseled patient for approximately 5 minutes regarding smoking cessation. Discussed risks of smoking and how they applied and affected their visit here today.  Patient not ready to quit at this time, however will follow up with their primary doctor when they are.   CPT code: 16109: intermediate counseling for smoking cessation   DIAGNOSTIC STUDIES:    Oxygen Saturation is 96% on RA, adequate by my interpretation.   COORDINATION OF CARE:  1:20 AM Will review chest xray. Advise pt to take ibuprofen and neproxin at home for pain relief. Discussed treatment plan with pt at bedside and pt agreed to plan.   Labs Review Labs Reviewed  BASIC METABOLIC PANEL - Abnormal; Notable for the following:    CO2 19 (*)    All other components within normal limits  CBC  WITH DIFFERENTIAL/PLATELET  Rosezena Sensor, ED    Imaging Review Dg Chest 2 View  06/11/2015  CLINICAL DATA:  Left-sided chest pain for months. Bubble sound on the left tonight, prompting presentation. EXAM: CHEST  2 VIEW COMPARISON:  07/05/2014 FINDINGS: Continued clearing of left upper lobe opacity. There is severe bullous emphysema throughout the hyperinflated lungs. There is no edema, acute consolidation, effusion, or pneumothorax. Normal heart size and mediastinal contours. IMPRESSION: 1. No acute finding. 2. Severe emphysema. Electronically Signed   By: Marnee Spring M.D.   On: 06/11/2015 01:21   I have personally reviewed and evaluated these images and lab results as part of my medical decision-making.   EKG Interpretation   Date/Time:  Monday June 11 2015 00:24:02 EST Ventricular Rate:  104 PR Interval:  116 QRS Duration: 68 QT Interval:  354 QTC Calculation: 465 R Axis:   89 Text Interpretation:  Sinus tachycardia Right atrial enlargement  Borderline ECG Confirmed by [facility] MD, Barbara Cower [phone]) on 06/11/2015  12:57:55 AM      MDM   Final diagnoses:  Chest pain, unspecified chest pain type    Of 1 second of a bubbling sound in her left chest. Has intermittent chest pain every month or 2 however this was not associated with that. She was also not associated with any significant shortness of breath or other symptoms. No recent trauma. Does have a history of having pneumonia and pneumothorax. No fevers, cough or other symptoms. Chest x-ray EKG okay. Lung sounds were within normal limits. Not sure of the cause of this isolated sound suspect it may not have been in her chest at all, however I discussed with her reasons to return to the emergency department. I also discussed with her implications of emphysema and counseled her to quit smoking as documented above. She is not ready at this time however will talk to her primary doctor when she becomes ready.  I personally  performed the services described in this documentation, which was scribed in my presence. The recorded information has been reviewed and is accurate.     Marily Memos, MD 06/11/15 [phone]

## 2015-06-11 NOTE — Discharge Instructions (Signed)
°Emergency Department Resource Guide °1) Find a Doctor and Pay Out of Pocket °Although you won't have to find out who is covered by your insurance plan, it is a good idea to ask around and get recommendations. You will then need to call the office and see if the doctor you have chosen will accept you as a new patient and what types of options they offer for patients who are self-pay. Some doctors offer discounts or will set up payment plans for their patients who do not have insurance, but you will need to ask so you aren't surprised when you get to your appointment. ° °2) Contact Your Local Health Department °Not all health departments have doctors that can see patients for sick visits, but many do, so it is worth a call to see if yours does. If you don't know where your local health department is, you can check in your phone book. The CDC also has a tool to help you locate your state's health department, and many state websites also have listings of all of their local health departments. ° °3) Find a Walk-in Clinic °If your illness is not likely to be very severe or complicated, you may want to try a walk in clinic. These are popping up all over the country in pharmacies, drugstores, and shopping centers. They're usually staffed by nurse practitioners or physician assistants that have been trained to treat common illnesses and complaints. They're usually fairly quick and inexpensive. However, if you have serious medical issues or chronic medical problems, these are probably not your best option. ° °No Primary Care Doctor: °- Call [facility] at  832-8000 - they can help you locate a primary care doctor that  accepts your insurance, provides certain services, etc. °- Physician Referral Service- 1-800-533-3463 ° °Chronic Pain Problems: °Organization         Address  Phone   Notes  °[facility]  (336) 297-2271 Patients need to be referred by their primary care doctor.  ° °Medication  Assistance: °Organization         Address  Phone   Notes  °[place] [facility] [address] °[place], [place] [address] (336) 641-8030 --Must be a resident of [place] °-- Must have NO insurance coverage whatsoever (no Medicaid/ Medicare, etc.) °-- The pt. MUST have a primary care doctor that directs their care regularly and follows them in the community °  °[facility]  (866) 331-1348   °[employer]  (888) 892-1162   ° °Agencies that provide inexpensive medical care: °Organization         Address  Phone   Notes  °[facility]  (336) 832-8035   ° Internal Medicine    (336) 832-7272   °[facility] [address] °[place], [place] [address] (336) 832-4777   °[facility] [address], °[place] (336) 271-4999   °[facility]    (336) 373-0678   °[facility]    (336) 272-1050   °[facility] ° [address], [place] Phone:  (336) 832-4444, Fax:  (336) 832-4440 Hours of Operation:  9 am - 6 pm, M-F.  Also accepts Medicaid/Medicare and self-pay.  °[facility] ° [address],  Phone: (336) 832-3150, Fax: (336) 832-3151. Hours of Operation:  8:30 am - 5:30 pm, M-F.  Also accepts Medicaid and self-pay.  °[facility] [place] [address], [place] Phone: (336) 878-6027   °[facility] [address], [place], [place] (336)723-1848, Ext. 123 Mondays & Thursdays: 7-9 AM.  First 15 patients are seen on a first come, first serve basis. °  ° °Medicaid-accepting [place] Providers: ° °Organization         Address  Phone   Notes  °[facility] [address], [place] (336) 641-2100 Also accepts self-pay patients.  °[facility] [address], [place] ° (336) 856-9996   °[facility] [address], [place]  (336) 288-8857   °[facility] [address] [place] [address], [place] (336) 299-7000   °Veita Bland [address], [place]  ° (336) 373-1557 Only accepts [place] Access Medicaid patients after they have their name applied to their card.  ° °Self-Pay (no insurance) in [place]: ° °Organization         Address  Phone   Notes  °Sickle Cell Patients, [facility] [address], [place] (336) 832-1970   °[facility] [address], [place] (336) 832-4400   °[facility] ° [address], [place] (336) 992-4800   °[facility]/Dr. Osei-Bonsu ° [address] [place] [address], [place] or [address], [place] (336) 841-8500 Phone number for both [place] and [place] locations is the same.  °[facility] [address], [place] (336) 299-0000   °[facility] [address] [place] [address], [place] or [address] (336) 852-7530 °(336) 878-2260   °[facility] [address], [place] (336) 350-1642, phone; (336) 294-5005, fax Sees patients 1st and 3rd Saturday of every month.  Must not qualify for public or private insurance (i.e. Medicaid, Medicare, [place] Health Choice, Veterans' Benefits) • Household income should be no more than 200% of the poverty level •The clinic cannot treat you if you are pregnant or think you are pregnant • Sexually transmitted diseases are not treated at the clinic.  ° ° °Dental Care: °Organization         Address  Phone  Notes  °[place] [facility] [address], [place] (336) 641-6152 Accepts children up to age 21 who are enrolled in Medicaid or [place] Health Choice; pregnant women with a Medicaid card; and children who have applied for Medicaid or [place] Health Choice, but were declined, whose parents can pay a reduced fee at time of service.  °[place]  [facility] [place]  [address], [place] (336) 641-7733 Accepts children up to age 21 who are enrolled in Medicaid or [place] Health Choice; pregnant women with a Medicaid card; and children who have applied for Medicaid or [place] Health Choice, but were declined, whose parents can pay a reduced fee at time of service.  °[facility] ° [address], [place] (336) 641-4533 Patients are seen by appointment only. Walk-ins are not accepted. [facility] will see patients 18 years of age and older. °Monday - Tuesday (8am-5pm) °Most Wednesdays (8:30-5pm) °$30 per visit, cash only  °[facility] ° [address], [place] (336) 641-4533 Patients are seen by appointment only. Walk-ins are not accepted. [facility] will see patients 18 years of age and older. °One   Wednesday Evening (Monthly: Volunteer Based).  $30 per visit, cash only  °[facility]  (919) 537-3737 for adults; Children under age 4, call Graduate Pediatric Dentistry at (919) [phone]. Children aged 4-14, please call (919) [phone] to request a pediatric application. ° Dental services are provided in all areas of dental care including fillings, crowns and bridges, complete and partial dentures, implants, gum treatment, root canals, and extractions. Preventive care is also provided. Treatment is provided to both adults and children. °Patients are selected via a lottery and there is often a waiting list. °  °[facility] [address], °[place] ° (336) 763-8833 [email or web address] °  °[facility] [address], [place], [place] (336)723-1848, Ext. 123 Second and Fourth Thursday of each month, opens at 6:30 AM; Clinic ends at 9 AM.  Patients are seen on a first-come first-served basis, and a limited number are seen during each clinic.  ° °[facility] ° [address], [place], [place] (336) 723-7904    Eligibility Requirements °You must have lived in [place], [place], or [place] counties for at least the last three months. °  You cannot be eligible for state or federal sponsored healthcare insurance, including Veterans Administration, Medicaid, or Medicare. °  You generally cannot be eligible for healthcare insurance through your employer.  °  How to apply: °Eligibility screenings are held every Tuesday and Wednesday afternoon from 1:00 pm until 4:00 pm. You do not need an appointment for the interview!  °[facility] [address], [place], [place] 336-631-2330   °[facility]  336-342-8273   °[facility]  336-703-3100   °[facility]  336-570-6415   ° °Behavioral Health Resources in the Community: °Intensive Outpatient Programs °Organization         Address  Phone  Notes  °[facility] [address], [place], [place] 336-878-6098   °[facility] [address], [place], [place] 336-832-9800   °[facility] [address], [place], [place] ° 336-882-2125   °[facility] [address],  °[place], [place] 1-800-853-5163 or 336-641-4981   °Substance Abuse Resources °Organization         Address  Phone  Notes  °[facility]  336-882-2125   °[facility]  336-784-9470   °[facility]  336-285-9073   °[facility]  336-845-3988   °Residential & Outpatient Substance Abuse Program  1-800-659-3381   °Psychological Services °Organization         Address  Phone  Notes  °[facility]  336- 832-9600   °[facility]  336- 378-7881   °[facility] [address], [place] 1-800-853-5163 or 336-641-4981   ° °Mobile Crisis Teams °Organization         Address  Phone  Notes  °[facility], Mobile Crisis Care Unit  1-877-626-1772   °Assertive °[facility] ° [address]  [place], [place] 336-834-9664   °Sharon DeEsch [address] °[place]  336-554-5454   ° °Self-Help/Support Groups °Organization         Address  Phone             Notes  °[facility] - variety of support groups  336- 373-1402 Call for more information  °Narcotics Anonymous (NA), [facility] [address], °[place]   2 meetings at this location  ° °  Residential Treatment Programs °Organization         Address  Phone  Notes  °[facility] [address],    °[place]  1-866-801-8205   °[facility] ° [address], [place], [place] 704-293-8524   °[facility] [address], [place] 336-845-3988 Admissions: 8am-3pm M-F  °[facility] [address]    °[place], [place] 336-841-1104   °[facility] [address], [place], [place] 336-379-7146   °[facility] [address]  °[place], [place] 336-285-9073   °[facility] Intensive Outpatient [address], [place], [place] 336-852-3033   °[facility] [address]  °[place], [place] 1-877-615-2722 or 336-784-9470   °[facility] [address] [place], [place] 336-227-7417 Accepts Medicaid  °[facility] [address]  ° [place] 1-800-659-3381 Substance Abuse/Addiction Treatment  ° °[place] Behavioral Health Resources °Organization         Address  Phone  Notes  °[facility]  (888) 581-9988   °Julie Brannon, PhD [address] [place], [place]   (336) 349-5553 or (336) 951-0000   °[facility]   [address] °[place], [place] (336) 349-4454   °[facility] [address], [place], [place] (336) 342-8316 Insurance/Medicaid/sponsorship through [facility]  °[facility] [address]                                    [place], [place] (336) 342-8316 Therapy/tele-psych/case    °[facility] [address]  ° [place],  (336) 349-2233    °Dr. Arfeen  (336) 349-4544   °[facility] [place]  [employer] [place] [facility] 1) [address], [place] °2) [address], [place] °3)  [address], [place] (336) 349-3220 °(336) 342-7768 ° °(336) 342-8140   °[place] [facility] (336) 342-1394 or (336) 342-3537 (After Hours)    ° ° °

## 2015-06-11 NOTE — ED Notes (Signed)
The pt has left sided chest pain for months.  She heard a bubbling in her lt lung tonight so she decided to have it checked.  Pneumothorax last year.  lmp dec 10

## 2015-08-31 DIAGNOSIS — F314 Bipolar disorder, current episode depressed, severe, without psychotic features: Secondary | ICD-10-CM | POA: Diagnosis not present

## 2015-09-03 DIAGNOSIS — F314 Bipolar disorder, current episode depressed, severe, without psychotic features: Secondary | ICD-10-CM | POA: Diagnosis not present

## 2015-09-15 ENCOUNTER — Encounter (HOSPITAL_COMMUNITY): Payer: Self-pay

## 2015-09-15 ENCOUNTER — Emergency Department (HOSPITAL_COMMUNITY)
Admission: EM | Admit: 2015-09-15 | Discharge: 2015-09-15 | Disposition: A | Discharge status: Home / Self Care | Payer: Medicare Other | Attending: Emergency Medicine | Admitting: Emergency Medicine

## 2015-09-15 DIAGNOSIS — N898 Other specified noninflammatory disorders of vagina: Secondary | ICD-10-CM | POA: Diagnosis not present

## 2015-09-15 DIAGNOSIS — F17210 Nicotine dependence, cigarettes, uncomplicated: Secondary | ICD-10-CM | POA: Insufficient documentation

## 2015-09-15 DIAGNOSIS — N760 Acute vaginitis: Secondary | ICD-10-CM | POA: Diagnosis not present

## 2015-09-15 DIAGNOSIS — F329 Major depressive disorder, single episode, unspecified: Secondary | ICD-10-CM | POA: Diagnosis not present

## 2015-09-15 DIAGNOSIS — A5901 Trichomonal vulvovaginitis: Secondary | ICD-10-CM | POA: Diagnosis not present

## 2015-09-15 DIAGNOSIS — A599 Trichomoniasis, unspecified: Secondary | ICD-10-CM

## 2015-09-15 DIAGNOSIS — B9689 Other specified bacterial agents as the cause of diseases classified elsewhere: Secondary | ICD-10-CM

## 2015-09-15 LAB — RAPID HIV SCREEN (HIV 1/2 AB+AG)
HIV 1/2 Antibodies: NONREACTIVE
HIV-1 P24 Antigen - HIV24: NONREACTIVE

## 2015-09-15 LAB — WET PREP, GENITAL
SPERM: NONE SEEN
YEAST WET PREP: NONE SEEN

## 2015-09-15 MED ORDER — STERILE WATER FOR INJECTION IJ SOLN
INTRAMUSCULAR | Status: AC
  Administered 2015-09-15: 0.9 mL via INTRAMUSCULAR
  Filled 2015-09-15: qty 10

## 2015-09-15 MED ORDER — STERILE WATER FOR INJECTION IJ SOLN
1.0000 mL | Freq: Once | INTRAMUSCULAR | Status: AC
  Administered 2015-09-15: 0.9 mL via INTRAMUSCULAR

## 2015-09-15 MED ORDER — AZITHROMYCIN 250 MG PO TABS
1000.0000 mg | ORAL_TABLET | Freq: Once | ORAL | Status: AC
  Administered 2015-09-15: 1000 mg via ORAL
  Filled 2015-09-15: qty 4

## 2015-09-15 MED ORDER — CEFTRIAXONE SODIUM 250 MG IJ SOLR
250.0000 mg | Freq: Once | INTRAMUSCULAR | Status: AC
  Administered 2015-09-15: 250 mg via INTRAMUSCULAR
  Filled 2015-09-15: qty 250

## 2015-09-15 MED ORDER — METRONIDAZOLE 500 MG PO TABS: 500.0000 mg | ORAL_TABLET | Freq: Two times a day (BID) | ORAL | Status: DC

## 2015-09-15 NOTE — ED Provider Notes (Signed)
CSN: [id number]     Arrival date & time 09/15/15  1204 History   First MD Initiated Contact with Patient 09/15/15 1332     Chief Complaint  Patient presents with  . Vaginal Discharge     (Consider location/radiation/quality/duration/timing/severity/associated sxs/prior Treatment) HPI   Deanna Klein is a 40 y.o. female, with a history of substance abuse and depression, presenting to the ED with vaginal discharge, odor, and itching for the past two months. Pt has not been seen for this complaint before. Pt denies abdominal pain, fever/chills, N/V, dysuria, or any other complaints. LMP was 2 weeks ago.    Past Medical History  Diagnosis Date  . Depression   . Substance abuse     crack cocaine  . Seizures (HCC)   . Cocaine abuse    Past Surgical History  Procedure Laterality Date  . Right lung collapse     No family history on file. Social History  Substance Use Topics  . Smoking status: Current Every Day Smoker -- 1.00 packs/day for 19 years    Types: Cigarettes  . Smokeless tobacco: Never Used  . Alcohol Use: No   OB History    Gravida Para Term Preterm AB TAB SAB Ectopic Multiple Living   4 3 3  1  1   3      Review of Systems  Constitutional: Negative for fever and chills.  Gastrointestinal: Negative for nausea, vomiting and abdominal pain.  Genitourinary: Positive for vaginal discharge. Negative for dysuria.  Musculoskeletal: Negative for back pain.  All other systems reviewed and are negative.     Allergies  Darvocet and Percocet  Home Medications   Prior to Admission medications   Medication Sig Start Date End Date Taking? Authorizing Provider  divalproex (DEPAKOTE) 500 MG DR tablet Take 500 mg by mouth once. Pt took this from an old rx. Pt was on the rx over a year ago.   Yes Historical Provider, MD  metroNIDAZOLE (FLAGYL) 500 MG tablet Take 1 tablet (500 mg total) by mouth 2 (two) times daily. 09/15/15   Shawn C Joy, PA-C   BP 95/58 mmHg  Pulse 90   Temp(Src) 98 F (36.7 C) (Oral)  Resp 20  Ht 5\' 4"  (1.626 m)  Wt 46.72 kg  BMI 17.67 kg/m2  SpO2 100% Physical Exam  Constitutional: She appears well-developed and well-nourished. No distress.  HENT:  Head: Normocephalic and atraumatic.  Eyes: Conjunctivae are normal.  Neck: Neck supple.  Cardiovascular: Normal rate, regular rhythm, normal heart sounds and intact distal pulses.   Pulmonary/Chest: Effort normal and breath sounds normal. No respiratory distress.  Abdominal: Soft. There is no tenderness. There is no guarding.  Genitourinary:  External genitalia normal Vagina with discharge Cervix  abnormal  Red and friable positive for cervical motion tenderness Adnexa palpated, no masses or negative for tenderness noted Bladder palpated negative for tenderness Uterus palpated no masses or negative for tenderness Otherwise normal female genitalia. RNs, Efraim Kaufmann and Apolinar Junes, served as Biomedical engineer during exam.   Musculoskeletal: She exhibits no edema or tenderness.  Lymphadenopathy:    She has no cervical adenopathy.  Neurological: She is alert.  Skin: Skin is warm and dry. She is not diaphoretic.  Psychiatric: She has a normal mood and affect. Her behavior is normal.  Nursing note and vitals reviewed.   ED Course  Pelvic exam Date/Time: 09/15/2015 1:59 PM Performed by: Anselm Pancoast Authorized by: Harolyn Rutherford C Consent: Verbal consent obtained. Risks and benefits: risks, benefits  and alternatives were discussed Consent given by: patient Patient understanding: patient states understanding of the procedure being performed Patient consent: the patient's understanding of the procedure matches consent given Procedure consent: procedure consent matches procedure scheduled Patient identity confirmed: verbally with patient and arm band Local anesthesia used: no Patient sedated: no Patient tolerance: Patient tolerated the procedure well with no immediate complications   (including  critical care time) Labs Review Labs Reviewed  WET PREP, GENITAL - Abnormal; Notable for the following:    Trich, Wet Prep PRESENT (*)    Clue Cells Wet Prep HPF POC PRESENT (*)    WBC, Wet Prep HPF POC MANY (*)    All other components within normal limits  RAPID HIV SCREEN (HIV 1/2 AB+AG)  GC/CHLAMYDIA PROBE AMP ([place]) NOT AT [facility]    Imaging Review No results found. I have personally reviewed and evaluated these lab results as part of my medical decision-making.   EKG Interpretation None      MDM   Final diagnoses:  Vaginal discharge  Trichomonas infection  BV (bacterial vaginosis)    Deanna Klein presents with abnormal vaginal discharge, odor, and itching for the last 2 months.  Pelvic exam and appropriate labs were obtained. Trichomonas and bacterial vaginosis apparent on wet prep. Patient was also prophylactically treated for GC/chlamydia. Patient understands that any further abnormal results will be called to her. Follow-up care and return precautions discussed. Patient voiced understanding of these instructions, accepts the plan, and is comfortable with discharge.  Filed Vitals:   09/15/15 1244 09/15/15 1500 09/15/15 1603  BP:  Pulse: 80 64 90  Temp: 97.6 F (36.4 C)  98 F (36.7 C)  TempSrc: Oral  Oral  Resp: 18  20  Height:  (1.626 m)    Weight: 46.72 kg    SpO2:  99% 100%       Anselm Pancoast, PA-C 09/15/15 1832  Azalia Bilis, MD 09/16/15 [phone]

## 2015-09-15 NOTE — ED Notes (Signed)
Pt. Having vaginal discharge for 2 months.  She has odor and itching.  She is on her cycle at this time.  She reports that her cycles have been irregular and lasting longer

## 2015-09-15 NOTE — Discharge Instructions (Signed)
You have been seen today for vaginal discharge. Your lab show evidence of Trichomonas, a sexually transmitted disease, as well as bacterial vaginosis, which is not a sexually transmitted disease. The antibiotic prescribed will treat both of these. Any other lab abnormalities will be called to you if they are abnormal. You have also been treated prophylactically for other STDs while here in the ED. Follow up with PCP as needed if symptoms continue. Return to ED should symptoms worsen.  RESOURCE GUIDE  Chronic Pain Problems: Contact [facility]  [phone] Patients need to be referred by their primary care doctor.  Insufficient Money for Medicine: Contact United Way:  call "211" or [facility] 364-690-5720.  No Primary Care Doctor: - Call [facility]  [phone] - can help you locate a primary care doctor that  accepts your insurance, provides certain services, etc. - Physician Referral Service- [phone]  Agencies that provide inexpensive medical care: - [facility]  [phone] - [facility]  [phone] - [facility]  [phone] - Women's Clinic  [phone] - Planned Parenthood  [phone] [facility]  [phone]  Medicaid-accepting [facility] Providers: - [facility]- [address]  [phone], Mon-Fri 9am-7pm, Sat 9am-1pm - [facility]- [address] [place], Suite [place]  [phone] - [facility]- [address] [place]  [phone] [facility]- [address]  [phone] - Renaye Rakers- [address] [place], [address], [phone]  Only accepts [place] patients after they have their name  applied to their card  Self Pay (no insurance) in [place]: - Sickle Cell Patients: Dr Willey Blade, [facility]  [address] [place], [phone] - [facility]- [address] [place]  [phone]       [facility] [place]- [address]       -     [facility]- see information above (Speak to [employer] if you do not have insurance)       -  [facility]- [address] [place], [phone]       -  [facility]- [address],  [phone]       -  [facility]- [address], [phone]       -  Dr Julio Sicks-  [address], [place], [phone]       -  [facility]- [address] [phone]       -  [facility]- [address] [phone], also [address], [phone]       -    [facility]- [address] [place], [phone], 1st & 3rd Saturday   every month, 10am-1pm  1) Find a Doctor and Pay Out of Pocket Although you won't have to find out who is covered by your insurance plan, it is a good idea to ask around and get recommendations. You will then need to call the office and see if the doctor you have chosen will accept you as a new patient and what types of options they offer for patients who are self-pay. Some doctors offer discounts or will set up payment plans for their patients who do not have insurance, but you will need to ask  so you aren't surprised when you get to your appointment.  2) Contact Your Local Health Department Not all health departments have doctors that can see patients for sick visits, but many do, so it is worth a call to see if yours does. If you don't know where your local health department is, you can check in your phone book. The CDC also has a tool to help you locate your state's health department, and many state websites also have listings of all of their local health departments.  3) Find a Walk-in Clinic If your illness is not likely to be very severe or complicated, you may want to try a walk in clinic. These are popping up all over the country in pharmacies, drugstores, and shopping centers. They're usually staffed  by nurse practitioners or physician assistants that have been trained to treat common illnesses and complaints. They're usually fairly quick and inexpensive. However, if you have serious medical issues or chronic medical problems, these are probably not your best option  STD Testing - [facility] Department of [facility], STD Clinic, [address], [place], phone [phone] or [phone].  Monday - Friday, call for an appointment. [facility] Department of [employer], STD Clinic, [address], [place], phone [phone] or [phone].  Monday - Friday, call for an appointment.  Abuse/Neglect: [facility] Child Abuse Hotline (803)532-8683 [facility] Child Abuse Hotline 336 245 7081 (After Hours)  Emergency Shelter:  [facility] (858) 576-8371  Maternity Homes: - [facility] at [facility] [place] [facility] [phone] - [facility] [phone]  MRSA Hotline #:   (819)007-0243  [facility] Resources  [facility]  United Way [facility] Dept. [address]                 [address]         [address] [place] [address]  Blondell Reveal Phone:  [phone]                                  Phone:  [phone]                   Phone:  [phone]  [facility] Mental [facility], [phone] - [facility] - [phone]       -     [facility] in [place], [address],                                  [phone], [facility] Child Abuse Hotline (978) 579-8146 or (947)275-6470 (After Hours)   Behavioral Health Services  Substance Abuse Resources: - [facility]  970-334-9198 - [facility] (865)847-5587 - [facility] [place] (531)713-9058 Floydene Flock  [phone] - Residential & Outpatient Substance Abuse Program  (215)805-5669  Psychological Services: [facility]  [phone] Services  ([phone] - [facility], [place] [address] [place], ACCESS LINE: (647)110-8310 or 2034994541, [employer]  Dental Assistance  If unable to pay or uninsured, contact:  [facility] or [facility]. to become qualified for the adult dental clinic.  Patients with Medicaid: [facility] [id number] [address], [phone] [address] [phone]  If unable to pay, or uninsured, contact [facility] (772)286-1060) or [facility] Department [phone] in [place], [phone] in [facility]) to become qualified for the adult dental clinic   Other Low-Cost Community Dental Services: - [facility]- [address] [place], [place], [place], [address], [phone], [address], 2nd and 4th Thursday of the month at 6:30am.  10 clients each day by appointment, can sometimes see walk-in patients if someone does not show for an appointment. [facility]- [address] [place], [place], [address], [phone] - [facility]- [address] [place], [place], [address], [phone] - [place] [facility]- [phone] [facility] Health Department- [phone] [facility] Department- [phone]

## 2015-09-17 LAB — GC/CHLAMYDIA PROBE AMP (~~LOC~~) NOT AT ARMC
CHLAMYDIA, DNA PROBE: NEGATIVE
Neisseria Gonorrhea: NEGATIVE

## 2015-09-17 MED FILL — metroNIDAZOLE 500 MG TABS: 500 | 7 days supply | Qty: 14 | Fill #0

## 2015-10-05 DIAGNOSIS — F314 Bipolar disorder, current episode depressed, severe, without psychotic features: Secondary | ICD-10-CM | POA: Diagnosis not present

## 2015-10-24 DIAGNOSIS — F314 Bipolar disorder, current episode depressed, severe, without psychotic features: Secondary | ICD-10-CM | POA: Diagnosis not present

## 2015-11-13 DIAGNOSIS — F314 Bipolar disorder, current episode depressed, severe, without psychotic features: Secondary | ICD-10-CM | POA: Diagnosis not present

## 2015-12-05 DIAGNOSIS — F314 Bipolar disorder, current episode depressed, severe, without psychotic features: Secondary | ICD-10-CM | POA: Diagnosis not present

## 2016-01-07 ENCOUNTER — Emergency Department (HOSPITAL_COMMUNITY)
Admission: EM | Admit: 2016-01-07 | Discharge: 2016-01-07 | Disposition: A | Discharge status: Home / Self Care | Payer: Medicare Other | Attending: Dermatology | Admitting: Dermatology

## 2016-01-07 ENCOUNTER — Encounter (HOSPITAL_COMMUNITY): Payer: Self-pay | Admitting: *Deleted

## 2016-01-07 DIAGNOSIS — F17210 Nicotine dependence, cigarettes, uncomplicated: Secondary | ICD-10-CM | POA: Insufficient documentation

## 2016-01-07 DIAGNOSIS — Z5321 Procedure and treatment not carried out due to patient leaving prior to being seen by health care provider: Secondary | ICD-10-CM | POA: Insufficient documentation

## 2016-01-07 DIAGNOSIS — R300 Dysuria: Secondary | ICD-10-CM | POA: Insufficient documentation

## 2016-01-07 HISTORY — DX: Unspecified sexually transmitted disease: A64

## 2016-01-07 LAB — CBC
HEMATOCRIT: 41 % (ref 36.0–46.0)
Hemoglobin: 13.7 g/dL (ref 12.0–15.0)
MCH: 30.6 pg (ref 26.0–34.0)
MCHC: 33.4 g/dL (ref 30.0–36.0)
MCV: 91.5 fL (ref 78.0–100.0)
PLATELETS: 239 10*3/uL (ref 150–400)
RBC: 4.48 MIL/uL (ref 3.87–5.11)
RDW: 13.1 % (ref 11.5–15.5)
WBC: 7.9 10*3/uL (ref 4.0–10.5)

## 2016-01-07 LAB — BASIC METABOLIC PANEL
Anion gap: 3 — ABNORMAL LOW (ref 5–15)
BUN: 11 mg/dL (ref 6–20)
CO2: 26 mmol/L (ref 22–32)
CREATININE: 0.85 mg/dL (ref 0.44–1.00)
Calcium: 8.9 mg/dL (ref 8.9–10.3)
Chloride: 111 mmol/L (ref 101–111)
GFR calc Af Amer: >60 mL/min (ref >60)
GFR calc non Af Amer: >60 mL/min (ref >60)
GLUCOSE: 92 mg/dL (ref 65–99)
Potassium: 3.7 mmol/L (ref 3.5–5.1)
Sodium: 140 mmol/L (ref 135–145)

## 2016-01-07 LAB — URINALYSIS, ROUTINE W REFLEX MICROSCOPIC
BILIRUBIN URINE: NEGATIVE
GLUCOSE, UA: NEGATIVE mg/dL
KETONES UR: 15 mg/dL — AB
Nitrite: NEGATIVE
PROTEIN: 100 mg/dL — AB
Specific Gravity, Urine: 1.025 (ref 1.005–1.030)
pH: 8 (ref 5.0–8.0)

## 2016-01-07 LAB — URINE MICROSCOPIC-ADD ON

## 2016-01-07 LAB — I-STAT BETA HCG BLOOD, ED (MC, WL, AP ONLY)

## 2016-01-07 NOTE — ED Notes (Signed)
Patient left without being seen.

## 2016-01-07 NOTE — ED Notes (Addendum)
Pt c/o frequent/burning urination over 1 week. States last time she had these s/s she had an STD.

## 2016-01-16 DIAGNOSIS — F314 Bipolar disorder, current episode depressed, severe, without psychotic features: Secondary | ICD-10-CM | POA: Diagnosis not present

## 2016-04-24 DIAGNOSIS — F314 Bipolar disorder, current episode depressed, severe, without psychotic features: Secondary | ICD-10-CM | POA: Diagnosis not present

## 2017-09-09 ENCOUNTER — Emergency Department (HOSPITAL_COMMUNITY): Payer: Medicare Other

## 2017-09-09 ENCOUNTER — Other Ambulatory Visit: Payer: Self-pay

## 2017-09-09 ENCOUNTER — Encounter (HOSPITAL_COMMUNITY): Payer: Self-pay | Admitting: *Deleted

## 2017-09-09 ENCOUNTER — Emergency Department (HOSPITAL_COMMUNITY)
Admission: EM | Admit: 2017-09-09 | Discharge: 2017-09-09 | Disposition: A | Discharge status: Home / Self Care | Payer: Medicare Other | Attending: Emergency Medicine | Admitting: Emergency Medicine

## 2017-09-09 DIAGNOSIS — Z79899 Other long term (current) drug therapy: Secondary | ICD-10-CM | POA: Diagnosis not present

## 2017-09-09 DIAGNOSIS — G510 Bell's palsy: Secondary | ICD-10-CM | POA: Insufficient documentation

## 2017-09-09 DIAGNOSIS — R079 Chest pain, unspecified: Secondary | ICD-10-CM | POA: Diagnosis not present

## 2017-09-09 DIAGNOSIS — F17210 Nicotine dependence, cigarettes, uncomplicated: Secondary | ICD-10-CM | POA: Insufficient documentation

## 2017-09-09 DIAGNOSIS — J449 Chronic obstructive pulmonary disease, unspecified: Secondary | ICD-10-CM | POA: Diagnosis not present

## 2017-09-09 DIAGNOSIS — R0789 Other chest pain: Secondary | ICD-10-CM | POA: Insufficient documentation

## 2017-09-09 DIAGNOSIS — R29810 Facial weakness: Secondary | ICD-10-CM | POA: Diagnosis not present

## 2017-09-09 DIAGNOSIS — F1410 Cocaine abuse, uncomplicated: Secondary | ICD-10-CM | POA: Insufficient documentation

## 2017-09-09 LAB — URINALYSIS, ROUTINE W REFLEX MICROSCOPIC
Bilirubin Urine: NEGATIVE
Glucose, UA: NEGATIVE mg/dL
KETONES UR: NEGATIVE mg/dL
Nitrite: POSITIVE — AB
PH: 5 (ref 5.0–8.0)
Protein, ur: NEGATIVE mg/dL
SPECIFIC GRAVITY, URINE: 1.021 (ref 1.005–1.030)

## 2017-09-09 LAB — I-STAT CHEM 8, ED
BUN: 14 mg/dL (ref 6–20)
Calcium, Ion: 1.2 mmol/L (ref 1.15–1.40)
Chloride: 101 mmol/L (ref 101–111)
Creatinine, Ser: 0.7 mg/dL (ref 0.44–1.00)
GLUCOSE: 97 mg/dL (ref 65–99)
HEMATOCRIT: 47 % — AB (ref 36.0–46.0)
HEMOGLOBIN: 16 g/dL — AB (ref 12.0–15.0)
POTASSIUM: 4.4 mmol/L (ref 3.5–5.1)
Sodium: 139 mmol/L (ref 135–145)
TCO2: 26 mmol/L (ref 22–32)

## 2017-09-09 LAB — DIFFERENTIAL
BASOS ABS: 0 10*3/uL (ref 0.0–0.1)
BASOS PCT: 0 %
EOS ABS: 0 10*3/uL (ref 0.0–0.7)
Eosinophils Relative: 0 %
Lymphocytes Relative: 27 %
Lymphs Abs: 2.7 10*3/uL (ref 0.7–4.0)
MONOS PCT: 4 %
Monocytes Absolute: 0.4 10*3/uL (ref 0.1–1.0)
NEUTROS ABS: 6.8 10*3/uL (ref 1.7–7.7)
NEUTROS PCT: 69 %

## 2017-09-09 LAB — COMPREHENSIVE METABOLIC PANEL
ALBUMIN: 4 g/dL (ref 3.5–5.0)
ALT: 10 U/L — AB (ref 14–54)
AST: 17 U/L (ref 15–41)
Alkaline Phosphatase: 70 U/L (ref 38–126)
Anion gap: 9 (ref 5–15)
BUN: 12 mg/dL (ref 6–20)
CO2: 24 mmol/L (ref 22–32)
CREATININE: 0.78 mg/dL (ref 0.44–1.00)
Calcium: 9.4 mg/dL (ref 8.9–10.3)
Chloride: 102 mmol/L (ref 101–111)
GFR calc Af Amer: >60 mL/min (ref >60)
GLUCOSE: 98 mg/dL (ref 65–99)
POTASSIUM: 4.4 mmol/L (ref 3.5–5.1)
SODIUM: 135 mmol/L (ref 135–145)
Total Bilirubin: 0.3 mg/dL (ref 0.3–1.2)
Total Protein: 7.3 g/dL (ref 6.5–8.1)

## 2017-09-09 LAB — CBC
HCT: 46.3 % — ABNORMAL HIGH (ref 36.0–46.0)
Hemoglobin: 16 g/dL — ABNORMAL HIGH (ref 12.0–15.0)
MCH: 30.5 pg (ref 26.0–34.0)
MCHC: 34.6 g/dL (ref 30.0–36.0)
MCV: 88.4 fL (ref 78.0–100.0)
Platelets: 296 10*3/uL (ref 150–400)
RBC: 5.24 MIL/uL — ABNORMAL HIGH (ref 3.87–5.11)
RDW: 12.7 % (ref 11.5–15.5)
WBC: 10 10*3/uL (ref 4.0–10.5)

## 2017-09-09 LAB — RAPID URINE DRUG SCREEN, HOSP PERFORMED
AMPHETAMINES: NOT DETECTED
BARBITURATES: NOT DETECTED
Benzodiazepines: NOT DETECTED
COCAINE: POSITIVE — AB
Opiates: NOT DETECTED
TETRAHYDROCANNABINOL: NOT DETECTED

## 2017-09-09 LAB — I-STAT TROPONIN, ED: Troponin i, poc: 0 ng/mL (ref 0.00–0.08)

## 2017-09-09 LAB — I-STAT BETA HCG BLOOD, ED (MC, WL, AP ONLY)

## 2017-09-09 NOTE — ED Notes (Signed)
Pt in hallway, unable to sign for discharge. Pt verbalizes consent for discharge

## 2017-09-09 NOTE — ED Provider Notes (Signed)
Patient placed in Quick Look pathway, seen and evaluated   Chief Complaint: chronic chest pain and facial droop  HPI:  Noticed left side of face "turned" over 1 week ago including eye lid and corner of both, thinks she had a stroke but did not want to come to ED associated with watery discharge from left eye, hard to close her left eye to sleep. Also notes ongoing, chronic CP for 1 year, left sided, sharp/pressure, non radiating worse with movement. Tobacco abuse, cocaine. No IVDU.    ROS: No fevers, no numbess or heaviness to extremities.   Physical Exam:   Gen: No distress  Neuro: Awake and Alert  Skin: Warm    Focused Exam: left sided facial droop, cannot close left eye lid fully. Speech is fluent without obvious dysarthria or dysphasia. Strength 5/5 with hand grip and ankle F/E.  Sensation to light touch intact in hands and feet. Normal gait. Normal tandem walk. Normal finger-to-nose and finger tapping. CN I and VIII not tested. CN II-XII otherwise grossly intact bilaterally. RRR. 2+ Dp and radial pulses bil. No LE edema.   Initiation of care has begun. The patient has been counseled on the process, plan, and necessity for staying for the completion/evaluation, and the remainder of the medical screening examination    Liberty Handy, PA-C 09/09/17 1513    Melene Plan, DO 09/09/17 2301

## 2017-09-09 NOTE — ED Provider Notes (Signed)
[facility] EMERGENCY DEPARTMENT Provider Note   CSN: [id number] Arrival date & time: 09/09/17  1418     History   Chief Complaint Chief Complaint  Patient presents with  . Chest Pain  . Facial Droop    HPI Deanna Klein is a 42 y.o. female.  She presents for evaluation of facial drooping left-sided present for 1 week with some difficulty talking.  She has noticed that she is unable to close her left eye, completely.  She denies problems with arms or legs, gait disorder, headache, neck pain, nausea, vomiting, fever or chills.  She has had some intermittent chest pain for several days without shortness of breath or cough.  No prior similar problems.  HPI  Past Medical History:  Diagnosis Date  . Cocaine abuse (HCC)   . Depression   . Seizures (HCC)   . STD (female)   . Substance abuse (HCC)    crack cocaine    Patient Active Problem List   Diagnosis Date Noted  . Necrotizing pneumonia (HCC) 02/02/2014  . Lung abscess (HCC) 02/02/2014  . Solitary pulmonary nodule 02/02/2014  . COPD (chronic obstructive pulmonary disease) (HCC) 02/02/2014  . Seizure disorder (HCC) 02/02/2014  . Depression 02/02/2014  . Polysubstance abuse (HCC) 02/02/2014  . Normocytic anemia 02/02/2014  . Protein-calorie malnutrition, severe (HCC) 03/07/2013  . Cocaine abuse (HCC) 03/06/2013  . Smoking 03/06/2013  . Ovarian cyst 05/30/2011    Past Surgical History:  Procedure Laterality Date  . right lung collapse      OB History    Gravida Para Term Preterm AB Living   4 3 3   1 3    SAB TAB Ectopic Multiple Live Births   1               Home Medications    Prior to Admission medications   Medication Sig Start Date End Date Taking? Authorizing Provider  divalproex (DEPAKOTE) 500 MG DR tablet Take 500 mg by mouth once. Pt took this from an old rx. Pt was on the rx over a year ago.    [provider]  metroNIDAZOLE (FLAGYL) 500 MG tablet Take 1 tablet (500  mg total) by mouth 2 (two) times daily. 09/15/15   Anselm Pancoast, PA-C    Family History No family history on file.  Social History Social History   Tobacco Use  . Smoking status: Current Every Day Smoker    Packs/day: 1.00    Years: 19.00    Pack years: 19.00    Types: Cigarettes  . Smokeless tobacco: Never Used  Substance Use Topics  . Alcohol use: No  . Drug use: Yes    Types: "Crack" cocaine, Cocaine    Comment: crack-couple months sober     Allergies   Darvocet [propoxyphene n-acetaminophen] and Percocet [oxycodone-acetaminophen]   Review of Systems Review of Systems  All other systems reviewed and are negative.    Physical Exam Updated Vital Signs BP (!) 106/93 (BP Location: Left Wrist)   Pulse (!) 108   Temp 98.2 F (36.8 C) (Oral)   Resp 14   LMP 09/02/2017   SpO2 96%   Physical Exam  Constitutional: She is oriented to person, place, and time. She appears well-developed and well-nourished. She does not appear ill.  HENT:  Head: Normocephalic and atraumatic.  Eyes: Conjunctivae and EOM are normal. Pupils are equal, round, and reactive to light.  Neck: Normal range of motion and phonation normal. Neck supple.  Cardiovascular: Normal rate and regular rhythm.  Pulmonary/Chest: Effort normal and breath sounds normal. She exhibits no tenderness.  Abdominal: Soft. She exhibits no distension. There is no tenderness. There is no guarding.  Musculoskeletal: Normal range of motion.  Neurological: She is alert and oriented to person, place, and time. She exhibits normal muscle tone.  No dysarthria, or aphasia.  Left facial droop including forehead and cheek.  Normal strength arms and legs bilaterally.  Normal gait.  No ataxia.  Skin: Skin is warm and dry.  Psychiatric: She has a normal mood and affect. Her behavior is normal. Judgment and thought content normal.  Nursing note and vitals reviewed.    ED Treatments / Results  Labs (all labs ordered are listed,  but only abnormal results are displayed) Labs Reviewed  CBC - Abnormal; Notable for the following components:      Result Value   RBC 5.24 (*)    Hemoglobin 16.0 (*)    HCT 46.3 (*)    All other components within normal limits  COMPREHENSIVE METABOLIC PANEL - Abnormal; Notable for the following components:   ALT 10 (*)    All other components within normal limits  RAPID URINE DRUG SCREEN, HOSP PERFORMED - Abnormal; Notable for the following components:   Cocaine POSITIVE (*)    All other components within normal limits  URINALYSIS, ROUTINE W REFLEX MICROSCOPIC - Abnormal; Notable for the following components:   APPearance HAZY (*)    Hgb urine dipstick MODERATE (*)    Nitrite POSITIVE (*)    Leukocytes, UA LARGE (*)    Bacteria, UA MANY (*)    Squamous Epithelial / LPF 0-5 (*)    Non Squamous Epithelial 0-5 (*)    All other components within normal limits  I-STAT CHEM 8, ED - Abnormal; Notable for the following components:   Hemoglobin 16.0 (*)    HCT 47.0 (*)    All other components within normal limits  DIFFERENTIAL  I-STAT TROPONIN, ED  I-STAT BETA HCG BLOOD, ED (MC, [facility], AP ONLY)  CBG MONITORING, ED    EKG  EKG Interpretation  Date/Time:  Wednesday September 09 2017 14:55:43 EDT Ventricular Rate:  100 PR Interval:  114 QRS Duration: 68 QT Interval:  358 QTC Calculation: 461 R Axis:   80 Text Interpretation:  Normal sinus rhythm Right atrial enlargement Borderline ECG since last tracing no significant change Confirmed by Mancel Bale [phone]) on 09/09/2017 6:12:50 PM       Radiology Dg Chest 2 View  Result Date: 09/09/2017 CLINICAL DATA:  LEFT side chest pain and LEFT arm pain for months, history of collapsed RIGHT lung, emphysema, smoker EXAM: CHEST - 2 VIEW COMPARISON:  06/11/2015 FINDINGS: Normal heart size, mediastinal contours, and pulmonary vascularity. Emphysematous, bronchitic and RIGHT upper lobe bullous disease changes consistent with COPD. Chronic  interstitial thickening throughout both lungs slightly more prominent than on previous exam question progression of chronic interstitial lung disease. EKG leads project over chest. No definite segmental consolidation, pleural effusion or pneumothorax. Bones demineralized. IMPRESSION: Changes of COPD and progressive chronic interstitial lung disease since prior exam. No definite acute infiltrate. Electronically Signed   By: Ulyses Southward M.D.   On: 09/09/2017 16:16   Ct Head Wo Contrast  Result Date: 09/09/2017 CLINICAL DATA:  Left-sided facial droop for 1 week EXAM: CT HEAD WITHOUT CONTRAST TECHNIQUE: Contiguous axial images were obtained from the base of the skull through the vertex without intravenous contrast. COMPARISON:  Report 01/07/2001 FINDINGS: Brain: No  acute territorial infarction, hemorrhage or intracranial mass is visualized. The ventricles are nonenlarged. Vascular: No hyperdense vessels.  No unexpected calcification Skull: Normal. Negative for fracture or focal lesion. Sinuses/Orbits: No acute finding. Mild mucosal thickening in the ethmoid sinuses. Other: None. IMPRESSION: Negative non contrasted CT appearance of the brain. Electronically Signed   By: Jasmine Pang M.D.   On: 09/09/2017 18:46    Procedures Procedures (including critical care time)  Medications Ordered in ED Medications - No data to display   Initial Impression / Assessment and Plan / ED Course  I have reviewed the triage vital signs and the nursing notes.  Pertinent labs & imaging results that were available during my care of the patient were reviewed by me and considered in my medical decision making (see chart for details).  Clinical Course as of Sep 09 1913  Wed Sep 09, 2017  1818 Evaluation for weakness initiated by APP: Urine drug screen with cocaine present, urinalysis with positive nitrite, TNTC WBC, and many bacteria, consistent with UTI.  CBC and Sinemet are normal.  HCG, negative pregnancy.  Chest x-ray  consistent with COPD, no evidence for pneumonia or heart failure.  EKG is reassuring without evidence for ischemia or infarct.  [EW]    Clinical Course User Index [EW] Mancel Bale, MD     Patient Vitals for the past 24 hrs:  BP Temp Temp src Pulse Resp SpO2  09/09/17 1703 (!) 106/93 - - (!) 108 14 96 %  09/09/17 1458 (!) 110/96 98.2 F (36.8 C) Oral (!) 106 16 96 %    7:15 PM Reevaluation with update and discussion. After initial assessment and treatment, an updated evaluation reveals she remains comfortable, is ambulatory in no distress.  Findings discussed with the patient and all questions were answered. Mancel Bale   Medical decision making subacute symptoms, consistent with isolated left facial nerve palsy.  Doubt CVA, ACS, PE or pneumonia.  No indication for further evaluation or treatment in the ED or hospital setting.  Nursing Notes Reviewed/ Care Coordinated Applicable Imaging Reviewed Interpretation of Laboratory Data incorporated into ED treatment  The patient appears reasonably screened and/or stabilized for discharge and I doubt any other medical condition or other [facility] requiring further screening, evaluation, or treatment in the ED at this time prior to discharge.  Plan: Home Medications-OTC analgesia as needed, Lacri-Lube for eye; Home Treatments-a blood sugar at night; return here if the recommended treatment, does not improve the symptoms; Recommended follow up-PCP follow-up 1-2 weeks.   Final Clinical Impressions(s) / ED Diagnoses   Final diagnoses:  Bell's palsy  Cocaine abuse (HCC)  Chest pain, unspecified type    ED Discharge Orders    None       Mancel Bale, MD 09/10/17 1132

## 2017-09-09 NOTE — ED Triage Notes (Signed)
To ED for eval of intermittent cp and left face numbness/paralysis for past week. Strength in arms and legs strong and equal. Unable to close both eyes. Pain worse with movement. Denies fevers. No numbness or tingling in extremities.

## 2017-09-09 NOTE — Discharge Instructions (Signed)
The testing today does not indicate that you have had a stroke.  You probably have Bell's palsy which is caused a weakness of the left facial nerve.  Because you cannot close her left eye, it is important to use a moisturizing eyedrops such as Lacri-Lube every 1-2 hours to keep your eye moist.  At nighttime sometimes it helps to tape your eyes shut to help avoid injury to the eye.  You should avoid using illegal drugs.  Make sure you are getting plenty of rest and drinking a lot of fluids and eating 3 regular meals each day.  Follow-up with the doctor of your choice, for a checkup in 1-2 weeks.  Return here, if needed, for problems.

## 2019-12-21 IMAGING — CT CT HEAD W/O CM
3 series · 16 of 47 positions shown, 19 images · non-contrast
Comparison: Report 01/07/2001

CLINICAL DATA: Left-sided facial droop for 1 week

EXAM:
CT HEAD WITHOUT CONTRAST
TECHNIQUE: Contiguous axial images were obtained from the base of the skull
through the vertex without intravenous contrast.

[Series 3: head 5.0 h30s · axial · 0.40mm/px · z∈[+823,+953]mm · 10 of 32 slices shown, 13 images]
[im 3/32  brain]
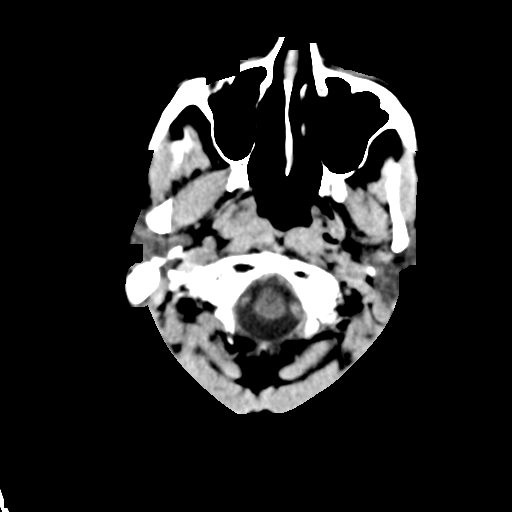
[im 3/32  bone]
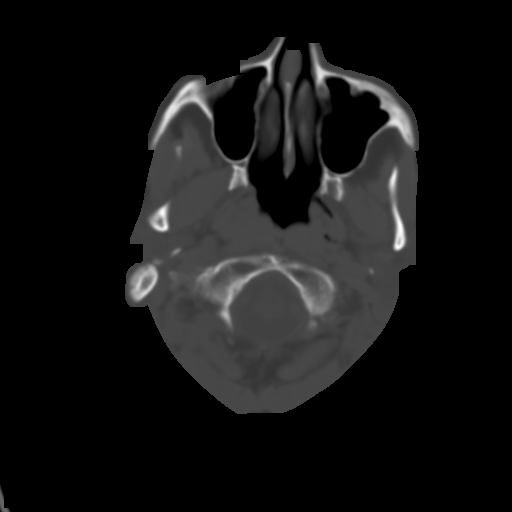
[im 6/32  brain]
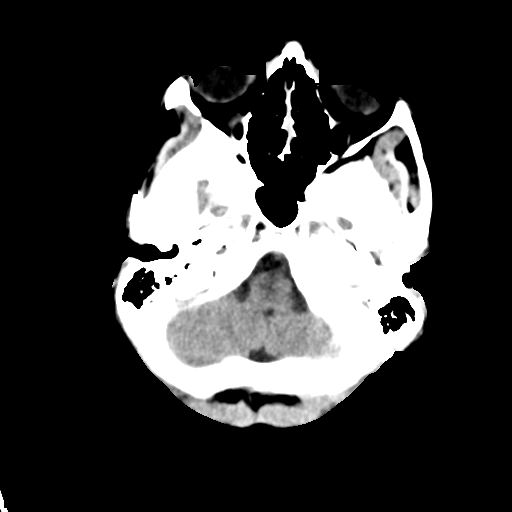
[im 9/32  brain]
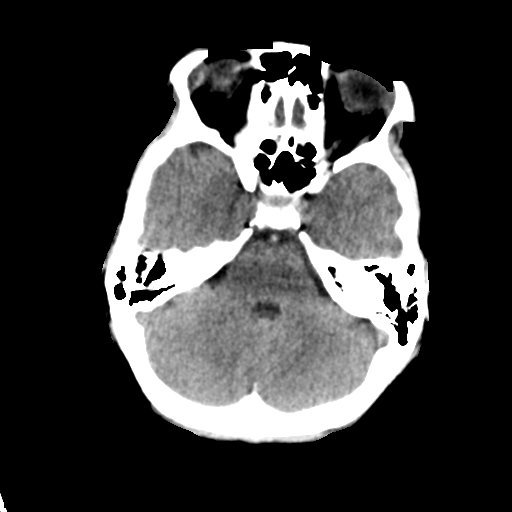
[im 11/32  brain]
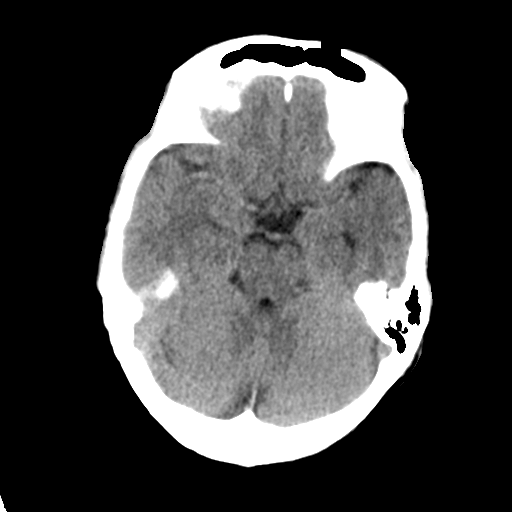
[im 14/32  brain]
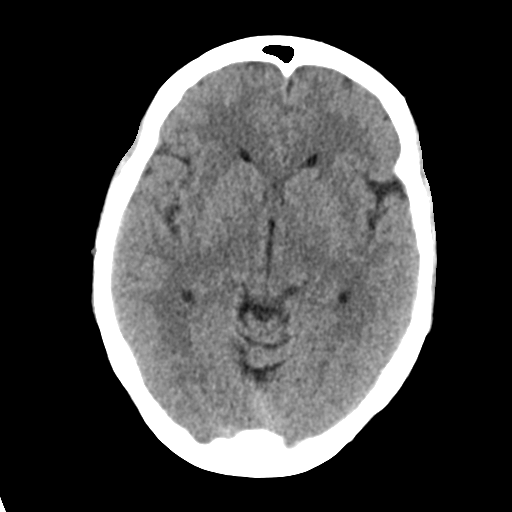
[im 14/32  bone]
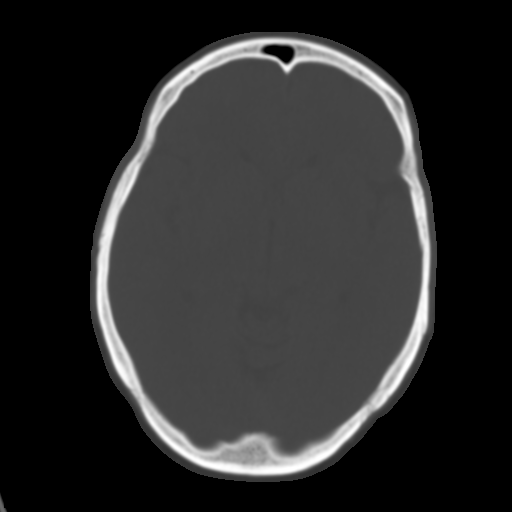
[im 18/32  brain]
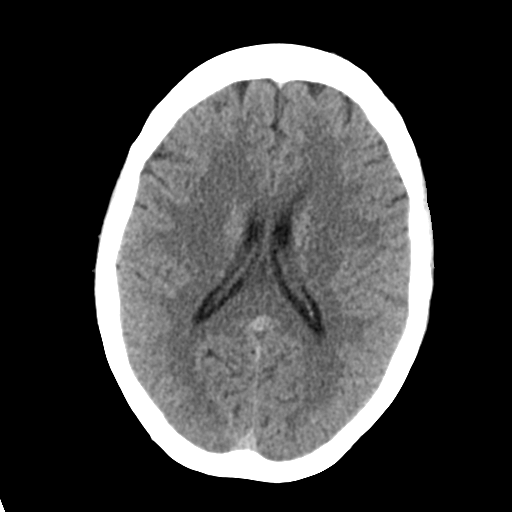
[im 21/32  brain]
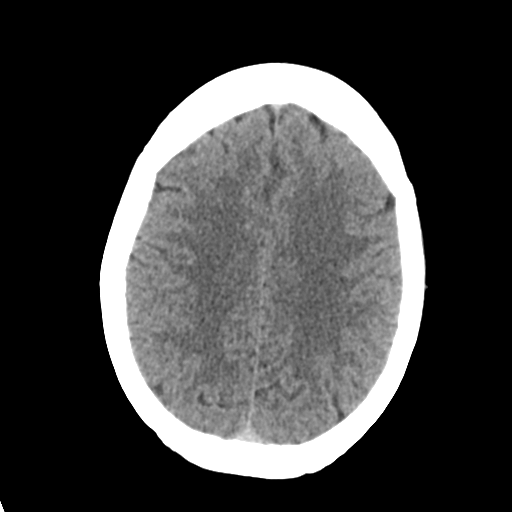
[im 24/32  brain]
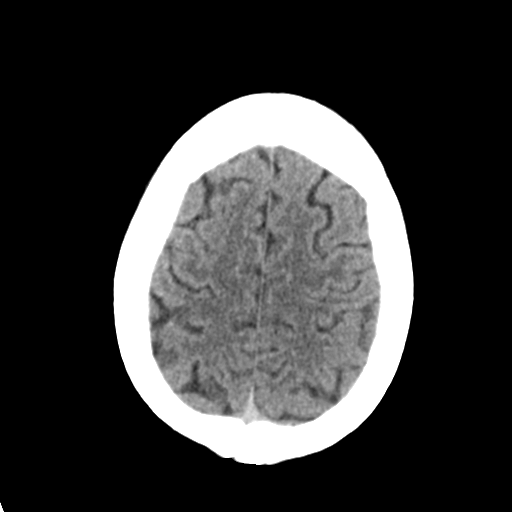
[im 26/32  brain]
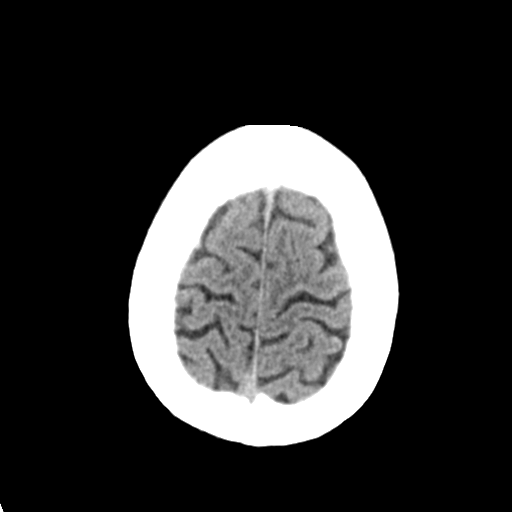
[im 26/32  bone]
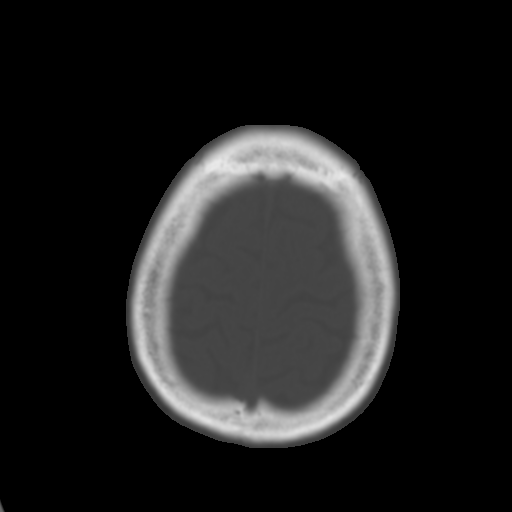
[im 29/32  brain]
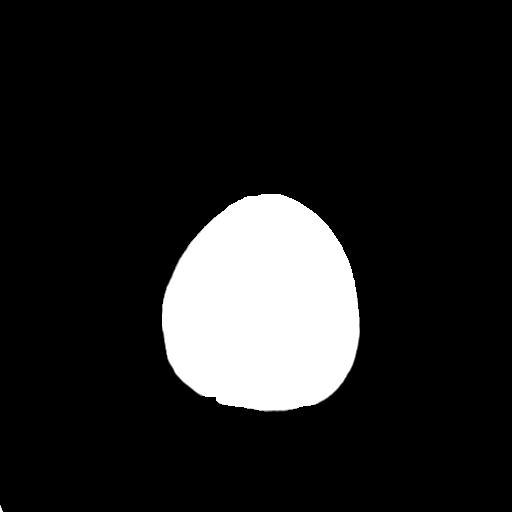

[Series 5: head 3.0 mpr cor · coronal · 0.31mm/px · 3 of 67 slices shown]
[im 23/67  brain]
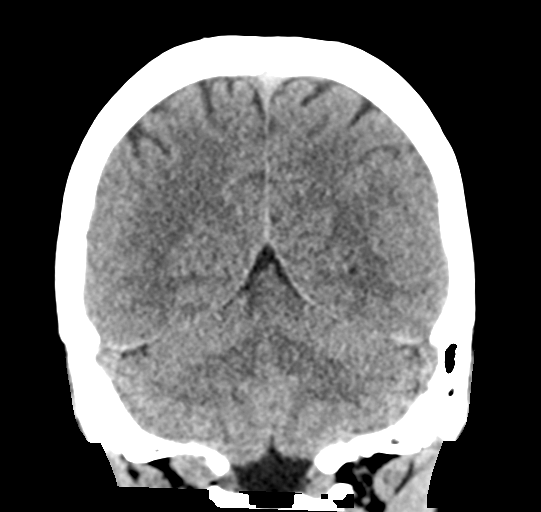
[im 30/67  brain]
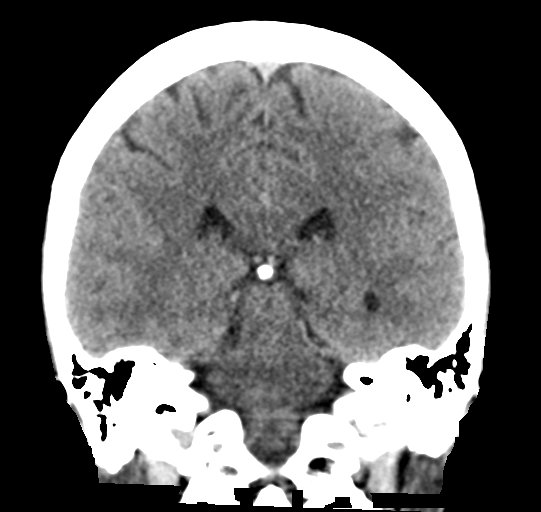
[im 37/67  brain]
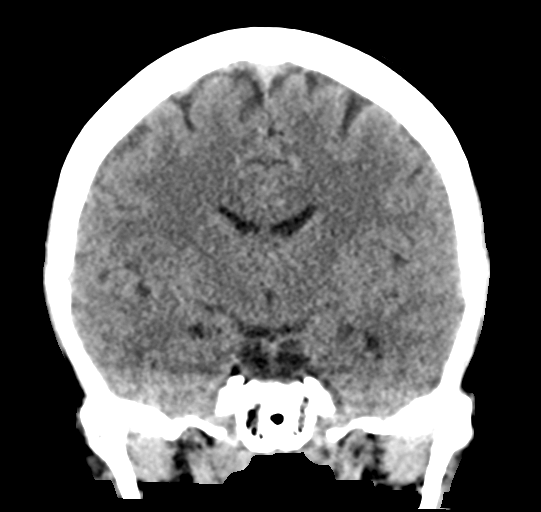

[Series 6: head 3.0 mpr sag · sagittal · 0.31mm/px · 3 of 53 slices shown]
[im 18/53  brain]
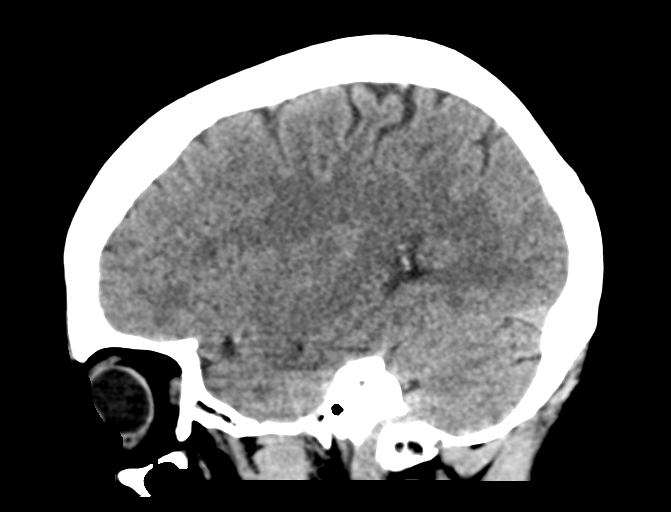
[im 27/53  brain]
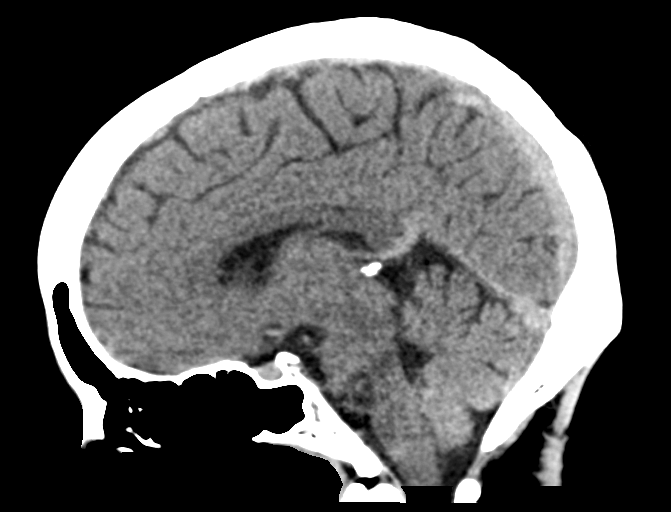
[im 35/53  brain]
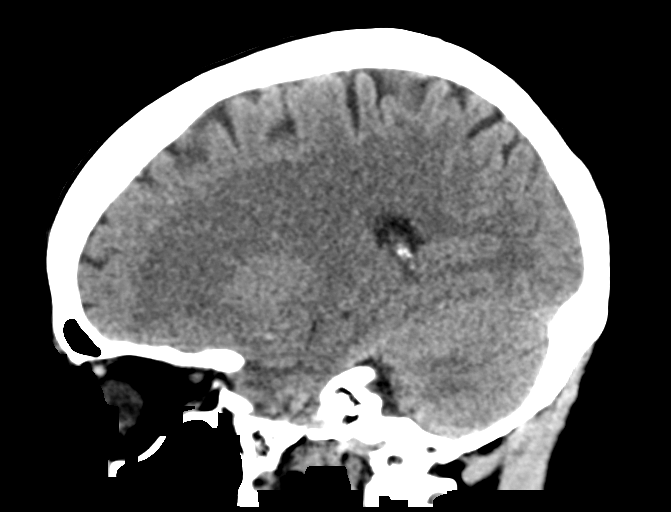

[16 of 47 positions shown; findings below may reference images not displayed]

FINDINGS: Brain: No acute territorial infarction, hemorrhage or intracranial
mass is visualized. The ventricles are nonenlarged.

Vascular: No hyperdense vessels.  No unexpected calcification

Skull: Normal. Negative for fracture or focal lesion.

Sinuses/Orbits: No acute finding. Mild mucosal thickening in the
ethmoid sinuses.

Other: None.
IMPRESSION: Negative non contrasted CT appearance of the brain.

## 2019-12-21 IMAGING — DX DG CHEST 2V
2 series · 2 of 2 positions shown · non-contrast
Comparison: 06/11/2015

CLINICAL DATA: LEFT side chest pain and LEFT arm pain for months,
history of collapsed RIGHT lung, emphysema, smoker

EXAM:
CHEST - 2 VIEW

[chest pa]
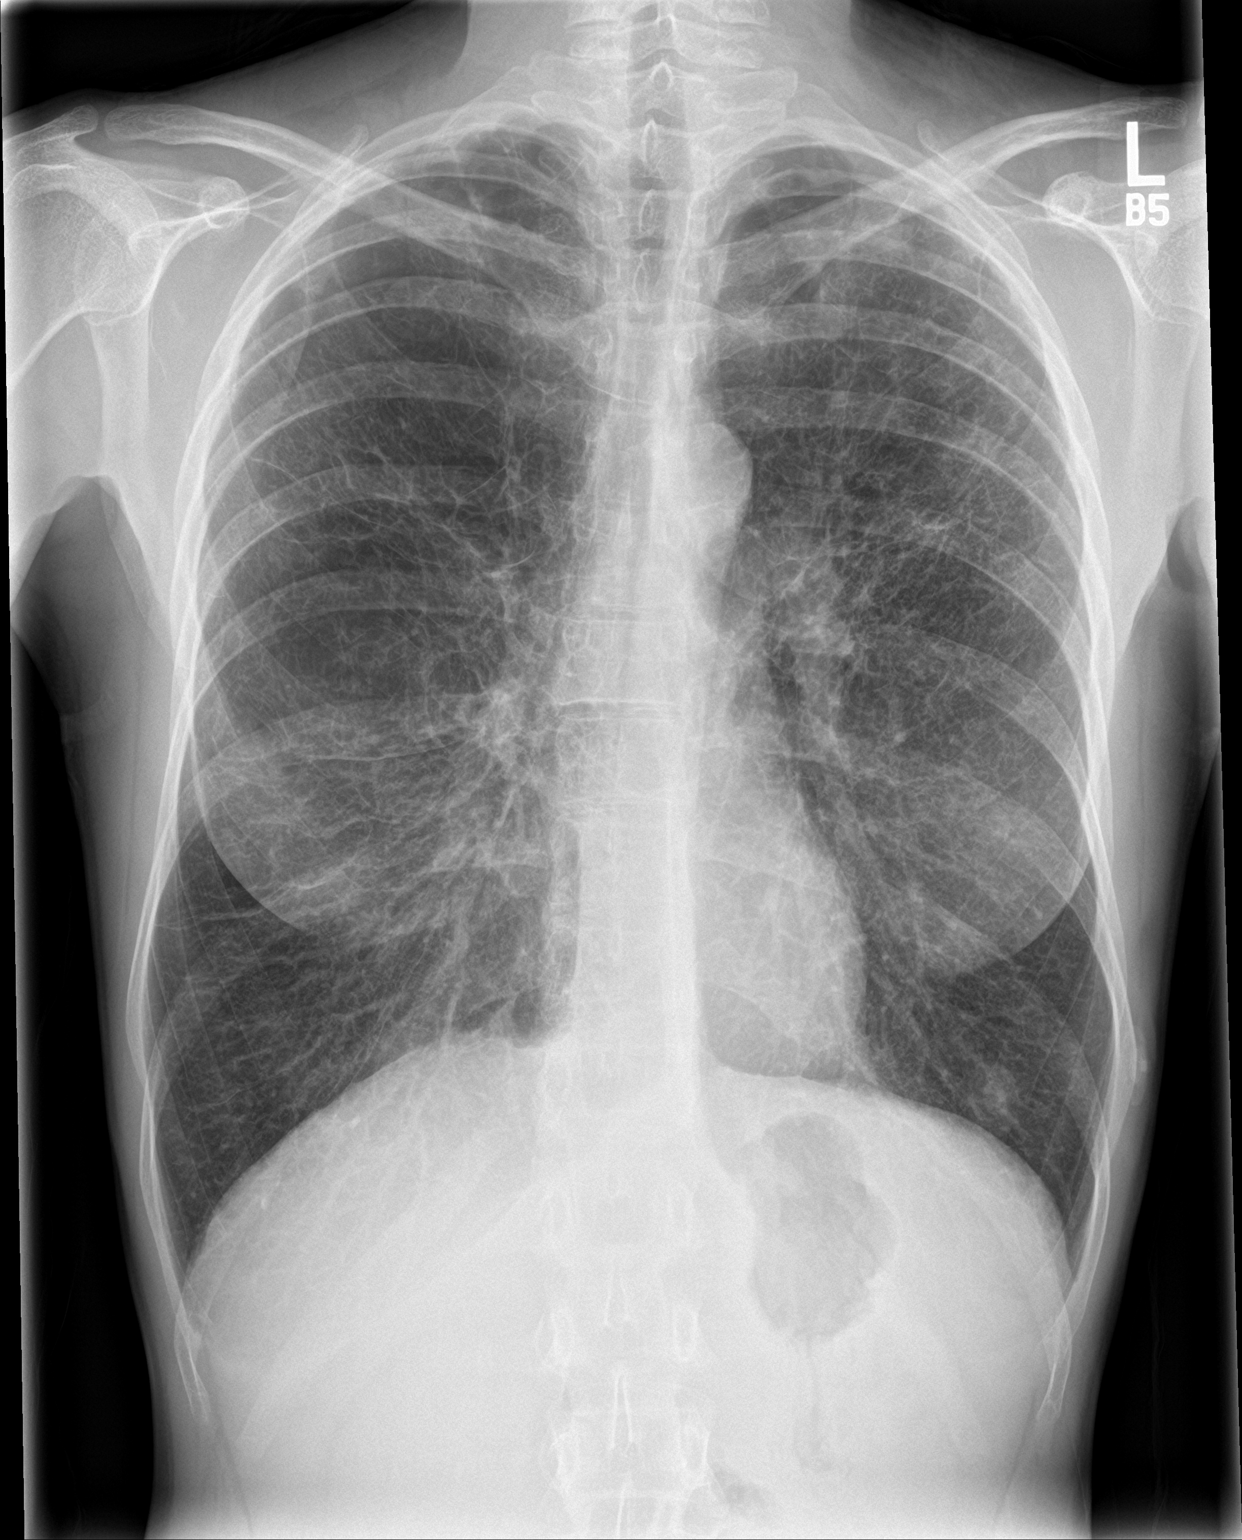

[chest lat]
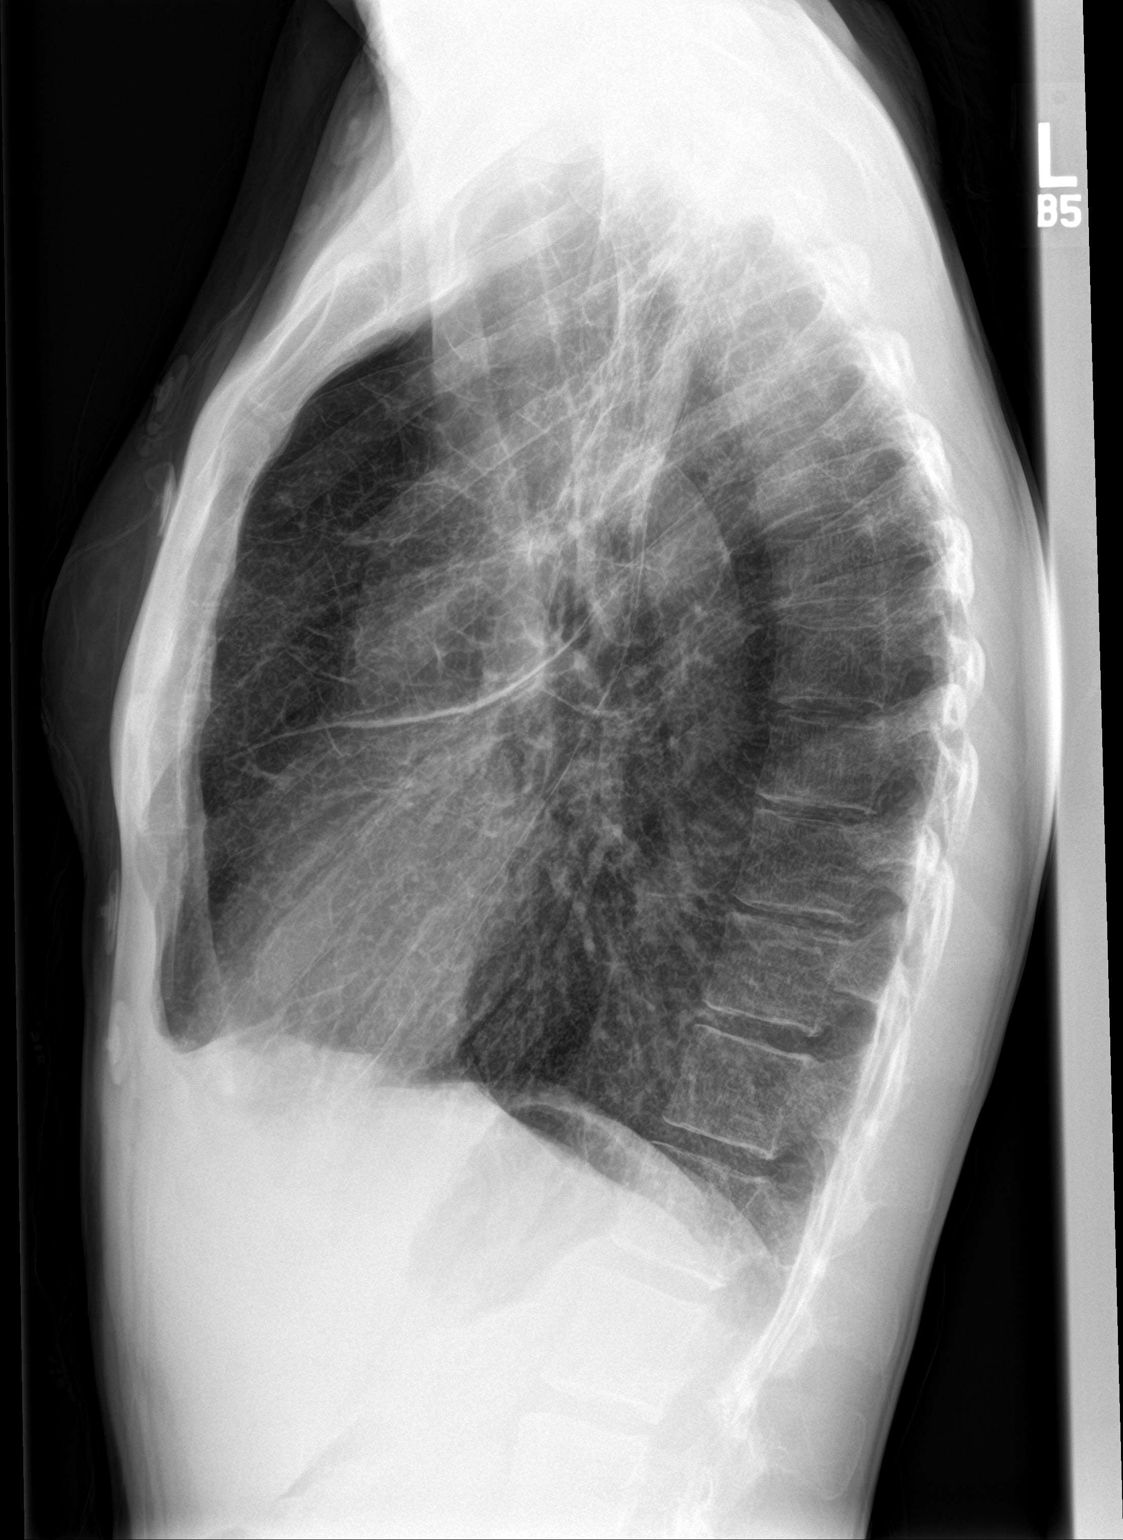

[2 of 2 positions shown; findings below may reference images not displayed]

FINDINGS: Normal heart size, mediastinal contours, and pulmonary vascularity.

Emphysematous, bronchitic and RIGHT upper lobe bullous disease
changes consistent with COPD.

Chronic interstitial thickening throughout both lungs slightly more
prominent than on previous exam question progression of chronic
interstitial lung disease.

EKG leads project over chest.

No definite segmental consolidation, pleural effusion or
pneumothorax.

Bones demineralized.
IMPRESSION: Changes of COPD and progressive chronic interstitial lung disease
since prior exam.

No definite acute infiltrate.

## 2021-10-17 ENCOUNTER — Other Ambulatory Visit: Payer: Self-pay

## 2021-10-17 ENCOUNTER — Emergency Department (HOSPITAL_COMMUNITY)
Admission: EM | Admit: 2021-10-17 | Discharge: 2021-10-17 | Disposition: A | Discharge status: Home / Self Care | Payer: Medicare Other | Attending: Emergency Medicine | Admitting: Emergency Medicine

## 2021-10-17 ENCOUNTER — Emergency Department (HOSPITAL_COMMUNITY): Payer: Medicare Other

## 2021-10-17 ENCOUNTER — Encounter (HOSPITAL_COMMUNITY): Payer: Self-pay | Admitting: Emergency Medicine

## 2021-10-17 DIAGNOSIS — R079 Chest pain, unspecified: Secondary | ICD-10-CM | POA: Diagnosis not present

## 2021-10-17 DIAGNOSIS — R059 Cough, unspecified: Secondary | ICD-10-CM | POA: Insufficient documentation

## 2021-10-17 DIAGNOSIS — G8929 Other chronic pain: Secondary | ICD-10-CM | POA: Insufficient documentation

## 2021-10-17 DIAGNOSIS — J449 Chronic obstructive pulmonary disease, unspecified: Secondary | ICD-10-CM | POA: Insufficient documentation

## 2021-10-17 DIAGNOSIS — R0600 Dyspnea, unspecified: Secondary | ICD-10-CM | POA: Insufficient documentation

## 2021-10-17 DIAGNOSIS — R0602 Shortness of breath: Secondary | ICD-10-CM | POA: Diagnosis present

## 2021-10-17 NOTE — ED Provider Notes (Signed)
?[facility] ?Provider Note ? ? ?CSN: [id number] ?Arrival date & time: 10/17/21  1117 ? ?  ? ?History ? ?Chief Complaint  ?Patient presents with  ? Shortness of Breath  ? ? ?Deanna Klein is a 46 y.o. female. ? ?HPI ?46 year old female presents with left-sided chest pain.  This has been ongoing for since 2016 or so when she had a chest tube placed on the left side of her chest for a pneumothorax.  She states that the pain at times waxes and wanes.  Over the last 24 hours has been more severe than it has been.  No new injuries or illness.  She has a chronic cough and chronic dyspnea from COPD but these are unchanged.  No leg swelling.  She has been told by her lung specialist that is not her lung.  She wants to make sure there is nothing else wrong and wants to figure out why she continues to hurt. ? ?Home Medications ?Prior to Admission medications   ?Medication Sig Start Date End Date Taking? Authorizing Provider  ?divalproex (DEPAKOTE) 500 MG DR tablet Take 500 mg by mouth once. Pt took this from an old rx. Pt was on the rx over a year ago.    [provider]  ?metroNIDAZOLE (FLAGYL) 500 MG tablet Take 1 tablet (500 mg total) by mouth 2 (two) times daily. 09/15/15   Joy, Helane Gunther, PA-C  ?   ? ?Allergies    ?Darvocet [propoxyphene n-acetaminophen] and Percocet [oxycodone-acetaminophen]   ? ?Review of Systems   ?Review of Systems  ?Respiratory:  Positive for cough (chronic) and shortness of breath (chronic).   ?Cardiovascular:  Positive for chest pain.  ? ?Physical Exam ?Updated Vital Signs ?BP (!) 137/99   Pulse 93   Temp 98.2 ?F (36.8 ?C) (Oral)   Resp (!) 29   Ht 5\' 4"  (1.626 m)   Wt 45.4 kg   SpO2 97%   BMI 17.18 kg/m?  ?Physical Exam ?Vitals and nursing note reviewed.  ?Constitutional:   ?   Appearance: She is well-developed.  ?HENT:  ?   Head: Normocephalic and atraumatic.  ?Cardiovascular:  ?   Rate and Rhythm: Normal rate and regular rhythm.  ?   Heart  sounds: Normal heart sounds.  ?Pulmonary:  ?   Effort: Pulmonary effort is normal. No tachypnea or accessory muscle usage.  ?   Breath sounds: Normal breath sounds. No decreased breath sounds, wheezing, rhonchi or rales.  ?Chest:  ? ? ?Abdominal:  ?   Palpations: Abdomen is soft.  ?   Tenderness: There is no abdominal tenderness.  ?Skin: ?   General: Skin is warm and dry.  ?   Findings: No rash.  ?Neurological:  ?   Mental Status: She is alert.  ? ? ?ED Results / Procedures / Treatments   ?Labs ?(all labs ordered are listed, but only abnormal results are displayed) ?Labs Reviewed - No data to display ? ?EKG ?EKG Interpretation ? ?Date/Time:  Thursday October 17 2021 11:23:40 EDT ?Ventricular Rate:  98 ?PR Interval:  114 ?QRS Duration: 64 ?QT Interval:  366 ?QTC Calculation: 467 ?R Axis:   77 ?Text Interpretation: Normal sinus rhythm Biatrial enlargement Nonspecific ST abnormality Abnormal ECG Reconfirmed by Sherwood Gambler [id number]) on 10/17/2021 11:41:40 AM ? ?Radiology ?DG Chest 2 View ? ?Result Date: 10/17/2021 ?CLINICAL DATA:  left chest pain for years EXAM: CHEST - 2 VIEW COMPARISON:  Radiograph 09/09/2017 FINDINGS: Unchanged cardiomediastinal silhouette. Unchanged emphysema  with chronic lung scarring and interstitial prominence. There is no new airspace disease. No large pleural effusion. No pneumothorax. No acute osseous abnormality. IMPRESSION: Unchanged severe emphysema.  No new airspace disease. Electronically Signed   By: Maurine Simmering M.D.   On: 10/17/2021 12:33   ? ?Procedures ?Procedures  ? ? ?Medications Ordered in ED ?Medications - No data to display ? ?ED Course/ Medical Decision Making/ A&P ?  ?                        ?Medical Decision Making ?Amount and/or Complexity of Data Reviewed ?External Data Reviewed: notes. ?Radiology: ordered and independent interpretation performed. ? ? ?Patient is well-appearing.  Chest x-ray shows no pneumonia or pneumothorax on my view.  Agree with radiology.  Her ECG shows  some nonspecific changes though it is fairly similar to baseline.  I discussed that we could check for other things such as MI or PE but she declines and really just only wants the x-ray.  Given the chronicity have pretty low suspicion that this is anything more than chronic chest pain from the previous chest tube insertion.  She appears stable for discharge home. Given return precautions. ? ? ? ? ? ? ? ?Final Clinical Impression(s) / ED Diagnoses ?Final diagnoses:  ?Chronic chest pain  ? ? ?Rx / DC Orders ?ED Discharge Orders   ? ? None  ? ?  ? ? ?  ?Sherwood Gambler, MD ?10/17/21 1548 ? ?

## 2021-10-17 NOTE — Discharge Instructions (Addendum)
If you develop recurrent, continued, or worsening chest pain, shortness of breath, fever, vomiting, abdominal or back pain, or any other new/concerning symptoms then return to the ER for evaluation.  

## 2021-10-17 NOTE — ED Triage Notes (Signed)
Pt here for pain on left side near previous surgical site from lung surgery. Pain increased today. Reports ShOB.  ? ?Hx: COPD ?

## 2022-09-24 ENCOUNTER — Ambulatory Visit (HOSPITAL_COMMUNITY): Payer: Medicare HMO | Admitting: Mental Health

## 2022-12-15 ENCOUNTER — Encounter (HOSPITAL_COMMUNITY): Payer: Self-pay | Admitting: Emergency Medicine

## 2022-12-15 ENCOUNTER — Emergency Department (HOSPITAL_COMMUNITY)
Admission: EM | Admit: 2022-12-15 | Discharge: 2022-12-16 | Disposition: A | Discharge status: Home / Self Care | Payer: Medicare HMO | Attending: Emergency Medicine | Admitting: Emergency Medicine

## 2022-12-15 ENCOUNTER — Other Ambulatory Visit: Payer: Self-pay

## 2022-12-15 DIAGNOSIS — N898 Other specified noninflammatory disorders of vagina: Secondary | ICD-10-CM

## 2022-12-15 DIAGNOSIS — A599 Trichomoniasis, unspecified: Secondary | ICD-10-CM | POA: Diagnosis not present

## 2022-12-15 DIAGNOSIS — J449 Chronic obstructive pulmonary disease, unspecified: Secondary | ICD-10-CM | POA: Insufficient documentation

## 2022-12-15 DIAGNOSIS — Z1152 Encounter for screening for COVID-19: Secondary | ICD-10-CM | POA: Diagnosis not present

## 2022-12-15 LAB — PREGNANCY, URINE: Preg Test, Ur: NEGATIVE

## 2022-12-15 MED ORDER — FLUCONAZOLE 150 MG PO TABS
150.0000 mg | ORAL_TABLET | Freq: Once | ORAL | Status: AC
  Administered 2022-12-16: 150 mg via ORAL
  Filled 2022-12-15: qty 1

## 2022-12-15 MED ORDER — CEFTRIAXONE SODIUM 500 MG IJ SOLR
500.0000 mg | Freq: Once | INTRAMUSCULAR | Status: AC
Start: 2022-12-15 — End: 2022-12-15
  Administered 2022-12-15: 500 mg via INTRAMUSCULAR
  Filled 2022-12-15: qty 500

## 2022-12-15 MED ORDER — LIDOCAINE HCL (PF) 1 % IJ SOLN
INTRAMUSCULAR | Status: AC
  Administered 2022-12-15: 2.1 mL
  Filled 2022-12-15: qty 5

## 2022-12-15 MED ORDER — METRONIDAZOLE 500 MG PO TABS
2000.0000 mg | ORAL_TABLET | Freq: Once | ORAL | Status: AC
  Administered 2022-12-15: 2000 mg via ORAL
  Filled 2022-12-15: qty 4

## 2022-12-15 MED ORDER — DOXYCYCLINE HYCLATE 100 MG PO CAPS: 100.0000 mg | ORAL_CAPSULE | Freq: Two times a day (BID) | ORAL | 0 refills | Status: DC

## 2022-12-15 MED ORDER — AZITHROMYCIN 600 MG PO TABS
1200.0000 mg | ORAL_TABLET | Freq: Once | ORAL | Status: AC
Start: 2022-12-15 — End: 2022-12-16
  Administered 2022-12-16: 1200 mg via ORAL
  Filled 2022-12-15: qty 2

## 2022-12-15 NOTE — ED Provider Triage Note (Signed)
Emergency Medicine Provider Triage Evaluation Note  NOHELY WHITEHORN , a 47 y.o. female  was evaluated in triage.  Pt complains of vaginal itching for the past 2-3 weeks. She is concerned she had an infection. No fevers, abdominal pain, or urinary symptoms. No OTC treatment trialed.   Review of Systems  Positive:  Negative:   Physical Exam  BP (!) 141/83 (BP Location: Left Arm)   Pulse 98   Temp 99.1 F (37.3 C)   Resp 18   SpO2 93%  Gen:   Awake, no distress   Resp:  Normal effort  MSK:   Moves extremities without difficulty  Other:  GU exam deferred in triage setting.   Medical Decision Making  Medically screening exam initiated at 4:01 PM.  Appropriate orders placed.  CAILY RAKERS was informed that the remainder of the evaluation will be completed by another provider, this initial triage assessment does not replace that evaluation, and the importance of remaining in the ED until their evaluation is complete.     Achille Rich, [place] 12/15/22 [phone]

## 2022-12-15 NOTE — ED Triage Notes (Signed)
Pt reports vaginal itching and burning x 1 week.

## 2022-12-15 NOTE — ED Provider Triage Note (Deleted)
Emergency Medicine Provider Triage Evaluation Note  Deanna Klein , a 47 y.o. female  was evaluated in triage.  Pt complains of right upper quadrant and right lower quadrant pain for the past 2 weeks.  Patient reports that she was treated for pyelonephritis recently was given Cipro.  Reports that her pain has improved some however still having pain whenever she urinates.  She reports that her pain happens in her right upper quadrant after she eats.  Denies any diarrhea or constipation..  Review of Systems  Positive:  Negative:   Physical Exam  BP (!) 141/83 (BP Location: Left Arm)   Pulse 98   Temp 99.1 F (37.3 C)   Resp 18   SpO2 93%  Gen:   Awake, no distress   Resp:  Normal effort  MSK:   Moves extremities without difficulty  Other:  Right lower quadrant and right upper quadrant tenderness palpation.  Soft.  No CVA tenderness.  Medical Decision Making  Medically screening exam initiated at 4:11 PM.  Appropriate orders placed.  JEMIMA PETKO was informed that the remainder of the evaluation will be completed by another provider, this initial triage assessment does not replace that evaluation, and the importance of remaining in the ED until their evaluation is complete.  CT abdomen ordered   Achille Rich, PA-C 12/15/22 0865

## 2022-12-15 NOTE — ED Provider Notes (Signed)
  [place] EMERGENCY DEPARTMENT AT [facility] Provider Note   CSN: [id number] Arrival date & time: 12/15/22  1256     History {Add pertinent medical, surgical, social history, OB history to HPI:1} Chief Complaint  Patient presents with   Vaginal Itching    Deanna Klein is a 47 y.o. female.  HPI     Home Medications Prior to Admission medications   Medication Sig Start Date End Date Taking? Authorizing Provider  doxycycline (VIBRAMYCIN) 100 MG capsule Take 1 capsule (100 mg total) by mouth 2 (two) times daily. 12/15/22  Yes Alvira Monday, MD  divalproex (DEPAKOTE) 500 MG DR tablet Take 500 mg by mouth once. Pt took this from an old rx. Pt was on the rx over a year ago.    [provider]  metroNIDAZOLE (FLAGYL) 500 MG tablet Take 1 tablet (500 mg total) by mouth 2 (two) times daily. 09/15/15   Joy, Shawn C, PA-C      Allergies    Darvocet [propoxyphene n-acetaminophen] and Percocet [oxycodone-acetaminophen]    Review of Systems   Review of Systems  Physical Exam Updated Vital Signs BP 139/86   Pulse 70   Temp (!) 97.2 F (36.2 C) (Oral)   Resp 18   SpO2 97%  Physical Exam  ED Results / Procedures / Treatments   Labs (all labs ordered are listed, but only abnormal results are displayed) Labs Reviewed  WET PREP, GENITAL  PREGNANCY, URINE  HIV ANTIBODY (ROUTINE TESTING W REFLEX)  RPR  GC/CHLAMYDIA PROBE AMP ([place]) NOT AT [facility]    EKG None  Radiology No results found.  Procedures Procedures  {Document cardiac monitor, telemetry assessment procedure when appropriate:1}  Medications Ordered in ED Medications  azithromycin (ZITHROMAX) tablet 1,200 mg (has no administration in time range)  cefTRIAXone (ROCEPHIN) injection 500 mg (500 mg Intramuscular Given 12/15/22 2342)  metroNIDAZOLE (FLAGYL) tablet 2,000 mg (2,000 mg Oral Given 12/15/22 2341)  lidocaine (PF) (XYLOCAINE) 1 % injection (2.1 mLs  Given 12/15/22 2342)    ED  Course/ Medical Decision Making/ A&P   {   Click here for ABCD2, HEART and other calculatorsREFRESH Note before signing :1}                          Medical Decision Making Amount and/or Complexity of Data Reviewed Labs: ordered.  Risk Prescription drug management.   ***  {Document critical care time when appropriate:1} {Document review of labs and clinical decision tools ie heart score, Chads2Vasc2 etc:1}  {Document your independent review of radiology images, and any outside records:1} {Document your discussion with family members, caretakers, and with consultants:1} {Document social determinants of health affecting pt's care:1} {Document your decision making why or why not admission, treatments were needed:1} Final Clinical Impression(s) / ED Diagnoses Final diagnoses:  Vaginal discharge    Rx / DC Orders ED Discharge Orders          Ordered    doxycycline (VIBRAMYCIN) 100 MG capsule  2 times daily        12/15/22 2356

## 2022-12-16 ENCOUNTER — Emergency Department (HOSPITAL_COMMUNITY): Payer: Medicare HMO

## 2022-12-16 DIAGNOSIS — A599 Trichomoniasis, unspecified: Secondary | ICD-10-CM | POA: Diagnosis not present

## 2022-12-16 LAB — WET PREP, GENITAL
Clue Cells Wet Prep HPF POC: NONE SEEN
Sperm: NONE SEEN
WBC, Wet Prep HPF POC: >=10 — AB (ref <10)
Yeast Wet Prep HPF POC: NONE SEEN

## 2022-12-16 LAB — HIV ANTIBODY (ROUTINE TESTING W REFLEX): HIV Screen 4th Generation wRfx: NONREACTIVE

## 2022-12-16 LAB — COMPREHENSIVE METABOLIC PANEL
ALT: 11 U/L (ref 0–44)
AST: 19 U/L (ref 15–41)
Albumin: 3 g/dL — ABNORMAL LOW (ref 3.5–5.0)
Alkaline Phosphatase: 58 U/L (ref 38–126)
Anion gap: 9 (ref 5–15)
BUN: 15 mg/dL (ref 6–20)
CO2: 24 mmol/L (ref 22–32)
Calcium: 8.8 mg/dL — ABNORMAL LOW (ref 8.9–10.3)
Chloride: 105 mmol/L (ref 98–111)
Creatinine, Ser: 0.81 mg/dL (ref 0.44–1.00)
GFR, Estimated: >60 mL/min (ref >60)
Glucose, Bld: 142 mg/dL — ABNORMAL HIGH (ref 70–99)
Potassium: 3.4 mmol/L — ABNORMAL LOW (ref 3.5–5.1)
Sodium: 138 mmol/L (ref 135–145)
Total Bilirubin: 0.5 mg/dL (ref 0.3–1.2)
Total Protein: 6.3 g/dL — ABNORMAL LOW (ref 6.5–8.1)

## 2022-12-16 LAB — SARS CORONAVIRUS 2 BY RT PCR: SARS Coronavirus 2 by RT PCR: NEGATIVE

## 2022-12-16 LAB — CBC WITH DIFFERENTIAL/PLATELET
Abs Immature Granulocytes: 0.04 10*3/uL (ref 0.00–0.07)
Basophils Absolute: 0 10*3/uL (ref 0.0–0.1)
Basophils Relative: 0 %
Eosinophils Absolute: 0.2 10*3/uL (ref 0.0–0.5)
Eosinophils Relative: 2 %
HCT: 43.5 % (ref 36.0–46.0)
Hemoglobin: 14.6 g/dL (ref 12.0–15.0)
Immature Granulocytes: 0 %
Lymphocytes Relative: 14 %
Lymphs Abs: 1.8 10*3/uL (ref 0.7–4.0)
MCH: 30.7 pg (ref 26.0–34.0)
MCHC: 33.6 g/dL (ref 30.0–36.0)
MCV: 91.4 fL (ref 80.0–100.0)
Monocytes Absolute: 1.2 10*3/uL — ABNORMAL HIGH (ref 0.1–1.0)
Monocytes Relative: 9 %
Neutro Abs: 9.5 10*3/uL — ABNORMAL HIGH (ref 1.7–7.7)
Neutrophils Relative %: 75 %
Platelets: 285 10*3/uL (ref 150–400)
RBC: 4.76 MIL/uL (ref 3.87–5.11)
RDW: 13.2 % (ref 11.5–15.5)
WBC: 12.8 10*3/uL — ABNORMAL HIGH (ref 4.0–10.5)
nRBC: 0 % (ref 0.0–0.2)

## 2022-12-16 LAB — RPR: RPR Ser Ql: NONREACTIVE

## 2022-12-16 MED ORDER — METRONIDAZOLE 500 MG PO TABS: 500.0000 mg | ORAL_TABLET | Freq: Two times a day (BID) | ORAL | 0 refills | Status: AC

## 2022-12-16 MED ORDER — LACTATED RINGERS IV BOLUS
1000.0000 mL | Freq: Once | INTRAVENOUS | Status: AC
  Administered 2022-12-16: 1000 mL via INTRAVENOUS

## 2022-12-16 NOTE — ED Notes (Signed)
Pulse Ox while walking started at 89 and moved up to 97 throughout the walk.  MD notified

## 2022-12-16 NOTE — ED Provider Notes (Signed)
Patient was signed out pending ongoing workup.  Significant smoking history.  Noted to desat with sleeping.  Also now complains of generalized weakness.    Labs notable for negative COVID.  No pneumonia on chest x-ray.  Slight leukocytosis.  CBC and CMP are otherwise fairly reassuring.  She is not requiring any oxygen.  She ambulated and maintain her pulse ox.  Suspect intermittent hypoxia is related to smoking and COPD.  Will plan for discharge per Dr. Burr Medico discharge instructions.   Shon Baton, MD 12/16/22 [phone]

## 2022-12-17 LAB — GC/CHLAMYDIA PROBE AMP (~~LOC~~) NOT AT ARMC
Chlamydia: NEGATIVE
Comment: NEGATIVE
Comment: NORMAL
Neisseria Gonorrhea: NEGATIVE

## 2023-01-01 ENCOUNTER — Emergency Department (HOSPITAL_COMMUNITY): Payer: Medicare HMO

## 2023-01-01 ENCOUNTER — Other Ambulatory Visit: Payer: Self-pay

## 2023-01-01 ENCOUNTER — Emergency Department (HOSPITAL_COMMUNITY)
Admission: EM | Admit: 2023-01-01 | Discharge: 2023-01-01 | Disposition: A | Discharge status: Home / Self Care | Payer: Medicare HMO | Attending: Emergency Medicine | Admitting: Emergency Medicine

## 2023-01-01 ENCOUNTER — Encounter (HOSPITAL_COMMUNITY): Payer: Self-pay | Admitting: Emergency Medicine

## 2023-01-01 DIAGNOSIS — Z7951 Long term (current) use of inhaled steroids: Secondary | ICD-10-CM | POA: Diagnosis not present

## 2023-01-01 DIAGNOSIS — Z20822 Contact with and (suspected) exposure to covid-19: Secondary | ICD-10-CM | POA: Insufficient documentation

## 2023-01-01 DIAGNOSIS — Z1152 Encounter for screening for COVID-19: Secondary | ICD-10-CM | POA: Insufficient documentation

## 2023-01-01 DIAGNOSIS — J40 Bronchitis, not specified as acute or chronic: Secondary | ICD-10-CM | POA: Diagnosis not present

## 2023-01-01 DIAGNOSIS — F17210 Nicotine dependence, cigarettes, uncomplicated: Secondary | ICD-10-CM | POA: Diagnosis not present

## 2023-01-01 DIAGNOSIS — R059 Cough, unspecified: Secondary | ICD-10-CM | POA: Diagnosis present

## 2023-01-01 DIAGNOSIS — J449 Chronic obstructive pulmonary disease, unspecified: Secondary | ICD-10-CM | POA: Insufficient documentation

## 2023-01-01 LAB — RESP PANEL BY RT-PCR (RSV, FLU A&B, COVID)  RVPGX2
Influenza A by PCR: NEGATIVE
Influenza B by PCR: NEGATIVE
Resp Syncytial Virus by PCR: NEGATIVE
SARS Coronavirus 2 by RT PCR: NEGATIVE

## 2023-01-01 MED ORDER — AMOXICILLIN-POT CLAVULANATE 875-125 MG PO TABS: 1.0000 | ORAL_TABLET | Freq: Two times a day (BID) | ORAL | 0 refills | Status: DC

## 2023-01-01 MED ORDER — ALBUTEROL SULFATE HFA 108 (90 BASE) MCG/ACT IN AERS: 1.0000 | INHALATION_SPRAY | Freq: Four times a day (QID) | RESPIRATORY_TRACT | 2 refills | Status: DC | PRN

## 2023-01-01 NOTE — ED Provider Notes (Signed)
[place] EMERGENCY DEPARTMENT AT [facility] Provider Note  CSN: [id number] Arrival date & time: 01/01/23 1342  Chief Complaint(s) No chief complaint on file.  HPI IZZABELL Klein is a 47 y.o. female history of COPD, polysubstance abuse presents to the emergency department with cough.  Patient reports 2 days of cough, associated congestion.  No sick contacts.  No shortness of breath.  Has not been using any of her inhalers.  No chest pain, abdominal pain, nausea, vomiting, diarrhea.  Symptoms are mild.  Also reports some subjective fevers and chills.   Past Medical History Past Medical History:  Diagnosis Date   Cocaine abuse (HCC)    Depression    Seizures (HCC)    STD (female)    Substance abuse (HCC)    crack cocaine   Patient Active Problem List   Diagnosis Date Noted   Necrotizing pneumonia (HCC) 02/02/2014   Lung abscess (HCC) 02/02/2014   Solitary pulmonary nodule 02/02/2014   COPD (chronic obstructive pulmonary disease) (HCC) 02/02/2014   Seizure disorder (HCC) 02/02/2014   Depression 02/02/2014   Polysubstance abuse (HCC) 02/02/2014   Normocytic anemia 02/02/2014   Protein-calorie malnutrition, severe (HCC) 03/07/2013   Cocaine abuse (HCC) 03/06/2013   Smoking 03/06/2013   Ovarian cyst 05/30/2011   Home Medication(s) Prior to Admission medications   Medication Sig Start Date End Date Taking? Authorizing Provider  albuterol (VENTOLIN HFA) 108 (90 Base) MCG/ACT inhaler Inhale 1-2 puffs into the lungs every 6 (six) hours as needed for wheezing or shortness of breath. 01/01/23  Yes Lonell Grandchild, MD  amoxicillin-clavulanate (AUGMENTIN) 875-125 MG tablet Take 1 tablet by mouth every 12 (twelve) hours. 01/01/23  Yes Lonell Grandchild, MD  divalproex (DEPAKOTE) 500 MG DR tablet Take 500 mg by mouth once. Pt took this from an old rx. Pt was on the rx over a year ago.    [provider]                                                                                                                                     Past Surgical History Past Surgical History:  Procedure Laterality Date   right lung collapse     Family History History reviewed. No pertinent family history.  Social History Social History   Tobacco Use   Smoking status: Every Day    Current packs/day: 1.00    Average packs/day: 1 pack/day for 19.0 years (19.0 ttl pk-yrs)    Types: Cigarettes   Smokeless tobacco: Never  Substance Use Topics   Alcohol use: No   Drug use: Yes    Types: "Crack" cocaine, Cocaine    Comment: crack-couple months sober   Allergies Darvocet [propoxyphene n-acetaminophen] and Percocet [oxycodone-acetaminophen]  Review of Systems Review of Systems  All other systems reviewed and are negative.   Physical Exam Vital Signs  I have reviewed the triage vital signs BP 135/84  Pulse (!) 101   Temp 99.1 F (37.3 C) (Oral)   Resp 18   Ht 5\' 4"  (1.626 m)   Wt 45.4 kg   SpO2 94%   BMI 17.16 kg/m  Physical Exam Vitals and nursing note reviewed.  Constitutional:      General: She is not in acute distress.    Appearance: She is well-developed.  HENT:     Head: Normocephalic and atraumatic.     Nose: Congestion and rhinorrhea present.     Mouth/Throat:     Mouth: Mucous membranes are moist.  Eyes:     Pupils: Pupils are equal, round, and reactive to light.  Cardiovascular:     Rate and Rhythm: Normal rate and regular rhythm.     Heart sounds: No murmur heard. Pulmonary:     Effort: Pulmonary effort is normal. No respiratory distress.     Breath sounds: Normal breath sounds.  Abdominal:     General: Abdomen is flat.     Palpations: Abdomen is soft.     Tenderness: There is no abdominal tenderness.  Musculoskeletal:        General: No tenderness.     Right lower leg: No edema.     Left lower leg: No edema.  Skin:    General: Skin is warm and dry.  Neurological:     General: No focal deficit present.     Mental  Status: She is alert. Mental status is at baseline.  Psychiatric:        Mood and Affect: Mood normal.        Behavior: Behavior normal.     ED Results and Treatments Labs (all labs ordered are listed, but only abnormal results are displayed) Labs Reviewed  RESP PANEL BY RT-PCR (RSV, FLU A&B, COVID)  RVPGX2                                                                                                                          Radiology DG Chest 2 View  Result Date: 01/01/2023 CLINICAL DATA:  cough, fever EXAM: CHEST - 2 VIEW COMPARISON:  12/16/2022. FINDINGS: Bilateral lungs appear hyperexpanded and hyperlucent with coarse bronchovascular markings, in keeping with COPD. Bilateral lungs otherwise appear clear. No superimposed consolidation or major atelectasis. Bilateral costophrenic angles are clear. Normal cardio-mediastinal silhouette. No acute osseous abnormalities. The soft tissues are within normal limits. IMPRESSION: 1. COPD. No active cardiopulmonary disease. Electronically Signed   By: Jules Schick M.D.   On: 01/01/2023 15:04    Pertinent labs & imaging results that were available during my care of the patient were reviewed by me and considered in my medical decision making (see MDM for details).  Medications Ordered in ED Medications - No data to display  Procedures Procedures  (including critical care time)  Medical Decision Making / ED Course   MDM:  47 year old female presenting with cough.  Patient overall well-appearing, physical exam with clear lungs.  Vitals with borderline tachycardia although not tachycardic on my exam.  She has frequent cough.  Suspect viral URI with possible bronchitis.  Given patient does have COPD will cover with Augmentin .  COVID testing negative.  Chest x-ray without evidence of pneumonia or pneumothorax.   Advised that she should follow-up with her pulmonary doctor.  Sent refill of her inhaler although she has no wheezing on exam. Will discharge patient to home. All questions answered. Patient comfortable with plan of discharge. Return precautions discussed with patient and specified on the after visit summary.       Additional history obtained: -Additional history obtained from friend -External records from outside source obtained and reviewed including: Chart review including previous notes, labs, imaging, consultation notes including previous ER visit for trichomonas   Lab Tests: -I ordered, reviewed, and interpreted labs.   The pertinent results include:   Labs Reviewed  RESP PANEL BY RT-PCR (RSV, FLU A&B, COVID)  RVPGX2    Notable for negative COVID  Imaging Studies ordered: I ordered imaging studies including CXR On my interpretation imaging demonstrates no acute process I independently visualized and interpreted imaging. I agree with the radiologist interpretation   Medicines ordered and prescription drug management: Meds ordered this encounter  Medications   amoxicillin-clavulanate (AUGMENTIN) 875-125 MG tablet    Sig: Take 1 tablet by mouth every 12 (twelve) hours.    Dispense:  14 tablet    Refill:  0   albuterol (VENTOLIN HFA) 108 (90 Base) MCG/ACT inhaler    Sig: Inhale 1-2 puffs into the lungs every 6 (six) hours as needed for wheezing or shortness of breath.    Dispense:  18 g    Refill:  2    -I have reviewed the patients home medicines and have made adjustments as needed   Social Determinants of Health:  Diagnosis or treatment significantly limited by social determinants of health: current smoker and polysubstance abuse   Reevaluation: After the interventions noted above, I reevaluated the patient and found that their symptoms have improved  Co morbidities that complicate the patient evaluation  Past Medical History:  Diagnosis Date   Cocaine abuse  (HCC)    Depression    Seizures (HCC)    STD (female)    Substance abuse (HCC)    crack cocaine      Dispostion: Disposition decision including need for hospitalization was considered, and patient discharged from emergency department.    Final Clinical Impression(s) / ED Diagnoses Final diagnoses:  Bronchitis     This chart was dictated using voice recognition software.  Despite best efforts to proofread,  errors can occur which can change the documentation meaning.    Lonell Grandchild, MD 01/01/23 [phone]

## 2023-01-01 NOTE — ED Triage Notes (Signed)
Patient c/o cold chills and cough x 2 days.

## 2023-01-01 NOTE — Discharge Instructions (Addendum)
We evaluated you for your cough.  Your symptoms are likely due to bronchitis.  Your chest x-ray did not show any pneumonia.  We have sent you an antibiotic since you have COPD.  I have also refilled your inhaler.  Please follow-up with your pulmonary doctor.  If you have any new or worsening symptoms, such as increased trouble breathing, fainting, dizziness, lightheadedness, high fevers, chest pain, or any other new symptoms please return to the emergency department for reassessment.

## 2023-01-28 ENCOUNTER — Encounter (HOSPITAL_COMMUNITY): Payer: Self-pay | Admitting: Internal Medicine

## 2023-01-28 ENCOUNTER — Emergency Department (HOSPITAL_COMMUNITY): Payer: Medicare HMO

## 2023-01-28 ENCOUNTER — Other Ambulatory Visit: Payer: Self-pay

## 2023-01-28 ENCOUNTER — Observation Stay (HOSPITAL_COMMUNITY)
Admission: EM | Admit: 2023-01-28 | Discharge: 2023-01-29 | Disposition: A | Discharge status: Home / Self Care | Payer: Medicare HMO | Attending: Internal Medicine | Admitting: Internal Medicine

## 2023-01-28 DIAGNOSIS — Z1152 Encounter for screening for COVID-19: Secondary | ICD-10-CM | POA: Diagnosis not present

## 2023-01-28 DIAGNOSIS — R0602 Shortness of breath: Secondary | ICD-10-CM | POA: Diagnosis present

## 2023-01-28 DIAGNOSIS — N390 Urinary tract infection, site not specified: Secondary | ICD-10-CM | POA: Diagnosis not present

## 2023-01-28 DIAGNOSIS — J441 Chronic obstructive pulmonary disease with (acute) exacerbation: Secondary | ICD-10-CM | POA: Diagnosis not present

## 2023-01-28 DIAGNOSIS — E876 Hypokalemia: Secondary | ICD-10-CM | POA: Diagnosis not present

## 2023-01-28 DIAGNOSIS — A419 Sepsis, unspecified organism: Secondary | ICD-10-CM | POA: Diagnosis not present

## 2023-01-28 DIAGNOSIS — J9601 Acute respiratory failure with hypoxia: Secondary | ICD-10-CM | POA: Diagnosis not present

## 2023-01-28 DIAGNOSIS — R6510 Systemic inflammatory response syndrome (SIRS) of non-infectious origin without acute organ dysfunction: Secondary | ICD-10-CM | POA: Diagnosis not present

## 2023-01-28 DIAGNOSIS — F17210 Nicotine dependence, cigarettes, uncomplicated: Secondary | ICD-10-CM

## 2023-01-28 LAB — COMPREHENSIVE METABOLIC PANEL
ALT: 12 U/L (ref 0–44)
AST: 16 U/L (ref 15–41)
Albumin: 3.1 g/dL — ABNORMAL LOW (ref 3.5–5.0)
Alkaline Phosphatase: 47 U/L (ref 38–126)
Anion gap: 14 (ref 5–15)
BUN: 27 mg/dL — ABNORMAL HIGH (ref 6–20)
CO2: 23 mmol/L (ref 22–32)
Calcium: 9 mg/dL (ref 8.9–10.3)
Chloride: 98 mmol/L (ref 98–111)
Creatinine, Ser: 1.17 mg/dL — ABNORMAL HIGH (ref 0.44–1.00)
GFR, Estimated: 57 mL/min — ABNORMAL LOW (ref >60)
Glucose, Bld: 104 mg/dL — ABNORMAL HIGH (ref 70–99)
Potassium: 2.8 mmol/L — ABNORMAL LOW (ref 3.5–5.1)
Sodium: 135 mmol/L (ref 135–145)
Total Bilirubin: 0.3 mg/dL (ref 0.3–1.2)
Total Protein: 6.7 g/dL (ref 6.5–8.1)

## 2023-01-28 LAB — CBC WITH DIFFERENTIAL/PLATELET
Abs Immature Granulocytes: 0.07 10*3/uL (ref 0.00–0.07)
Basophils Absolute: 0 10*3/uL (ref 0.0–0.1)
Basophils Relative: 0 %
Eosinophils Absolute: 0 10*3/uL (ref 0.0–0.5)
Eosinophils Relative: 0 %
HCT: 42.1 % (ref 36.0–46.0)
Hemoglobin: 14.2 g/dL (ref 12.0–15.0)
Immature Granulocytes: 1 %
Lymphocytes Relative: 11 %
Lymphs Abs: 1.4 10*3/uL (ref 0.7–4.0)
MCH: 29.3 pg (ref 26.0–34.0)
MCHC: 33.7 g/dL (ref 30.0–36.0)
MCV: 87 fL (ref 80.0–100.0)
Monocytes Absolute: 1.8 10*3/uL — ABNORMAL HIGH (ref 0.1–1.0)
Monocytes Relative: 13 %
Neutro Abs: 10.4 10*3/uL — ABNORMAL HIGH (ref 1.7–7.7)
Neutrophils Relative %: 75 %
Platelets: 170 10*3/uL (ref 150–400)
RBC: 4.84 MIL/uL (ref 3.87–5.11)
RDW: 13.7 % (ref 11.5–15.5)
WBC: 13.7 10*3/uL — ABNORMAL HIGH (ref 4.0–10.5)
nRBC: 0 % (ref 0.0–0.2)

## 2023-01-28 LAB — SARS CORONAVIRUS 2 BY RT PCR: SARS Coronavirus 2 by RT PCR: NEGATIVE

## 2023-01-28 LAB — MAGNESIUM: Magnesium: 2 mg/dL (ref 1.7–2.4)

## 2023-01-28 LAB — BASIC METABOLIC PANEL
Anion gap: 8 (ref 5–15)
BUN: 17 mg/dL (ref 6–20)
CO2: 23 mmol/L (ref 22–32)
Calcium: 8.5 mg/dL — ABNORMAL LOW (ref 8.9–10.3)
Chloride: 103 mmol/L (ref 98–111)
Creatinine, Ser: 0.95 mg/dL (ref 0.44–1.00)
GFR, Estimated: >60 mL/min (ref >60)
Glucose, Bld: 124 mg/dL — ABNORMAL HIGH (ref 70–99)
Potassium: 4.3 mmol/L (ref 3.5–5.1)
Sodium: 134 mmol/L — ABNORMAL LOW (ref 135–145)

## 2023-01-28 LAB — BLOOD GAS, VENOUS
Acid-Base Excess: 1.7 mmol/L (ref 0.0–2.0)
Bicarbonate: 25.8 mmol/L (ref 20.0–28.0)
Drawn by: 7899
O2 Saturation: 81.6 %
Patient temperature: 37
pCO2, Ven: 38 mmHg — ABNORMAL LOW (ref 44–60)
pH, Ven: 7.44 — ABNORMAL HIGH (ref 7.25–7.43)
pO2, Ven: 47 mmHg — ABNORMAL HIGH (ref 32–45)

## 2023-01-28 LAB — URINALYSIS, W/ REFLEX TO CULTURE (INFECTION SUSPECTED)
Bilirubin Urine: NEGATIVE
Glucose, UA: NEGATIVE mg/dL
Ketones, ur: NEGATIVE mg/dL
Nitrite: POSITIVE — AB
Protein, ur: 100 mg/dL — AB
Specific Gravity, Urine: 1.013 (ref 1.005–1.030)
WBC, UA: >50 WBC/hpf (ref 0–5)
pH: 5 (ref 5.0–8.0)

## 2023-01-28 LAB — PROTIME-INR
INR: 1.1 (ref 0.8–1.2)
Prothrombin Time: 14.2 seconds (ref 11.4–15.2)

## 2023-01-28 LAB — RESP PANEL BY RT-PCR (RSV, FLU A&B, COVID)  RVPGX2
Influenza A by PCR: NEGATIVE
Influenza B by PCR: NEGATIVE
Resp Syncytial Virus by PCR: NEGATIVE
SARS Coronavirus 2 by RT PCR: NEGATIVE

## 2023-01-28 LAB — HCG, SERUM, QUALITATIVE: Preg, Serum: NEGATIVE

## 2023-01-28 LAB — I-STAT CG4 LACTIC ACID, ED: Lactic Acid, Venous: 1.1 mmol/L (ref 0.5–1.9)

## 2023-01-28 LAB — APTT: aPTT: 33 seconds (ref 24–36)

## 2023-01-28 MED ORDER — SODIUM CHLORIDE 0.9 % IV SOLN
2.0000 g | INTRAVENOUS | Status: DC
  Administered 2023-01-29: 2 g via INTRAVENOUS
  Filled 2023-01-28: qty 20

## 2023-01-28 MED ORDER — RIVAROXABAN 10 MG PO TABS: 10.0000 mg | ORAL_TABLET | Freq: Every day | ORAL | Status: DC

## 2023-01-28 MED ORDER — PREDNISONE 20 MG PO TABS
40.0000 mg | ORAL_TABLET | Freq: Every day | ORAL | Status: DC
  Administered 2023-01-28 – 2023-01-29 (×2): 40 mg via ORAL
  Filled 2023-01-28 (×2): qty 2

## 2023-01-28 MED ORDER — ACETAMINOPHEN 325 MG PO TABS
650.0000 mg | ORAL_TABLET | Freq: Four times a day (QID) | ORAL | Status: DC | PRN
  Administered 2023-01-28: 650 mg via ORAL
  Filled 2023-01-28: qty 2

## 2023-01-28 MED ORDER — RIVAROXABAN 10 MG PO TABS
10.0000 mg | ORAL_TABLET | Freq: Every day | ORAL | Status: DC
  Administered 2023-01-28 – 2023-01-29 (×2): 10 mg via ORAL
  Filled 2023-01-28 (×2): qty 1

## 2023-01-28 MED ORDER — LACTATED RINGERS IV SOLN: INTRAVENOUS | Status: AC

## 2023-01-28 MED ORDER — IPRATROPIUM-ALBUTEROL 0.5-2.5 (3) MG/3ML IN SOLN
3.0000 mL | Freq: Once | RESPIRATORY_TRACT | Status: AC
  Administered 2023-01-28: 3 mL via RESPIRATORY_TRACT
  Filled 2023-01-28: qty 3

## 2023-01-28 MED ORDER — LACTATED RINGERS IV BOLUS (SEPSIS)
500.0000 mL | Freq: Once | INTRAVENOUS | Status: AC
  Administered 2023-01-28: 500 mL via INTRAVENOUS

## 2023-01-28 MED ORDER — IPRATROPIUM BROMIDE HFA 17 MCG/ACT IN AERS
2.0000 | INHALATION_SPRAY | RESPIRATORY_TRACT | Status: AC
  Administered 2023-01-28 (×2): 2 via RESPIRATORY_TRACT
  Filled 2023-01-28: qty 12.9

## 2023-01-28 MED ORDER — POTASSIUM CHLORIDE 10 MEQ/100ML IV SOLN
10.0000 meq | INTRAVENOUS | Status: AC
  Administered 2023-01-28 (×3): 10 meq via INTRAVENOUS
  Filled 2023-01-28 (×3): qty 100

## 2023-01-28 MED ORDER — SODIUM CHLORIDE 0.9 % IV SOLN
500.0000 mg | INTRAVENOUS | Status: DC
  Administered 2023-01-28: 500 mg via INTRAVENOUS
  Filled 2023-01-28: qty 5

## 2023-01-28 MED ORDER — AZITHROMYCIN 500 MG PO TABS: 500.0000 mg | ORAL_TABLET | Freq: Every day | ORAL | Status: DC

## 2023-01-28 MED ORDER — SODIUM CHLORIDE 0.9 % IV SOLN
2.0000 g | INTRAVENOUS | Status: DC
  Administered 2023-01-28: 2 g via INTRAVENOUS
  Filled 2023-01-28: qty 20

## 2023-01-28 MED ORDER — POTASSIUM CHLORIDE CRYS ER 20 MEQ PO TBCR
40.0000 meq | EXTENDED_RELEASE_TABLET | Freq: Once | ORAL | Status: AC
  Administered 2023-01-28: 40 meq via ORAL
  Filled 2023-01-28: qty 2

## 2023-01-28 MED ORDER — AZITHROMYCIN 500 MG PO TABS
500.0000 mg | ORAL_TABLET | Freq: Every day | ORAL | Status: DC
  Administered 2023-01-29: 500 mg via ORAL
  Filled 2023-01-28: qty 1

## 2023-01-28 MED ORDER — LACTATED RINGERS IV BOLUS (SEPSIS)
1000.0000 mL | Freq: Once | INTRAVENOUS | Status: AC
  Administered 2023-01-28: 1000 mL via INTRAVENOUS

## 2023-01-28 NOTE — ED Provider Notes (Signed)
[place] EMERGENCY DEPARTMENT AT [facility] Provider Note   CSN: [id number] Arrival date & time: 01/28/23  8295     History  Chief Complaint  Patient presents with   URI    Deanna Klein is a 47 y.o. female.  The history is provided by the patient, a parent and medical records. No language interpreter was used.  URI    47 year old female has not any history of polysubstance abuse, depression, recurrent lung infection, COPD, presenting with cold symptoms.  History of through patient and through mother who is at bedside. For the past 3 to 4 days patient has had subjective fever, chills, body aches, fatigue, nonproductive cough, mild increase shortness of breath and decreasing appetite.  She tries taking aspirin at home without adequate relief.  She does not endorse nausea vomiting or diarrhea no urinary symptoms no skin rash.  She admits to drug use including crack.  Denies alcohol use.  Had states that she occasionally has no shortness of breath because she does not have her rescue inhaler.  Denies history of IV drug use.  Home Medications Prior to Admission medications   Medication Sig Start Date End Date Taking? Authorizing Provider  albuterol (VENTOLIN HFA) 108 (90 Base) MCG/ACT inhaler Inhale 1-2 puffs into the lungs every 6 (six) hours as needed for wheezing or shortness of breath. 01/01/23   Lonell Grandchild, MD  amoxicillin-clavulanate (AUGMENTIN) 875-125 MG tablet Take 1 tablet by mouth every 12 (twelve) hours. 01/01/23   Lonell Grandchild, MD  divalproex (DEPAKOTE) 500 MG DR tablet Take 500 mg by mouth once. Pt took this from an old rx. Pt was on the rx over a year ago.    [provider]      Allergies    Darvocet [propoxyphene n-acetaminophen] and Percocet [oxycodone-acetaminophen]    Review of Systems   Review of Systems  All other systems reviewed and are negative.   Physical Exam Updated Vital Signs BP (!) 91/59   Pulse (!) 102   Temp  98.3 F (36.8 C) (Oral)   Resp (!) 21   SpO2 92%  Physical Exam Vitals and nursing note reviewed.  Constitutional:      General: She is not in acute distress.    Appearance: She is well-developed.     Comments: Chronically ill-appearing  HENT:     Head: Atraumatic.     Mouth/Throat:     Mouth: Mucous membranes are moist.     Comments: Mostly edentulous Eyes:     Conjunctiva/sclera: Conjunctivae normal.  Cardiovascular:     Rate and Rhythm: Tachycardia present.     Pulses: Normal pulses.     Heart sounds: Normal heart sounds.  Pulmonary:     Effort: Pulmonary effort is normal.     Comments: Decreased breath sounds with occasional expiratory wheezes Abdominal:     Palpations: Abdomen is soft.  Musculoskeletal:     Cervical back: Normal range of motion and neck supple. No rigidity.     Right lower leg: No edema.     Left lower leg: No edema.  Skin:    Findings: No rash.  Neurological:     Mental Status: She is alert.  Psychiatric:        Mood and Affect: Mood normal.     ED Results / Procedures / Treatments   Labs (all labs ordered are listed, but only abnormal results are displayed) Labs Reviewed  COMPREHENSIVE METABOLIC PANEL - Abnormal; Notable for the  following components:      Result Value   Potassium 2.8 (*)    Glucose, Bld 104 (*)    BUN 27 (*)    Creatinine, Ser 1.17 (*)    Albumin 3.1 (*)    GFR, Estimated 57 (*)    All other components within normal limits  CBC WITH DIFFERENTIAL/PLATELET - Abnormal; Notable for the following components:   WBC 13.7 (*)    Neutro Abs 10.4 (*)    Monocytes Absolute 1.8 (*)    All other components within normal limits  URINALYSIS, W/ REFLEX TO CULTURE (INFECTION SUSPECTED) - Abnormal; Notable for the following components:   APPearance TURBID (*)    Hgb urine dipstick MODERATE (*)    Protein, ur 100 (*)    Nitrite POSITIVE (*)    Leukocytes,Ua LARGE (*)    Bacteria, UA MANY (*)    All other components within normal  limits  SARS CORONAVIRUS 2 BY RT PCR  RESP PANEL BY RT-PCR (RSV, FLU A&B, COVID)  [id number]  CULTURE, BLOOD (ROUTINE X 2)  CULTURE, BLOOD (ROUTINE X 2)  URINE CULTURE  PROTIME-INR  APTT  HCG, SERUM, QUALITATIVE  MAGNESIUM  I-STAT CG4 LACTIC ACID, ED    EKG EKG Interpretation Date/Time:  Wednesday January 28 2023 07:54:32 EDT Ventricular Rate:  99 PR Interval:  118 QRS Duration:  73 QT Interval:  337 QTC Calculation: 433 R Axis:   86  Text Interpretation: Sinus rhythm Consider right atrial enlargement Minimal ST depression, inferior leads Confirmed by Alvester Chou [phone]) on 01/28/2023 7:57:13 AM  Radiology DG Chest Port 1 View  Result Date: 01/28/2023 CLINICAL DATA:  Questionable sepsis - evaluate for abnormality EXAM: PORTABLE CHEST 1 VIEW COMPARISON:  01/01/2023. FINDINGS: Bilateral lungs appear hyperexpanded and hyperlucent with coarse bronchovascular markings, in keeping with COPD. There are bullous changes, asymmetrically involving the right upper lobe. Bilateral lungs otherwise appear clear. No dense consolidation. Bilateral costophrenic angles are clear. Normal cardio-mediastinal silhouette. No acute osseous abnormalities. The soft tissues are within normal limits. IMPRESSION: 1. COPD. 2. No acute cardiothoracic abnormality. Electronically Signed   By: Jules Schick M.D.   On: 01/28/2023 09:01    Procedures .Critical Care  Performed by: Fayrene Helper, PA-C Authorized by: Fayrene Helper, PA-C   Critical care provider statement:    Critical care time (minutes):  30   Critical care was time spent personally by me on the following activities:  Development of treatment plan with patient or surrogate, discussions with consultants, evaluation of patient's response to treatment, examination of patient, ordering and review of laboratory studies, ordering and review of radiographic studies, ordering and performing treatments and interventions, pulse oximetry, re-evaluation of patient's  condition and review of old charts     Medications Ordered in ED Medications  lactated ringers infusion ( Intravenous New Bag/Given 01/28/23 0949)  cefTRIAXone (ROCEPHIN) 2 g in sodium chloride 0.9 % 100 mL IVPB (0 g Intravenous Stopped 01/28/23 0849)  azithromycin (ZITHROMAX) 500 mg in sodium chloride 0.9 % 250 mL IVPB (0 mg Intravenous Stopped 01/28/23 0949)  potassium chloride 10 mEq in 100 mL IVPB (10 mEq Intravenous New Bag/Given 01/28/23 1009)  lactated ringers bolus 1,000 mL (0 mLs Intravenous Stopped 01/28/23 0915)    And  lactated ringers bolus 500 mL (0 mLs Intravenous Stopped 01/28/23 0948)  ipratropium-albuterol (DUONEB) 0.5-2.5 (3) MG/3ML nebulizer solution 3 mL (3 mLs Nebulization Given 01/28/23 0811)  potassium chloride SA (KLOR-CON M) CR tablet 40 mEq (40 mEq Oral Given 01/28/23  4098)    ED Course/ Medical Decision Making/ A&P                                 Medical Decision Making Amount and/or Complexity of Data Reviewed Labs: ordered. Radiology: ordered. ECG/medicine tests: ordered.  Risk Prescription drug management. Decision regarding hospitalization.    BP (!) 91/59   Pulse (!) 102   Temp 98.3 F (36.8 C) (Oral)   Resp (!) 21   SpO2 92%   74:63 AM  47 year old female has not any history of polysubstance abuse, depression, recurrent lung infection, COPD, presenting with cold symptoms.  History of through patient and through mother who is at bedside. For the past 3 to 4 days patient has had subjective fever, chills, body aches, fatigue, nonproductive cough, mild increase shortness of breath and decreasing appetite.  She tries taking aspirin at home without adequate relief.  She does not endorse nausea vomiting or diarrhea no urinary symptoms no skin rash.  She admits to drug use including crack.  Denies alcohol use.  Had states that she occasionally has no shortness of breath because she does not have her rescue inhaler.  Denies history of IV drug use.  On exam this is a  chronically ill female, appears to be in some mild discomfort.  Skin is warm to the touch.  She is tachycardic and lungs with decreased breath sounds and occasional expiratory wheezes.  She does not appear to be fluid overloaded.  She does not exhibit any obvious focal neurodeficit.  Vital signs notable for soft blood pressure of 91/59, tachycardic with heart rate 102, tachypnea with a respiratory rate 21, and oxygen of 92% on room air.  Oral temperature is 98.3 however skin is warm to the touch.  -Labs ordered, independently viewed and interpreted by me.  Labs remarkable for UA showing UTI.  Treat with rocephin. WBC 13.7, normal lactic acid.  Pt was hypotensive and tachycardic therefore sepsis work up initiated.  IVF given at 29ml/kg. K+ 2.8, supplementation given.  -The patient was maintained on a cardiac monitor.  I personally viewed and interpreted the cardiac monitored which showed an underlying rhythm of: sinus tachycardia -Imaging independently viewed and interpreted by me and I agree with radiologist's interpretation.  Result remarkable for CXR without acute changes -This patient presents to the ED for concern of illness, this involves an extensive number of treatment options, and is a complaint that carries with it a high risk of complications and morbidity.  The differential diagnosis includes viral resp cause, pneumonia, UTI, cellulitis, electrolytes derangement -Co morbidities that complicate the patient evaluation includes hx of lung infection, COPD, tobacco use -Treatment includes rocephin/zithromax, IVF, potassium -Reevaluation of the patient after these medicines showed that the patient stayed the same -PCP office notes or outside notes reviewed -Discussion with specialist internal medicine resident who agrees to see and will admit pt -care discussed with DR. Trifan -Escalation to admission/observation considered: patient is agreeable with hospital admission  Sepsis reassessment  done         Final Clinical Impression(s) / ED Diagnoses Final diagnoses:  Acute lower UTI  Sepsis, due to unspecified organism, unspecified whether acute organ dysfunction present [facility])  Hypokalemia    Rx / DC Orders ED Discharge Orders     None         Fayrene Helper, PA-C 01/28/23 1035    Terald Sleeper, MD 01/28/23 1122

## 2023-01-28 NOTE — ED Triage Notes (Signed)
Pt c/o cough, generalized body aches, tactile fevers for 2-3 days. Denies CP, SOB, N/V/D. No meds PTA.

## 2023-01-28 NOTE — ED Notes (Signed)
Patient Ambulated to restroom because very SOB, SPO2 @83 -85%ra, patient place on 2liters of oxygen. SPO2 95-100%. MD made aware

## 2023-01-28 NOTE — ED Notes (Signed)
ED TO INPATIENT HANDOFF REPORT  ED Nurse Name and Phone #:   S Name/Age/Gender Deanna Klein 47 y.o. female Room/Bed: 018C/018C  Code Status   Code Status: Full Code  Home/SNF/Other Home Patient oriented to: self, place, time, and situation Is this baseline? Yes   Triage Complete: Triage complete  Chief Complaint Acute hypoxic respiratory failure (HCC) [J96.01]  Triage Note Pt c/o cough, generalized body aches, tactile fevers for 2-3 days. Denies CP, SOB, N/V/D. No meds PTA.    Allergies Allergies  Allergen Reactions   Darvocet [Propoxyphene N-Acetaminophen] Hives   Percocet [Oxycodone-Acetaminophen] Hives    Level of Care/Admitting Diagnosis ED Disposition     ED Disposition  Admit   Condition  --   Comment  Hospital Area: [facility] [[id number]]  Level of Care: Telemetry Medical [104]  May place patient in observation at [facility] or [facility] if equivalent level of care is available:: No  Covid Evaluation: Symptomatic Person Under Investigation (PUI) or recent exposure (last 10 days) *Testing Required*  Diagnosis: Acute hypoxic respiratory failure [facility]) [[id number]]  Admitting Physician: Silvio Pate  Attending Physician: Gust Rung [[id number]]          B Medical/Surgery History Past Medical History:  Diagnosis Date   Cocaine abuse (HCC)    Depression    Seizures (HCC)    STD (female)    Substance abuse (HCC)    crack cocaine   Past Surgical History:  Procedure Laterality Date   right lung collapse       A IV Location/Drains/Wounds Patient Lines/Drains/Airways Status     Active Line/Drains/Airways     Name Placement date Placement time Site Days   Peripheral IV 01/28/23 18 G Anterior;Right Forearm 01/28/23  0804  Forearm  less than 1            Intake/Output Last 24 hours No intake or output data in the 24 hours ending 01/28/23 1140  Labs/Imaging Results for orders placed or performed during the  hospital encounter of 01/28/23 (from the past 48 hour(s))  SARS Coronavirus 2 by RT PCR (hospital order, performed in [facility] hospital lab) *cepheid single result test* Anterior Nasal Swab     Status: None   Collection Time: 01/28/23  6:50 AM   Specimen: Anterior Nasal Swab  Result Value Ref Range   SARS Coronavirus 2 by RT PCR NEGATIVE NEGATIVE    Comment: Performed at [facility], [address], [place], [place] [address]  Resp panel by RT-PCR (RSV, Flu A&B, Covid) Anterior Nasal Swab     Status: None   Collection Time: 01/28/23  7:45 AM   Specimen: Anterior Nasal Swab  Result Value Ref Range   SARS Coronavirus 2 by RT PCR NEGATIVE NEGATIVE   Influenza A by PCR NEGATIVE NEGATIVE   Influenza B by PCR NEGATIVE NEGATIVE    Comment: (NOTE) The Xpert Xpress SARS-CoV-2/FLU/RSV plus assay is intended as an aid in the diagnosis of influenza from Nasopharyngeal swab specimens and should not be used as a sole basis for treatment. Nasal washings and aspirates are unacceptable for Xpert Xpress SARS-CoV-2/FLU/RSV testing.  Fact Sheet for Patients: BloggerCourse.com  Fact Sheet for Healthcare Providers: SeriousBroker.it  This test is not yet approved or cleared by the [place] FDA and has been authorized for detection and/or diagnosis of SARS-CoV-2 by FDA under an Emergency Use Authorization (EUA). This EUA will remain in effect (meaning this test can be used) for the  duration of the COVID-19 declaration under Section 564(b)(1) of the Act, 21 U.S.C. section 360bbb-3(b)(1), unless the authorization is terminated or revoked.     Resp Syncytial Virus by PCR NEGATIVE NEGATIVE    Comment: (NOTE) Fact Sheet for Patients: BloggerCourse.com  Fact Sheet for Healthcare Providers: SeriousBroker.it  This test is not yet approved or cleared by the [place] FDA and has been  authorized for detection and/or diagnosis of SARS-CoV-2 by FDA under an Emergency Use Authorization (EUA). This EUA will remain in effect (meaning this test can be used) for the duration of the COVID-19 declaration under Section 564(b)(1) of the Act, 21 U.S.C. section 360bbb-3(b)(1), unless the authorization is terminated or revoked.  Performed at [facility], [address], [place], [place] [address]   Comprehensive metabolic panel     Status: Abnormal   Collection Time: 01/28/23  7:56 AM  Result Value Ref Range   Sodium 135 135 - 145 mmol/L   Potassium 2.8 (L) 3.5 - 5.1 mmol/L   Chloride 98 98 - 111 mmol/L   CO2 23 22 - 32 mmol/L   Glucose, Bld 104 (H) 70 - 99 mg/dL    Comment: Glucose reference range applies only to samples taken after fasting for at least 8 hours.   BUN 27 (H) 6 - 20 mg/dL   Creatinine, Ser 1.30 (H) 0.44 - 1.00 mg/dL   Calcium 9.0 8.9 - 86.5 mg/dL   Total Protein 6.7 6.5 - 8.1 g/dL   Albumin 3.1 (L) 3.5 - 5.0 g/dL   AST 16 15 - 41 U/L   ALT 12 0 - 44 U/L   Alkaline Phosphatase 47 38 - 126 U/L   Total Bilirubin 0.3 0.3 - 1.2 mg/dL   GFR, Estimated 57 (L) >60 mL/min    Comment: (NOTE) Calculated using the CKD-EPI Creatinine Equation (2021)    Anion gap 14 5 - 15    Comment: Performed at [facility], [address] [place], [place] [address]  CBC with Differential     Status: Abnormal   Collection Time: 01/28/23  7:56 AM  Result Value Ref Range   WBC 13.7 (H) 4.0 - 10.5 K/uL   RBC 4.84 3.87 - 5.11 MIL/uL   Hemoglobin 14.2 12.0 - 15.0 g/dL   HCT 62.9 52.8 - 41.3 %   MCV 87.0 80.0 - 100.0 fL   MCH 29.3 26.0 - 34.0 pg   MCHC 33.7 30.0 - 36.0 g/dL   RDW 24.4 01.0 - 27.2 %   Platelets 170 150 - 400 K/uL   nRBC 0.0 0.0 - 0.2 %   Neutrophils Relative % 75 %   Neutro Abs 10.4 (H) 1.7 - 7.7 K/uL   Lymphocytes Relative 11 %   Lymphs Abs 1.4 0.7 - 4.0 K/uL   Monocytes Relative 13 %   Monocytes Absolute 1.8 (H) 0.1 - 1.0 K/uL   Eosinophils  Relative 0 %   Eosinophils Absolute 0.0 0.0 - 0.5 K/uL   Basophils Relative 0 %   Basophils Absolute 0.0 0.0 - 0.1 K/uL   Immature Granulocytes 1 %   Abs Immature Granulocytes 0.07 0.00 - 0.07 K/uL    Comment: Performed at [facility], [address] [place], [place] [address]  Protime-INR     Status: None   Collection Time: 01/28/23  7:56 AM  Result Value Ref Range   Prothrombin Time 14.2 11.4 - 15.2 seconds   INR 1.1 0.8 - 1.2  Comment: (NOTE) INR goal varies based on device and disease states. Performed at [facility], [address] [place], [place] [address]   APTT     Status: None   Collection Time: 01/28/23  7:56 AM  Result Value Ref Range   aPTT 33 24 - 36 seconds    Comment: Performed at [facility], [address], [place], [place] [address]  hCG, serum, qualitative     Status: None   Collection Time: 01/28/23  7:56 AM  Result Value Ref Range   Preg, Serum NEGATIVE NEGATIVE    Comment:        THE SENSITIVITY OF THIS METHODOLOGY IS >10 mIU/mL. Performed at [facility], [address], [place], [place] [address]   Magnesium     Status: None   Collection Time: 01/28/23  7:56 AM  Result Value Ref Range   Magnesium 2.0 1.7 - 2.4 mg/dL    Comment: Performed at [facility], [address], [place], [place] [address]  Urinalysis, w/ Reflex to Culture (Infection Suspected) -Urine, Clean Catch     Status: Abnormal   Collection Time: 01/28/23  7:58 AM  Result Value Ref Range   Specimen Source URINE, CLEAN CATCH    Color, Urine YELLOW YELLOW   APPearance TURBID (A) CLEAR   Specific Gravity, Urine 1.013 1.005 - 1.030   pH 5.0 5.0 - 8.0   Glucose, UA NEGATIVE NEGATIVE mg/dL   Hgb urine dipstick MODERATE (A) NEGATIVE   Bilirubin Urine NEGATIVE NEGATIVE   Ketones, ur NEGATIVE NEGATIVE mg/dL   Protein, ur 213 (A) NEGATIVE mg/dL   Nitrite POSITIVE (A) NEGATIVE   Leukocytes,Ua LARGE (A) NEGATIVE   RBC / HPF 21-50 0 - 5 RBC/hpf   WBC,  UA >50 0 - 5 WBC/hpf    Comment:        Reflex urine culture not performed if WBC <=10, OR if Squamous epithelial cells >5. If Squamous epithelial cells >5 suggest recollection.    Bacteria, UA MANY (A) NONE SEEN   Squamous Epithelial / HPF 6-10 0 - 5 /HPF   WBC Clumps PRESENT    Mucus PRESENT    Hyaline Casts, UA PRESENT     Comment: Performed at [facility], [address] [place], [place] [address]  I-Stat Lactic Acid, ED     Status: None   Collection Time: 01/28/23  8:27 AM  Result Value Ref Range   Lactic Acid, Venous 1.1 0.5 - 1.9 mmol/L   DG Chest Port 1 View  Result Date: 01/28/2023 CLINICAL DATA:  Questionable sepsis - evaluate for abnormality EXAM: PORTABLE CHEST 1 VIEW COMPARISON:  01/01/2023. FINDINGS: Bilateral lungs appear hyperexpanded and hyperlucent with coarse bronchovascular markings, in keeping with COPD. There are bullous changes, asymmetrically involving the right upper lobe. Bilateral lungs otherwise appear clear. No dense consolidation. Bilateral costophrenic angles are clear. Normal cardio-mediastinal silhouette. No acute osseous abnormalities. The soft tissues are within normal limits. IMPRESSION: 1. COPD. 2. No acute cardiothoracic abnormality. Electronically Signed   By: Jules Schick M.D.   On: 01/28/2023 09:01    Pending Labs Unresulted Labs (From admission, onward)     Start     Ordered   01/29/23 0500  CBC  Tomorrow morning,   R        01/28/23 1119   01/29/23 0500  Basic metabolic panel  Tomorrow morning,   R        01/28/23 1121  01/28/23 1300  Basic metabolic panel  Once,   R        01/28/23 1120   01/28/23 1136  Blood gas, venous  Once,   R        01/28/23 1135   01/28/23 1123  Respiratory (~20 pathogens) panel by PCR  (Respiratory panel by PCR (~20 pathogens, ~24 hr TAT)  w precautions)  Once,   R        01/28/23 1122   01/28/23 0832  Urine Culture  Add-on,   AD       Question:  Indication  Answer:  Dysuria   01/28/23 0831    01/28/23 0745  Blood Culture (routine x 2)  (Septic presentation on arrival (screening labs, nursing and treatment orders for obvious sepsis))  BLOOD CULTURE X 2,   STAT      01/28/23 0746            Vitals/Pain Today's Vitals   01/28/23 0845 01/28/23 0915 01/28/23 0945 01/28/23 1000  BP: 101/68 94/69 (!) 106/95 (!) 86/66  Pulse: 93 92 91 94  Resp: (!) 21 (!) 21 (!) 30 (!) 21  Temp:      TempSrc:      SpO2: 92% 90% 100% 100%  PainSc:        Isolation Precautions Droplet precaution  Medications Medications  lactated ringers infusion ( Intravenous New Bag/Given 01/28/23 0949)  cefTRIAXone (ROCEPHIN) 2 g in sodium chloride 0.9 % 100 mL IVPB (0 g Intravenous Stopped 01/28/23 0849)  azithromycin (ZITHROMAX) 500 mg in sodium chloride 0.9 % 250 mL IVPB (0 mg Intravenous Stopped 01/28/23 0949)  potassium chloride 10 mEq in 100 mL IVPB (10 mEq Intravenous New Bag/Given 01/28/23 1105)  rivaroxaban (XARELTO) tablet 10 mg (has no administration in time range)  ipratropium (ATROVENT HFA) inhaler 2 puff (has no administration in time range)  predniSONE (DELTASONE) tablet 40 mg (has no administration in time range)  lactated ringers bolus 1,000 mL (0 mLs Intravenous Stopped 01/28/23 0915)    And  lactated ringers bolus 500 mL (0 mLs Intravenous Stopped 01/28/23 0948)  ipratropium-albuterol (DUONEB) 0.5-2.5 (3) MG/3ML nebulizer solution 3 mL (3 mLs Nebulization Given 01/28/23 0811)  potassium chloride SA (KLOR-CON M) CR tablet 40 mEq (40 mEq Oral Given 01/28/23 8295)    Mobility walks     Focused Assessments    R Recommendations: See Admitting Provider Note  Report given to:   Additional Notes:

## 2023-01-28 NOTE — Sepsis Progress Note (Signed)
Code Sepsis protocol being monitored by eLink. 

## 2023-01-28 NOTE — H&P (Signed)
Date: 01/28/2023               Patient Name:  Deanna Klein MRN: [id number]  DOB: September 22, 1975 Age / Sex: 47 y.o., female   PCP: Patient, No Pcp Per         Medical Service: Internal Medicine Teaching Service         Attending Physician: Dr. Gust Rung, DO      First Contact: Dr. Monna Fam, MD Pager [phone]    Second Contact: Dr. Olegario Messier, MD Pager [phone]         After Hours (After 5p/  First Contact Pager: [phone]  weekends / holidays): Second Contact Pager: [phone]   SUBJECTIVE   Chief Complaint:   History of Present Illness:  Deanna Klein is past medical history of COPD, history of necrotizing pneumonia and prior pneumothorax, and polysubstance use disorder who presents with fatigue, worsening shortness of breath, and chills for last 5 days. She has not been eating and drinking well for the last few days as her appetite has been low. She denies sinus congestion, cough, nausea, diarrhea and headaches. Her breathing feels about the same as normal, she does note some wheezing but denies increased sputum production or purulence. She has not had burning with urination but does endorse decreased urine output in the last few days. She currently lives in a motel or her car outside of her mom's house.  ED Course: In the ED she was noted to be tachypneic with MAP >65 but lower pressures. Lab evaluation showed mild leukocytosis, lactate normal, and AKI. Ua showed hemaglobin with protein, nitrites and leukocytes.  Meds:  Current Meds  Medication Sig   albuterol (VENTOLIN HFA) 108 (90 Base) MCG/ACT inhaler Inhale 1-2 puffs into the lungs every 6 (six) hours as needed for wheezing or shortness of breath. (Patient taking differently: Inhale 2 puffs into the lungs every 6 (six) hours as needed for wheezing or shortness of breath.)   amoxicillin-clavulanate (AUGMENTIN) 875-125 MG tablet Take 1 tablet by mouth every 12 (twelve) hours.   She has been out of inhalers for several  days.  Past Medical History COPD Necrotizing pneumonia History of pneumothorax Polysubstance use  Social:  Lives With: motel and occasionally homeless, patient states she lives with her mom Occupation: disabled Support: mother in area Level of Function: iADLs PCP: none Substances: cocaine, cigarettes about 1 PPD when she can afford them, denies IV drug use  Allergies: Allergies as of 01/28/2023 - Review Complete 01/28/2023  Allergen Reaction Noted   Darvocet [propoxyphene n-acetaminophen] Hives 05/30/2011   Percocet [oxycodone-acetaminophen] Hives 05/30/2011    Review of Systems: A complete ROS was negative except as per HPI.   OBJECTIVE:   Physical Exam: Blood pressure (!) 86/66, pulse 94, temperature 98.1 F (36.7 C), temperature source Oral, resp. rate (!) 21, SpO2 100%.  Constitutional: ill-appearing, in no acute distress Cardiovascular: regular rate and rhythm, no m/r/g Pulmonary/Chest: normal work of breathing on room air, diffuse wheezing throughout lung fields bilaterally Abdominal: soft, non-tender, non-distended MSK: normal bulk and tone, no lower extremity edema, no CVA tenderness Neurological: alert & oriented x 3 Skin: warm and dry  Labs: CBC    Component Value Date/Time   WBC 13.7 (H) 01/28/2023 0756   RBC 4.84 01/28/2023 0756   HGB 14.2 01/28/2023 0756   HCT 42.1 01/28/2023 0756   PLT 170 01/28/2023 0756   MCV 87.0 01/28/2023 0756   MCH 29.3 01/28/2023 0756   MCHC 33.7 01/28/2023  0756   RDW 13.7 01/28/2023 0756   LYMPHSABS 1.4 01/28/2023 0756   MONOABS 1.8 (H) 01/28/2023 0756   EOSABS 0.0 01/28/2023 0756   BASOSABS 0.0 01/28/2023 0756     CMP     Component Value Date/Time   NA 134 (L) 01/28/2023 1502   K 4.3 01/28/2023 1502   CL 103 01/28/2023 1502   CO2 23 01/28/2023 1502   GLUCOSE 124 (H) 01/28/2023 1502   BUN 17 01/28/2023 1502   CREATININE 0.95 01/28/2023 1502   CALCIUM 8.5 (L) 01/28/2023 1502   PROT 6.7 01/28/2023 0756    ALBUMIN 3.1 (L) 01/28/2023 0756   AST 16 01/28/2023 0756   ALT 12 01/28/2023 0756   ALKPHOS 47 01/28/2023 0756   BILITOT 0.3 01/28/2023 0756   GFRNONAA >60 01/28/2023 1502   GFRAA >60 09/09/2017 1505    Imaging:  EKG: personally reviewed my interpretation is sinus rhythm.   ASSESSMENT & PLAN:   Assessment & Plan by Problem: Principal Problem:   Acute hypoxic respiratory failure (HCC) Active Problems:   Sepsis (HCC)   UTI (urinary tract infection)   Hypokalemia   Deanna Klein is a 47 y.o. person living with a history of COPD, history of necrotizing pneumonia and prior pneumothorax, and polysubstance use disorder who presented with chills, fatigue, and decreased appetite and admitted for UTI on hospital day 0  Leukocytosis UTI SIRs criteria without end organ dysfunction She endorses chills, fatigue, and decreased urine output in last few days. She was febrile to 103 F.  WBC elevated to 13.7. Differentials include bacteremia vs UTI vs Viral infection. UA + nitrites and leukocytes. Covid, flu and RSV negative. No signs of consolidation on CXR.  Her elevated creatinine is most likely from decreased PO intake rather than end organ damage.  -ceftriaxone 2g for 5 days for UTI -blood cx pending -RVP  Acute hypoxic respiratory failure COPD She does have some wheezing on exam but no increased sputum production or purulence. Will treat for COPD exacerbation. -prednisone 40 mg 5 days -azithromycin 500 mg for 3 days -ambulatory saturation tomorrow  Elevated creatinine Baseline creatinine of 0.8. Her creatinine was elevated at 1.17. I spoke with patient's mother and she states that she has not been drinking fluids well in the last few days. -strict I and Os -trend BMP  Hypokalemia PO potassium given in ED. -repeat BMP  Diet: Normal VTE: DOAC IVF: LR,50cc/hr Code: Full  Prior to Admission Living Arrangement: Homeless living outside moms house Anticipated Discharge Location:  Home Barriers to Discharge: fevers, hypoxia  Dispo: Admit patient to Observation with expected length of stay less than 2 midnights.  Signed: Rudene Christians, DO Internal Medicine Resident PGY-3  01/28/2023, 5:10 PM

## 2023-01-29 ENCOUNTER — Other Ambulatory Visit (HOSPITAL_COMMUNITY): Payer: Self-pay

## 2023-01-29 DIAGNOSIS — N390 Urinary tract infection, site not specified: Secondary | ICD-10-CM | POA: Diagnosis not present

## 2023-01-29 DIAGNOSIS — J441 Chronic obstructive pulmonary disease with (acute) exacerbation: Secondary | ICD-10-CM

## 2023-01-29 MED ORDER — CEPHALEXIN 500 MG PO CAPS
500.0000 mg | ORAL_CAPSULE | Freq: Four times a day (QID) | ORAL | 0 refills | Status: AC
  Filled 2023-01-29: qty 12, 3d supply, fill #0

## 2023-01-29 MED ORDER — BUDESONIDE-FORMOTEROL FUMARATE 80-4.5 MCG/ACT IN AERO
2.0000 | INHALATION_SPRAY | Freq: Two times a day (BID) | RESPIRATORY_TRACT | 0 refills | Status: DC
Start: 2023-01-29 — End: 2024-02-01
  Filled 2023-01-29: qty 10.2, 30d supply, fill #0

## 2023-01-29 MED ORDER — ALBUTEROL SULFATE HFA 108 (90 BASE) MCG/ACT IN AERS
1.0000 | INHALATION_SPRAY | Freq: Four times a day (QID) | RESPIRATORY_TRACT | 2 refills | Status: DC | PRN
Start: 2023-01-29 — End: 2024-03-17
  Filled 2023-01-29 – 2023-02-25 (×2): qty 18, 25d supply, fill #0
  Filled 2023-04-17 (×2): qty 18, 25d supply, fill #1

## 2023-01-29 NOTE — Discharge Summary (Addendum)
Name: Deanna Klein MRN: [id number] DOB: 02/03/1976 47 y.o. PCP: Patient, No Pcp Per  Date of Admission: 01/28/2023  6:31 AM Date of Discharge: 01/29/23 Attending Physician: Dr. Cleda Daub  Discharge Diagnosis: Active Problems:   UTI (urinary tract infection)    Discharge Medications: Allergies as of 01/29/2023       Reactions   Darvocet [propoxyphene N-acetaminophen] Hives   Percocet [oxycodone-acetaminophen] Hives        Medication List     STOP taking these medications    amoxicillin-clavulanate 875-125 MG tablet Commonly known as: AUGMENTIN       TAKE these medications    albuterol 108 (90 Base) MCG/ACT inhaler Commonly known as: VENTOLIN HFA Inhale 1-2 puffs into the lungs every 6 (six) hours as needed for wheezing or shortness of breath. What changed: how much to take   budesonide-formoterol 80-4.5 MCG/ACT inhaler Commonly known as: SYMBICORT Inhale 1 puff into the lungs daily.   cephALEXin 500 MG capsule Commonly known as: KEFLEX Take 1 capsule (500 mg total) by mouth 4 (four) times daily for 3 days.   divalproex 500 MG DR tablet Commonly known as: DEPAKOTE Take 500 mg by mouth once. Pt took this from an old rx. Pt was on the rx over a year ago.        Disposition and follow-up:   [name] was discharged from [facility] in Stable condition.  At the hospital follow up visit please address:  1.  Follow-up:  a. UTI: check for dysuria, abdominal pain, stable vitals, check patient completed abx    b. COPD: ensure patient has been using albuterol inhaler and Symbicort, check for further shortness of breath   c. Elevated creatinine: Cr in ED 1.17, check for appropriate oral intake and signs of dehydration   2.  Labs / imaging needed at time of follow-up: none  3.  Pending labs/ test needing follow-up: none  4.  Medication Changes  Started: Breo Ellipta inhaler  Abx - Keflex 500mg  qid  End Date: 8/11  Follow-up  Appointments: schedule with PCP   Hospital Course by problem list:   #Sepsis due to UTI without end organ dysfunction In the ED patient had leukocytosis to 13.7, tachypnea, hypotension. UA showed positive nitrites and leukocytes. Normal lactate and no evidence of end organ damage. Started empirically on ceftriaxone, received two doses. Patient felt strongly she wished to leave the hospital, so was discharged on hospital day 1 with further 3 day course of oral Keflex.   #Acute Hypoxic Respiratory Failure #COPD Exacerbation Patient presented with shortness of breath, increased bilateral wheezing with no increased sputum production. Patient only uses albuterol inhaler at home, which she has been out for the past several days. CXR showed no acute cardiopulmonary process. On ambulation, O2 sats decreased to mid 80s. Patient received Duonebs, Atrovent inhaler, oral prednisone 40mg , and azithromycin x1. At time of discharge, patient's ambulating oxygen saturations were stable. We discharged patient with albuterol and Symbicort inhaler.   #Elevated creatinine Creatinine was elevated to 1.2 from baseline 0.8. Likely prerenal etiology in setting of decreased oral intake for the past several days. Patient received IV fluids for volume repletion  #Hypokalemia Patient's potassium was 2.8 at presentation, with improvement to 4.3 after oral repletion  Discharge Subjective: Saw patient at bedside this morning. She was extremely anxious to get out of the hospital. Denies shortness of breath, abdominal pain, dysuria. Agreed with plan to be discharged with inhaler refills, oral antibiotics,  and to make her own hospital follow up appointment with her PCP  Discharge Exam:   BP 102/64 (BP Location: Right Arm)   Pulse 94   Temp (!) 97.5 F (36.4 C) (Oral)   Resp 19   SpO2 95%  Constitutional: well-appearing, in no acute distress HENT: normocephalic atraumatic, mucous membranes moist Eyes: conjunctiva  non-erythematous Neck: supple Cardiovascular: regular rate and rhythm, no m/r/g Pulmonary/Chest: diffuse wheezes bilaterally Abdominal: soft, non-tender, non-distended MSK: normal bulk and tone Neurological: alert & oriented x 3, 5/5 strength in bilateral upper and lower extremities, normal gait Skin: warm and dry  Pertinent Labs, Studies, and Procedures:     Latest Ref Rng & Units 01/29/2023   10:05 AM 01/28/2023    7:56 AM 12/16/2022    1:50 AM  CBC  WBC 4.0 - 10.5 K/uL 10.6  13.7  12.8   Hemoglobin 12.0 - 15.0 g/dL 16.1  09.6  04.5   Hematocrit 36.0 - 46.0 % 38.8  42.1  43.5   Platelets 150 - 400 K/uL 168  170  285        Latest Ref Rng & Units 01/28/2023    3:02 PM 01/28/2023    7:56 AM 12/16/2022    1:50 AM  CMP  Glucose 70 - 99 mg/dL 409  811  914   BUN 6 - 20 mg/dL 17  27  15    Creatinine 0.44 - 1.00 mg/dL 7.82  9.56  2.13   Sodium 135 - 145 mmol/L 134  135  138   Potassium 3.5 - 5.1 mmol/L 4.3  2.8  3.4   Chloride 98 - 111 mmol/L 103  98  105   CO2 22 - 32 mmol/L 23  23  24    Calcium 8.9 - 10.3 mg/dL 8.5  9.0  8.8   Total Protein 6.5 - 8.1 g/dL  6.7  6.3   Total Bilirubin 0.3 - 1.2 mg/dL  0.3  0.5   Alkaline Phos 38 - 126 U/L  47  58   AST 15 - 41 U/L  16  19   ALT 0 - 44 U/L  12  11     DG Chest Port 1 View  Result Date: 01/28/2023 CLINICAL DATA:  Questionable sepsis - evaluate for abnormality EXAM: PORTABLE CHEST 1 VIEW COMPARISON:  01/01/2023. FINDINGS: Bilateral lungs appear hyperexpanded and hyperlucent with coarse bronchovascular markings, in keeping with COPD. There are bullous changes, asymmetrically involving the right upper lobe. Bilateral lungs otherwise appear clear. No dense consolidation. Bilateral costophrenic angles are clear. Normal cardio-mediastinal silhouette. No acute osseous abnormalities. The soft tissues are within normal limits. IMPRESSION: 1. COPD. 2. No acute cardiothoracic abnormality. Electronically Signed   By: Jules Schick M.D.   On:  01/28/2023 09:01     Discharge Instructions: Discharge Instructions     Diet - low sodium heart healthy   Complete by: As directed    Increase activity slowly   Complete by: As directed        Signed: Monna Fam, MD PGY-1 01/29/2023, 11:05 AM   Pager: [phone]

## 2023-01-29 NOTE — Progress Notes (Signed)
DISCHARGE NOTE HOME TAWNNI BESTER to be discharged Home per MD order. Diagnosis, treatment, prescriptions and follow up appointments discussed  with the patient. Prescriptions given to patient; medication list explained in detail. Patient verbalized understanding.  Skin clean, dry and intact without evidence of skin break down, no evidence of skin tears noted. IV catheter discontinued intact. Site without signs and symptoms of complications. Dressing and pressure applied. Pt denies pain at the site currently. No complaints noted.  Patient free of lines, drains, and wounds.   An After Visit Summary (AVS) was printed and given to the patient. Patient escorted via wheelchair, and discharged home via private auto.  Tresa Endo, RN

## 2023-01-29 NOTE — Discharge Instructions (Addendum)
You were hospitalized for acute hypoxic respiratory failure and a UTI. You were treated with IV antibiotics, fluids, and duoneb breathing treatments. Your ambulating O2 saturations have improved. At this time I feel comfortable discharging you with oral antibiotics and PCP follow-up. Thank you for allowing [place] to be part of your care.   Please call your PCP for hospital follow-up  Please note these changes made to your medications:   *Please START taking:  Albuterol inhaler Symbicort inhaler daily Keflex antibiotic 4 times daily for three more days   Please make sure to return to the hospital if you have increased pain, fever, shortness of breath.   Please call our clinic if you have any questions or concerns, we may be able to help and keep you from a long and expensive emergency room wait. Our clinic and after hours phone number is [phone], the best time to call is Monday through Friday 9 am to 4 pm but there is always someone available 24/7 if you have an emergency. If you need medication refills please notify your pharmacy one week in advance and they will send [place] a request.

## 2023-01-29 NOTE — Progress Notes (Signed)
SATURATION QUALIFICATIONS: (This note is used to comply with regulatory documentation for home oxygen)  Patient Saturations on Room Air at Rest = 94%  Patient Saturations on Room Air while Ambulating = 94%  Patient Saturations on 0 Liters of oxygen while Ambulating = n/a  Please briefly explain why patient needs home oxygen:

## 2023-01-29 NOTE — Progress Notes (Signed)
PT Cancellation Note  Patient Details Name: Deanna Klein MRN: [id number] DOB: May 21, 1976   Cancelled Treatment:    Reason Eval/Treat Not Completed: Other (comment).  Pt is up to walk around room independently and reports she is leaving imminently.  Will follow up as time and pt allow, for now refusing therapy.   Ivar Drape 01/29/2023, 10:25 AM  Samul Dada, PT PhD Acute Rehab Dept. Number: [facility] [id number] and [facility] [phone]

## 2023-01-29 NOTE — Plan of Care (Signed)

## 2023-01-29 NOTE — TOC Transition Note (Signed)
Transition of Care [facility]) - CM/SW Discharge Note   Patient Details  Name: Deanna Klein MRN: [id number] Date of Birth: January 04, 1976  [facility] CM/SW Contact:  Tom-Johnson, Hershal Coria, RN Phone Number: 01/29/2023, 12:09 PM   Clinical Narrative:     Patient is scheduled for discharge today.  Discharge instructions on AVS. Prescriptions sent to [facility] pharmacy and meds will be delivered to patient at bedside prior discharge. No TOC needs or recommendations noted. Mother, Sharl Ma to transport at discharge.  No further TOC needs noted.       Final next level of care: Home/Self Care Barriers to Discharge: Barriers Resolved   Patient Goals and CMS Choice CMS Medicare.gov Compare Post Acute Care list provided to:: Patient Choice offered to / list presented to : NA  Discharge Placement                  Patient to be transferred to facility by: Mother Name of family member notified: [facility]    Discharge Plan and Services Additional resources added to the After Visit Summary for                  DME Arranged: N/A DME Agency: NA       HH Arranged: NA HH Agency: NA        Social Determinants of Health (SDOH) Interventions SDOH Screenings   Food Insecurity: No Food Insecurity (01/28/2023)  Housing: High Risk (01/28/2023)  Transportation Needs: No Transportation Needs (01/28/2023)  Utilities: Not At Risk (01/28/2023)  Tobacco Use: High Risk (01/28/2023)     Readmission Risk Interventions     No data to display

## 2023-02-25 ENCOUNTER — Other Ambulatory Visit (HOSPITAL_COMMUNITY): Payer: Self-pay

## 2023-04-17 ENCOUNTER — Other Ambulatory Visit: Payer: Self-pay

## 2023-04-17 ENCOUNTER — Other Ambulatory Visit (HOSPITAL_COMMUNITY): Payer: Self-pay

## 2023-08-19 ENCOUNTER — Other Ambulatory Visit: Payer: Self-pay

## 2023-08-19 ENCOUNTER — Inpatient Hospital Stay (HOSPITAL_COMMUNITY)
Admission: EM | Admit: 2023-08-19 | Discharge: 2023-08-21 | DRG: 871 | Discharge status: Against Medical Advice | Payer: Medicare HMO | Attending: Family Medicine | Admitting: Family Medicine

## 2023-08-19 ENCOUNTER — Encounter (HOSPITAL_COMMUNITY): Payer: Self-pay | Admitting: *Deleted

## 2023-08-19 DIAGNOSIS — N12 Tubulo-interstitial nephritis, not specified as acute or chronic: Secondary | ICD-10-CM

## 2023-08-19 DIAGNOSIS — Z7951 Long term (current) use of inhaled steroids: Secondary | ICD-10-CM

## 2023-08-19 DIAGNOSIS — F17210 Nicotine dependence, cigarettes, uncomplicated: Secondary | ICD-10-CM | POA: Diagnosis present

## 2023-08-19 DIAGNOSIS — Z885 Allergy status to narcotic agent status: Secondary | ICD-10-CM

## 2023-08-19 DIAGNOSIS — N390 Urinary tract infection, site not specified: Secondary | ICD-10-CM | POA: Diagnosis present

## 2023-08-19 DIAGNOSIS — F1910 Other psychoactive substance abuse, uncomplicated: Secondary | ICD-10-CM | POA: Diagnosis present

## 2023-08-19 DIAGNOSIS — J449 Chronic obstructive pulmonary disease, unspecified: Secondary | ICD-10-CM | POA: Diagnosis present

## 2023-08-19 DIAGNOSIS — Z1612 Extended spectrum beta lactamase (ESBL) resistance: Secondary | ICD-10-CM | POA: Diagnosis present

## 2023-08-19 DIAGNOSIS — N3090 Cystitis, unspecified without hematuria: Secondary | ICD-10-CM | POA: Diagnosis present

## 2023-08-19 DIAGNOSIS — G40909 Epilepsy, unspecified, not intractable, without status epilepticus: Secondary | ICD-10-CM | POA: Diagnosis present

## 2023-08-19 DIAGNOSIS — A4151 Sepsis due to Escherichia coli [E. coli]: Secondary | ICD-10-CM | POA: Diagnosis not present

## 2023-08-19 DIAGNOSIS — Z8701 Personal history of pneumonia (recurrent): Secondary | ICD-10-CM

## 2023-08-19 DIAGNOSIS — A419 Sepsis, unspecified organism: Principal | ICD-10-CM

## 2023-08-19 DIAGNOSIS — L89159 Pressure ulcer of sacral region, unspecified stage: Secondary | ICD-10-CM | POA: Diagnosis present

## 2023-08-19 DIAGNOSIS — B9629 Other Escherichia coli [E. coli] as the cause of diseases classified elsewhere: Secondary | ICD-10-CM | POA: Insufficient documentation

## 2023-08-19 DIAGNOSIS — Z681 Body mass index (BMI) 19 or less, adult: Secondary | ICD-10-CM

## 2023-08-19 DIAGNOSIS — E43 Unspecified severe protein-calorie malnutrition: Secondary | ICD-10-CM | POA: Diagnosis present

## 2023-08-19 DIAGNOSIS — E876 Hypokalemia: Secondary | ICD-10-CM | POA: Diagnosis present

## 2023-08-19 DIAGNOSIS — Z5329 Procedure and treatment not carried out because of patient's decision for other reasons: Secondary | ICD-10-CM | POA: Diagnosis present

## 2023-08-19 LAB — CBC WITH DIFFERENTIAL/PLATELET
Abs Immature Granulocytes: 0.07 10*3/uL (ref 0.00–0.07)
Basophils Absolute: 0 10*3/uL (ref 0.0–0.1)
Basophils Relative: 0 %
Eosinophils Absolute: 0 10*3/uL (ref 0.0–0.5)
Eosinophils Relative: 0 %
HCT: 43.8 % (ref 36.0–46.0)
Hemoglobin: 15.1 g/dL — ABNORMAL HIGH (ref 12.0–15.0)
Immature Granulocytes: 1 %
Lymphocytes Relative: 14 %
Lymphs Abs: 1.9 10*3/uL (ref 0.7–4.0)
MCH: 30.8 pg (ref 26.0–34.0)
MCHC: 34.5 g/dL (ref 30.0–36.0)
MCV: 89.4 fL (ref 80.0–100.0)
Monocytes Absolute: 1.4 10*3/uL — ABNORMAL HIGH (ref 0.1–1.0)
Monocytes Relative: 11 %
Neutro Abs: 9.8 10*3/uL — ABNORMAL HIGH (ref 1.7–7.7)
Neutrophils Relative %: 74 %
Platelets: 178 10*3/uL (ref 150–400)
RBC: 4.9 MIL/uL (ref 3.87–5.11)
RDW: 12.8 % (ref 11.5–15.5)
WBC: 13.3 10*3/uL — ABNORMAL HIGH (ref 4.0–10.5)
nRBC: 0 % (ref 0.0–0.2)

## 2023-08-19 LAB — COMPREHENSIVE METABOLIC PANEL
ALT: 11 U/L (ref 0–44)
AST: 15 U/L (ref 15–41)
Albumin: 3.5 g/dL (ref 3.5–5.0)
Alkaline Phosphatase: 48 U/L (ref 38–126)
Anion gap: 14 (ref 5–15)
BUN: 13 mg/dL (ref 6–20)
CO2: 21 mmol/L — ABNORMAL LOW (ref 22–32)
Calcium: 9.3 mg/dL (ref 8.9–10.3)
Chloride: 99 mmol/L (ref 98–111)
Creatinine, Ser: 0.92 mg/dL (ref 0.44–1.00)
GFR, Estimated: >60 mL/min (ref >60)
Glucose, Bld: 105 mg/dL — ABNORMAL HIGH (ref 70–99)
Potassium: 3.3 mmol/L — ABNORMAL LOW (ref 3.5–5.1)
Sodium: 134 mmol/L — ABNORMAL LOW (ref 135–145)
Total Bilirubin: 0.9 mg/dL (ref 0.0–1.2)
Total Protein: 7 g/dL (ref 6.5–8.1)

## 2023-08-19 LAB — HCG, SERUM, QUALITATIVE: Preg, Serum: NEGATIVE

## 2023-08-19 LAB — URINALYSIS, W/ REFLEX TO CULTURE (INFECTION SUSPECTED)
Bilirubin Urine: NEGATIVE
Glucose, UA: NEGATIVE mg/dL
Ketones, ur: NEGATIVE mg/dL
Nitrite: NEGATIVE
Protein, ur: 100 mg/dL — AB
Specific Gravity, Urine: 1.011 (ref 1.005–1.030)
WBC, UA: >50 WBC/hpf (ref 0–5)
pH: 5 (ref 5.0–8.0)

## 2023-08-19 LAB — I-STAT CG4 LACTIC ACID, ED: Lactic Acid, Venous: 0.5 mmol/L (ref 0.5–1.9)

## 2023-08-19 NOTE — ED Provider Triage Note (Signed)
 Emergency Medicine Provider Triage Evaluation Note  Deanna Klein , a 48 y.o. female  was evaluated in triage.  Pt complains of dysuria. Symptoms x 2 days. Has not checked her temp but feels warm. Has had sepsis from UTI previously.   Review of Systems  Positive:  Negative:   Physical Exam  BP 90/79   Pulse (!) 121   Temp 99.3 F (37.4 C)   Resp 19   Ht 5\' 4"  (1.626 m)   Wt 45 kg   LMP  (LMP Unknown)   SpO2 97%   BMI 17.03 kg/m  Gen:   Awake, no distress   Resp:  Normal effort  MSK:   Moves extremities without difficulty  Other:  Unwell appearing  Medical Decision Making  Medically screening exam initiated at 7:58 PM.  Appropriate orders placed.  Deanna Klein was informed that the remainder of the evaluation will be completed by another provider, this initial triage assessment does not replace that evaluation, and the importance of remaining in the ED until their evaluation is complete.     Silva Bandy, PA-C 08/19/23 [phone]

## 2023-08-19 NOTE — ED Triage Notes (Signed)
 The pt reports that she has had a bladder infection for 2 days  with a temp  lmp  unknown

## 2023-08-20 ENCOUNTER — Emergency Department (HOSPITAL_COMMUNITY): Payer: Medicare HMO

## 2023-08-20 DIAGNOSIS — E876 Hypokalemia: Secondary | ICD-10-CM | POA: Diagnosis present

## 2023-08-20 DIAGNOSIS — Z7951 Long term (current) use of inhaled steroids: Secondary | ICD-10-CM | POA: Diagnosis not present

## 2023-08-20 DIAGNOSIS — N12 Tubulo-interstitial nephritis, not specified as acute or chronic: Secondary | ICD-10-CM | POA: Diagnosis present

## 2023-08-20 DIAGNOSIS — J449 Chronic obstructive pulmonary disease, unspecified: Secondary | ICD-10-CM | POA: Diagnosis present

## 2023-08-20 DIAGNOSIS — G40909 Epilepsy, unspecified, not intractable, without status epilepticus: Secondary | ICD-10-CM | POA: Diagnosis present

## 2023-08-20 DIAGNOSIS — F1910 Other psychoactive substance abuse, uncomplicated: Secondary | ICD-10-CM | POA: Diagnosis present

## 2023-08-20 DIAGNOSIS — Z5329 Procedure and treatment not carried out because of patient's decision for other reasons: Secondary | ICD-10-CM | POA: Diagnosis present

## 2023-08-20 DIAGNOSIS — Z681 Body mass index (BMI) 19 or less, adult: Secondary | ICD-10-CM | POA: Diagnosis not present

## 2023-08-20 DIAGNOSIS — A419 Sepsis, unspecified organism: Secondary | ICD-10-CM

## 2023-08-20 DIAGNOSIS — B9629 Other Escherichia coli [E. coli] as the cause of diseases classified elsewhere: Secondary | ICD-10-CM | POA: Diagnosis not present

## 2023-08-20 DIAGNOSIS — Z885 Allergy status to narcotic agent status: Secondary | ICD-10-CM | POA: Diagnosis not present

## 2023-08-20 DIAGNOSIS — N3090 Cystitis, unspecified without hematuria: Secondary | ICD-10-CM | POA: Diagnosis present

## 2023-08-20 DIAGNOSIS — N390 Urinary tract infection, site not specified: Secondary | ICD-10-CM | POA: Diagnosis present

## 2023-08-20 DIAGNOSIS — F17210 Nicotine dependence, cigarettes, uncomplicated: Secondary | ICD-10-CM | POA: Diagnosis present

## 2023-08-20 DIAGNOSIS — L89159 Pressure ulcer of sacral region, unspecified stage: Secondary | ICD-10-CM | POA: Diagnosis present

## 2023-08-20 DIAGNOSIS — A4151 Sepsis due to Escherichia coli [E. coli]: Secondary | ICD-10-CM | POA: Diagnosis present

## 2023-08-20 DIAGNOSIS — E43 Unspecified severe protein-calorie malnutrition: Secondary | ICD-10-CM | POA: Diagnosis present

## 2023-08-20 DIAGNOSIS — Z8701 Personal history of pneumonia (recurrent): Secondary | ICD-10-CM | POA: Diagnosis not present

## 2023-08-20 DIAGNOSIS — Z1612 Extended spectrum beta lactamase (ESBL) resistance: Secondary | ICD-10-CM | POA: Diagnosis present

## 2023-08-20 LAB — CBC
HCT: 40.7 % (ref 36.0–46.0)
Hemoglobin: 13.7 g/dL (ref 12.0–15.0)
MCH: 30.4 pg (ref 26.0–34.0)
MCHC: 33.7 g/dL (ref 30.0–36.0)
MCV: 90.4 fL (ref 80.0–100.0)
Platelets: 168 10*3/uL (ref 150–400)
RBC: 4.5 MIL/uL (ref 3.87–5.11)
RDW: 12.9 % (ref 11.5–15.5)
WBC: 11.1 10*3/uL — ABNORMAL HIGH (ref 4.0–10.5)
nRBC: 0 % (ref 0.0–0.2)

## 2023-08-20 LAB — LACTIC ACID, PLASMA
Lactic Acid, Venous: 1.6 mmol/L (ref 0.5–1.9)
Lactic Acid, Venous: 0.9 mmol/L (ref 0.5–1.9)

## 2023-08-20 LAB — CREATININE, SERUM
Creatinine, Ser: 0.97 mg/dL (ref 0.44–1.00)
GFR, Estimated: >60 mL/min (ref >60)

## 2023-08-20 MED ORDER — POLYETHYLENE GLYCOL 3350 17 G PO PACK: 17.0000 g | PACK | Freq: Every day | ORAL | Status: DC | PRN

## 2023-08-20 MED ORDER — SODIUM CHLORIDE 0.9 % IV SOLN
2.0000 g | Freq: Once | INTRAVENOUS | Status: AC
  Administered 2023-08-20: 2 g via INTRAVENOUS
  Filled 2023-08-20: qty 20

## 2023-08-20 MED ORDER — LACTATED RINGERS IV SOLN: 150.0000 mL/h | INTRAVENOUS | Status: DC

## 2023-08-20 MED ORDER — LACTATED RINGERS IV SOLN: INTRAVENOUS | Status: AC

## 2023-08-20 MED ORDER — LACTATED RINGERS IV BOLUS (SEPSIS)
500.0000 mL | Freq: Once | INTRAVENOUS | Status: AC
  Administered 2023-08-20: 500 mL via INTRAVENOUS

## 2023-08-20 MED ORDER — ACETAMINOPHEN 325 MG PO TABS
650.0000 mg | ORAL_TABLET | Freq: Four times a day (QID) | ORAL | Status: DC | PRN
  Filled 2023-08-20: qty 2

## 2023-08-20 MED ORDER — FLUTICASONE FUROATE-VILANTEROL 100-25 MCG/ACT IN AEPB
1.0000 | INHALATION_SPRAY | Freq: Every day | RESPIRATORY_TRACT | Status: DC
  Administered 2023-08-21: 1 via RESPIRATORY_TRACT
  Filled 2023-08-20: qty 28

## 2023-08-20 MED ORDER — LACTATED RINGERS IV BOLUS (SEPSIS)
1000.0000 mL | Freq: Once | INTRAVENOUS | Status: AC
  Administered 2023-08-20: 1000 mL via INTRAVENOUS

## 2023-08-20 MED ORDER — IOHEXOL 350 MG/ML SOLN
75.0000 mL | Freq: Once | INTRAVENOUS | Status: AC | PRN
  Administered 2023-08-20: 75 mL via INTRAVENOUS

## 2023-08-20 MED ORDER — BISACODYL 10 MG RE SUPP: 10.0000 mg | Freq: Every day | RECTAL | Status: DC | PRN

## 2023-08-20 MED ORDER — POTASSIUM CHLORIDE CRYS ER 20 MEQ PO TBCR
40.0000 meq | EXTENDED_RELEASE_TABLET | ORAL | Status: AC
  Administered 2023-08-20: 40 meq via ORAL
  Filled 2023-08-20: qty 2

## 2023-08-20 MED ORDER — ENOXAPARIN SODIUM 40 MG/0.4ML IJ SOSY
40.0000 mg | PREFILLED_SYRINGE | Freq: Every day | INTRAMUSCULAR | Status: DC
  Filled 2023-08-20: qty 0.4

## 2023-08-20 MED ORDER — MAGNESIUM CITRATE PO SOLN: 1.0000 | Freq: Once | ORAL | Status: DC | PRN

## 2023-08-20 MED ORDER — ALBUTEROL SULFATE (2.5 MG/3ML) 0.083% IN NEBU: 3.0000 mL | INHALATION_SOLUTION | Freq: Four times a day (QID) | RESPIRATORY_TRACT | Status: DC | PRN

## 2023-08-20 MED ORDER — LACTATED RINGERS IV BOLUS (SEPSIS): 500.0000 mL | Freq: Once | INTRAVENOUS | Status: DC

## 2023-08-20 MED ORDER — SODIUM CHLORIDE 0.9 % IV SOLN: 2.0000 g | Freq: Every day | INTRAVENOUS | Status: DC

## 2023-08-20 NOTE — Assessment & Plan Note (Signed)
 UA is positive with leukocyte esterace and bacteria in urine. Urine culture is pending. Blood cultures x 2 have been obtained. The patient is receiving ceftriaxone 2 gm IV daily.

## 2023-08-20 NOTE — Progress Notes (Addendum)
 Elink monitoring for the code sepsis protocol.  4782 Notified bedside nurse of need to draw blood cultures.  Antibiotic was given before Blood Culture drawn.

## 2023-08-20 NOTE — Assessment & Plan Note (Addendum)
 Pt acknowledges the diagnosis of COPD and states that that is why she coughs so much. Will monitor patient's respiratory status and include PRN nebs for her for wheezing. Will also continue her Breo Ellipta.

## 2023-08-20 NOTE — H&P (Signed)
 History and Physical    Patient: Deanna Klein:[id number] DOB: 1975/09/13 DOA: 08/19/2023 DOS: the patient was seen and examined on 08/20/2023 PCP: [facility]  Patient coming from: Home  Chief Complaint:  Chief Complaint  Patient presents with   Cystitis   HPI: Deanna Klein is a 48 y.o. female with medical history significant of substance abuse, lung abscess, necrotizing pneumonia, COPD, seizure disorder, sepsis, urinary tract infections. She has presented to the [facility] ED on 08/19/2023 with complaints of dysuria, fevers, chills, and suprapubic pain. The patietn denies nausea, vomiting, flank pain,   In the ED she has met 4 SIRS criteria with a UA positive for bacteria and LES. Therefore the patient is being admitted with sepsis. She is receiving a sepsis bolus.  Blood cultures x 2 have been drawn. She has received 2 gm of ceftriaxone.  Despite her co-morbidities the patient states that she takes no medications at home.  She will be admitted to a medical bed. She will receive IV fluids and IV antibiotics. Will also add on pyridium for dysuria.  At the time of my visit the patient is stating her intention to go home from the ED. She has been advised that she would have to leave against medical advise and that she would be putting herself at risk for worsening infection including possible pyelonephritis, endocarditis, sepsis, renal failure, and death should she choose to leave AMA. The patient has voiced understanding. Review of Systems: As mentioned in the history of present illness. All other systems reviewed and are negative. Past Medical History:  Diagnosis Date   Cocaine abuse (HCC)    Depression    Seizures (HCC)    STD (female)    Substance abuse (HCC)    crack cocaine   Past Surgical History:  Procedure Laterality Date   right lung collapse     Social History:  reports that she has been smoking cigarettes. She has a 19 pack-year smoking history. She has never  used smokeless tobacco. She reports current drug use. Drugs: "Crack" cocaine and Cocaine. She reports that she does not drink alcohol.  Allergies  Allergen Reactions   Darvocet [Propoxyphene N-Acetaminophen] Hives   Percocet [Oxycodone-Acetaminophen] Hives    History reviewed. No pertinent family history.  Prior to Admission medications   Medication Sig Start Date End Date Taking? Authorizing Provider  albuterol (VENTOLIN HFA) 108 (90 Base) MCG/ACT inhaler Inhale 1-2 puffs into the lungs every 6 (six) hours as needed for wheezing or shortness of breath. 01/29/23  Yes Monna Fam, MD  budesonide-formoterol [facility]) 80-4.5 MCG/ACT inhaler Inhale 2 puffs into the lungs 2 (two) times daily. 01/29/23  Yes Monna Fam, MD    Physical Exam: Vitals:   08/19/23 1901 08/19/23 1926 08/19/23 2235 08/20/23 0344  BP: 90/79  106/79 100/67  Pulse: (!) 121  (!) 106 (!) 115  Resp: 19  (!) 22 (!) 22  Temp: 99.3 F (37.4 C)  100.2 F (37.9 C) 99.7 F (37.6 C)  TempSrc:   Oral Oral  SpO2: 97%  97% 93%  Weight:  45 kg    Height:  5\' 4"  (1.626 m)     Exam:  Constitutional:  The patient is awake, alert, and oriented x 3. No acute distress. Eyes:  pupils and irises appear normal Normal lids and conjunctivae ENMT:  grossly normal hearing  Lips appear normal external ears, nose appear normal Oropharynx: mucosa, tongue,posterior pharynx appear normal Neck:  neck appears normal, no masses, normal ROM,  supple no thyromegaly Respiratory:  No increased work of breathing. No wheezes, rales, or rhonchi No tactile fremitus Cardiovascular:  Regular rate and rhythm No murmurs, ectopy, or gallups. No lateral PMI. No thrills. Abdomen:  Abdomen is soft, non-tender, non-distended No hernias, masses, or organomegaly Normoactive bowel sounds.  Positive for suprapubic tenderness to palpation. Musculoskeletal:  No cyanosis, clubbing, or edema Skin:  No rashes, lesions, ulcers palpation of skin: no  induration or nodules Neurologic:  CN 2-12 intact Sensation all 4 extremities intact Psychiatric:  Mental status Mood, affect appropriate Orientation to person, place, time  judgment and insight appear intact  Data Reviewed:  CBC BMP UA Assessment and Plan: COPD (chronic obstructive pulmonary disease) (HCC) Pt acknowledges the diagnosis of COPD and states that that is why she coughs so much. Will monitor patient's respiratory status and include PRN nebs for her for wheezing. Will also continue her Breo Ellipta.  Hypokalemia Supplement and monitor.  Polysubstance abuse (HCC) Noted. UDS not run on admission. Will order.  Protein-calorie malnutrition, severe (HCC) Pt with BMI of 17. Consult nutrition.  Sepsis (HCC) The patient meets sepsis criteria due to elevated HR, RR, WBC, fever, and hypotension upon admission. She has a UTI as a source of infection. She is on a sepsis protocol.  UTI (urinary tract infection) UA is positive with leukocyte esterace and bacteria in urine. Urine culture is pending. Blood cultures x 2 have been obtained. The patient is receiving ceftriaxone 2 gm IV daily.       Advance Care Planning:   Code Status: Full Code   Consults: None  Family Communication: Family at bedside  Severity of Illness: The appropriate patient status for this patient is OBSERVATION. Observation status is judged to be reasonable and necessary in order to provide the required intensity of service to ensure the patient's safety. The patient's presenting symptoms, physical exam findings, and initial radiographic and laboratory data in the context of their medical condition is felt to place them at decreased risk for further clinical deterioration. Furthermore, it is anticipated that the patient will be medically stable for discharge from the hospital within 2 midnights of admission.   Author: Lorren Splawn, DO 08/20/2023 8:57 AM  For on call review [email or web address].

## 2023-08-20 NOTE — Assessment & Plan Note (Signed)
 Pt with BMI of 17. Consult nutrition.

## 2023-08-20 NOTE — Assessment & Plan Note (Signed)
 The patient meets sepsis criteria due to elevated HR, RR, WBC, fever, and hypotension upon admission. She has a UTI as a source of infection. She is on a sepsis protocol.

## 2023-08-20 NOTE — Plan of Care (Addendum)
 Patient Refused sacral foam Problem: Fluid Volume: Goal: Hemodynamic stability will improve Outcome: Progressing   Problem: Clinical Measurements: Goal: Diagnostic test results will improve Outcome: Progressing Goal: Signs and symptoms of infection will decrease Outcome: Progressing   Problem: Respiratory: Goal: Ability to maintain adequate ventilation will improve Outcome: Progressing   Problem: Education: Goal: Knowledge of General Education information will improve Description: Including pain rating scale, medication(s)/side effects and non-pharmacologic comfort measures Outcome: Progressing   Problem: Health Behavior/Discharge Planning: Goal: Ability to manage health-related needs will improve Outcome: Progressing   Problem: Clinical Measurements: Goal: Ability to maintain clinical measurements within normal limits will improve Outcome: Progressing Goal: Will remain free from infection Outcome: Progressing Goal: Diagnostic test results will improve Outcome: Progressing Goal: Respiratory complications will improve Outcome: Progressing Goal: Cardiovascular complication will be avoided Outcome: Progressing   Problem: Activity: Goal: Risk for activity intolerance will decrease Outcome: Progressing   Problem: Nutrition: Goal: Adequate nutrition will be maintained Outcome: Progressing   Problem: Coping: Goal: Level of anxiety will decrease Outcome: Progressing   Problem: Elimination: Goal: Will not experience complications related to bowel motility Outcome: Progressing Goal: Will not experience complications related to urinary retention Outcome: Progressing   Problem: Pain Managment: Goal: General experience of comfort will improve and/or be controlled Outcome: Progressing   Problem: Safety: Goal: Ability to remain free from injury will improve Outcome: Progressing   Problem: Skin Integrity: Goal: Risk for impaired skin integrity will decrease Outcome:  Progressing

## 2023-08-20 NOTE — Assessment & Plan Note (Signed)
 Supplement and monitor

## 2023-08-20 NOTE — ED Provider Notes (Signed)
 [name] EMERGENCY DEPARTMENT AT [facility] Provider Note   CSN: [id number] Arrival date & time: 08/19/23  1742     History  Chief Complaint  Patient presents with   Cystitis    Deanna Klein is a 48 y.o. female.  Patient with past medical history significant for substance abuse, lung abscess, necrotizing pneumonia, seizure disorder, sepsis, urinary tract infections presents to the emergency department complaining of 4 to 5 days of "bladder infection".  She reports feeling febrile at home, having difficulty with urination.  She also endorses abdominal pain.  She has frequent episodes of coughing but states this is normal for her due to underlying COPD.  Patient admitted for sepsis earlier last year thought to be related to possible UTI.  At the time of my assessment patient meets SIRS criteria and code sepsis was activated.  HPI     Home Medications Prior to Admission medications   Medication Sig Start Date End Date Taking? Authorizing Provider  albuterol (VENTOLIN HFA) 108 (90 Base) MCG/ACT inhaler Inhale 1-2 puffs into the lungs every 6 (six) hours as needed for wheezing or shortness of breath. 01/29/23   Monna Fam, MD  budesonide-formoterol [facility]) 80-4.5 MCG/ACT inhaler Inhale 2 puffs into the lungs 2 (two) times daily. 01/29/23   Monna Fam, MD  divalproex (DEPAKOTE) 500 MG DR tablet Take 500 mg by mouth once. Pt took this from an old rx. Pt was on the rx over a year ago. Patient not taking: Reported on 01/28/2023    [provider]      Allergies    Darvocet [propoxyphene n-acetaminophen] and Percocet [oxycodone-acetaminophen]    Review of Systems   Review of Systems  Physical Exam Updated Vital Signs BP 100/67 (BP Location: Right Arm)   Pulse (!) 115   Temp 99.7 F (37.6 C) (Oral)   Resp (!) 22   Ht 5\' 4"  (1.626 m)   Wt 45 kg   LMP  (LMP Unknown)   SpO2 93%   BMI 17.03 kg/m  Physical Exam Vitals and nursing note reviewed.   Constitutional:      General: She is not in acute distress.    Appearance: She is well-developed.  HENT:     Head: Normocephalic and atraumatic.  Eyes:     Conjunctiva/sclera: Conjunctivae normal.  Cardiovascular:     Rate and Rhythm: Regular rhythm. Tachycardia present.  Pulmonary:     Effort: Pulmonary effort is normal. No respiratory distress.     Breath sounds: Rhonchi present.  Abdominal:     Palpations: Abdomen is soft.     Tenderness: There is abdominal tenderness.     Comments: Generalized tenderness  Musculoskeletal:        General: No swelling.     Cervical back: Neck supple.  Skin:    General: Skin is warm and dry.     Capillary Refill: Capillary refill takes less than 2 seconds.  Neurological:     Mental Status: She is alert.  Psychiatric:        Mood and Affect: Mood normal.     ED Results / Procedures / Treatments   Labs (all labs ordered are listed, but only abnormal results are displayed) Labs Reviewed  COMPREHENSIVE METABOLIC PANEL - Abnormal; Notable for the following components:      Result Value   Sodium 134 (*)    Potassium 3.3 (*)    CO2 21 (*)    Glucose, Bld 105 (*)    All  other components within normal limits  CBC WITH DIFFERENTIAL/PLATELET - Abnormal; Notable for the following components:   WBC 13.3 (*)    Hemoglobin 15.1 (*)    Neutro Abs 9.8 (*)    Monocytes Absolute 1.4 (*)    All other components within normal limits  URINALYSIS, W/ REFLEX TO CULTURE (INFECTION SUSPECTED) - Abnormal; Notable for the following components:   APPearance CLOUDY (*)    Hgb urine dipstick MODERATE (*)    Protein, ur 100 (*)    Leukocytes,Ua LARGE (*)    Bacteria, UA FEW (*)    All other components within normal limits  URINE CULTURE  CULTURE, BLOOD (SINGLE)  HCG, SERUM, QUALITATIVE  I-STAT CG4 LACTIC ACID, ED    EKG None  Radiology CT ABDOMEN PELVIS W CONTRAST Result Date: 08/20/2023 CLINICAL DATA:  48 year old female with sepsis. EXAM: CT  ABDOMEN AND PELVIS WITH CONTRAST TECHNIQUE: Multidetector CT imaging of the abdomen and pelvis was performed using the standard protocol following bolus administration of intravenous contrast. RADIATION DOSE REDUCTION: This exam was performed according to the departmental dose-optimization program which includes automated exposure control, adjustment of the mA and/or kV according to patient size and/or use of iterative reconstruction technique. CONTRAST:  75mL OMNIPAQUE IOHEXOL 350 MG/ML SOLN COMPARISON:  Chest CT 02/02/2014. FINDINGS: Lower chest: Chronic severe emphysema. Large lung volumes. But no pleural effusion or lung base opacity. Normal heart size. No pericardial effusion. Indistinct distal thoracic esophagus with questionable circumferential wall thickening (series 3, image 2). Hepatobiliary: Diminutive or absent gallbladder. Liver enhancement is within normal limits. Pancreas: Negative. Spleen: Negative. Adrenals/Urinary Tract: Normal adrenal glands. Right kidney is asymmetrically decreased in size, heterogeneous. This appears to reflect areas of chronic right renal cortical scarring. Contralateral left kidney appears hypertrophied (coronal image 32). Occasional small circumscribed renal cysts appear benign (no follow-up imaging recommended). No nephrolithiasis. No convincing hydronephrosis. However, there is mild heterogeneous enhancement of the left kidney, indistinct corticomedullary junction appearance in the upper pole, midpole, lower pole (coronal image 36). But no convincing pararenal inflammation. No delayed excretory phase images. No evidence of hydroureter. Diminutive urinary bladder. Stomach/Bowel: Paucity of visceral fat limits bowel detail in the absence of oral contrast. Moderate volume retained stool throughout the colon. Retained food debris in the stomach. Distal stomach, duodenum, small bowel loops are decompressed. No free air or free fluid identified.  Appendix is not delineated.  Vascular/Lymphatic: Calcified aortic atherosclerosis. Major arterial structures in the abdomen and pelvis are patent. Portal venous system appears patent. No lymphadenopathy is identified. Reproductive: Within normal limits. Other: No pelvis free fluid. Musculoskeletal: Negative. IMPRESSION: 1. Chronically scarred and partially atrophied right kidney. Left renal hypertrophy with subtle superimposed indistinct enhancement of the left kidney such that Acute Left Pyelonephritis is not excluded. Correlation with urinalysis recommended. No associated pararenal inflammation, no strong evidence of obstructive uropathy. 2. Indistinct distal esophagus, Esophagitis not excluded. 3. Paucity of visceral fat limits bowel detail in the absence of oral contrast. No other acute or inflammatory process identified in the abdomen or pelvis. 4. Chronic severe Emphysema (ICD10-J43.9). Electronically Signed   By: Odessa Fleming M.D.   On: 08/20/2023 06:33   DG Chest 2 View Result Date: 08/20/2023 CLINICAL DATA:  Cough and sepsis.  Fevers. EXAM: CHEST - 2 VIEW COMPARISON:  01/28/2023 FINDINGS: Heart size is normal. Lungs are hyperinflated. Diffuse chronic coarsened interstitial markings are identified bilaterally compatible with emphysema. No pleural effusion. New nodular opacity within the posterior left upper lobe is identified measuring 2.6 x  1.2 cm. Surrounding architectural distortion noted. No lobar consolidation. No signs of pneumothorax. Visualized osseous structures are unremarkable. IMPRESSION: 1. New nodular opacity within the posterior left upper lobe measuring 2.6 x 1.2 cm. This may be postinflammatory or infectious in etiology. Underlying malignancy not excluded. Consider more definitive characterization with CT of the chest. 2. Emphysema. Electronically Signed   By: Signa Kell M.D.   On: 08/20/2023 06:16    Procedures .Critical Care  Performed by: Darrick Grinder, PA-C Authorized by: Darrick Grinder, PA-C    Critical care provider statement:    Critical care time (minutes):  30   Critical care time was exclusive of:  Separately billable procedures and treating other patients   Critical care was necessary to treat or prevent imminent or life-threatening deterioration of the following conditions:  Sepsis   Critical care was time spent personally by me on the following activities:  Development of treatment plan with patient or surrogate, discussions with consultants, evaluation of patient's response to treatment, examination of patient, ordering and review of laboratory studies, ordering and review of radiographic studies, ordering and performing treatments and interventions, pulse oximetry, re-evaluation of patient's condition and review of old charts     Medications Ordered in ED Medications  lactated ringers infusion (has no administration in time range)  lactated ringers bolus 1,000 mL (1,000 mLs Intravenous New Bag/Given 08/20/23 0557)    And  lactated ringers bolus 500 mL (has no administration in time range)  cefTRIAXone (ROCEPHIN) 2 g in sodium chloride 0.9 % 100 mL IVPB (2 g Intravenous New Bag/Given 08/20/23 0556)  iohexol (OMNIPAQUE) 350 MG/ML injection 75 mL (75 mLs Intravenous Contrast Given 08/20/23 0615)    ED Course/ Medical Decision Making/ A&P                                 Medical Decision Making Amount and/or Complexity of Data Reviewed Labs: ordered. Radiology: ordered.  Risk Prescription drug management.   This patient presents to the ED for concern of dysuria, this involves an extensive number of treatment options, and is a complaint that carries with it a high risk of complications and morbidity.  The differential diagnosis includes urosepsis, pyelonephritis, others   Co morbidities that complicate the patient evaluation  History of necrotizing lung infection, UTIs, sepsis   Additional history obtained:  Additional history obtained from visitor at  bedside External records from outside source obtained and reviewed including discharge summary from previous hospitalization   Lab Tests:  I Ordered, and personally interpreted labs.  The pertinent results include: Leukocytosis with a white count of 13,300, UA with moderate hemoglobin, protein, large leukocytes, greater than 50 WBC, few bacteria; negative pregnancy test   Imaging Studies ordered:  I ordered imaging studies including CT abdomen pelvis with contrast and chest x-ray I independently visualized and interpreted imaging which showed  1. Chronically scarred and partially atrophied right kidney. Left  renal hypertrophy with subtle superimposed indistinct enhancement of  the left kidney such that Acute Left Pyelonephritis is not excluded.  Correlation with urinalysis recommended. No associated pararenal  inflammation, no strong evidence of obstructive uropathy.  2. Indistinct distal esophagus, Esophagitis not excluded.  3. Paucity of visceral fat limits bowel detail in the absence of  oral contrast. No other acute or inflammatory process identified in  the abdomen or pelvis.  4. Chronic severe Emphysema   1. New nodular opacity within the posterior  left upper lobe  measuring 2.6 x 1.2 cm. This may be postinflammatory or infectious  in etiology. Underlying malignancy not excluded. Consider more  definitive characterization with CT of the chest.  2. Emphysema.   I agree with the radiologist interpretation   Problem List / ED Course / Critical interventions / Medication management   I ordered medication including Rocephin for cystitis coverage, lactated Ringer's bolus for sepsis Reevaluation of the patient after these medicines showed that the patient stayed the same I have reviewed the patients home medicines and have made adjustments as needed   Social Determinants of Health:  Patient has housing insecurity, daily tobacco smoker   Test / Admission -  Considered:  Patient meeting sepsis criteria with leukocytosis and tachycardia.  Patient also tachypneic.  Source appears to be urine at this time.  Patient care being transferred to Evlyn Kanner, PA-C at shift handoff.  Plan for admission once imaging results to rule out other sources of infection.         Final Clinical Impression(s) / ED Diagnoses Final diagnoses:  Sepsis, due to unspecified organism, unspecified whether acute organ dysfunction present [facility])  Pyelonephritis    Rx / DC Orders ED Discharge Orders     None         Pamala Duffel 08/20/23 1610    Nira Conn, MD 08/20/23 [phone]

## 2023-08-20 NOTE — ED Provider Notes (Signed)
 Patient given in sign out by [place], PA-C.  Please review their note for patient HPI, physical exam, workup.  At this time the plan is admit for suspected urosepsis.  I went to valuate the patient the bedside patient does appear ill.  CT scan does show pyelonephritis which would correlate patient's urosepsis.  Sepsis protocol was already initiated and so we will consult hospitalist.  I spoke to the hospitalist and patient was accepted for admission.  Patient stable to be admitted.  ConsultsGerri Lins, MD Hospitalist    Netta Corrigan, [place] 08/20/23 0741    Nira Conn, MD 08/28/23 [phone]

## 2023-08-20 NOTE — Assessment & Plan Note (Signed)
 Noted. UDS not run on admission. Will order.

## 2023-08-21 ENCOUNTER — Other Ambulatory Visit (HOSPITAL_COMMUNITY): Payer: Self-pay

## 2023-08-21 DIAGNOSIS — B9629 Other Escherichia coli [E. coli] as the cause of diseases classified elsewhere: Secondary | ICD-10-CM | POA: Diagnosis not present

## 2023-08-21 DIAGNOSIS — Z1612 Extended spectrum beta lactamase (ESBL) resistance: Secondary | ICD-10-CM | POA: Diagnosis not present

## 2023-08-21 DIAGNOSIS — N390 Urinary tract infection, site not specified: Secondary | ICD-10-CM | POA: Insufficient documentation

## 2023-08-21 DIAGNOSIS — A419 Sepsis, unspecified organism: Secondary | ICD-10-CM | POA: Diagnosis not present

## 2023-08-21 LAB — BLOOD CULTURE ID PANEL (REFLEXED) - BCID2

## 2023-08-21 LAB — CORTISOL-AM, BLOOD: Cortisol - AM: 11.4 ug/dL (ref 6.7–22.6)

## 2023-08-21 LAB — BASIC METABOLIC PANEL
Anion gap: 6 (ref 5–15)
BUN: 11 mg/dL (ref 6–20)
CO2: 23 mmol/L (ref 22–32)
Calcium: 8.3 mg/dL — ABNORMAL LOW (ref 8.9–10.3)
Chloride: 108 mmol/L (ref 98–111)
Creatinine, Ser: 0.6 mg/dL (ref 0.44–1.00)
GFR, Estimated: >60 mL/min (ref >60)
Glucose, Bld: 94 mg/dL (ref 70–99)
Potassium: 3.8 mmol/L (ref 3.5–5.1)
Sodium: 137 mmol/L (ref 135–145)

## 2023-08-21 LAB — CBC
HCT: 37.6 % (ref 36.0–46.0)
Hemoglobin: 12.9 g/dL (ref 12.0–15.0)
MCH: 30.6 pg (ref 26.0–34.0)
MCHC: 34.3 g/dL (ref 30.0–36.0)
MCV: 89.1 fL (ref 80.0–100.0)
Platelets: 162 10*3/uL (ref 150–400)
RBC: 4.22 MIL/uL (ref 3.87–5.11)
RDW: 12.8 % (ref 11.5–15.5)
WBC: 8 10*3/uL (ref 4.0–10.5)
nRBC: 0 % (ref 0.0–0.2)

## 2023-08-21 LAB — URINE CULTURE: Culture: >=100000 — AB

## 2023-08-21 LAB — PROTIME-INR
INR: 1.1 (ref 0.8–1.2)
Prothrombin Time: 14.4 s (ref 11.4–15.2)

## 2023-08-21 MED ORDER — SODIUM CHLORIDE 0.9 % IV SOLN
1.0000 g | Freq: Two times a day (BID) | INTRAVENOUS | Status: DC
  Administered 2023-08-21: 1 g via INTRAVENOUS
  Filled 2023-08-21: qty 20

## 2023-08-21 MED ORDER — SODIUM CHLORIDE 0.9 % IV SOLN
1.0000 g | Freq: Three times a day (TID) | INTRAVENOUS | Status: DC
  Filled 2023-08-21: qty 20

## 2023-08-21 NOTE — Progress Notes (Signed)
 PHARMACY NOTE:  ANTIMICROBIAL RENAL DOSAGE ADJUSTMENT  Current antimicrobial regimen includes a mismatch between antimicrobial dosage and estimated renal function.  As per policy approved by the Pharmacy & Therapeutics and Medical Executive Committees, the antimicrobial dosage will be adjusted accordingly.  Current antimicrobial dosage:  meropenem 1g IV q12h  Indication: Bacteremia due to ESBL Ecoli  Renal Function:  Estimated Creatinine Clearance: 54.4 mL/min (by C-G formula based on SCr of 0.6 mg/dL).     Antimicrobial dosage has been changed to:  meropenem 1g IV q8h  Thank you for allowing pharmacy to be a part of this patient's care.  Mickeal Skinner, [facility] 08/21/2023 8:22 AM

## 2023-08-21 NOTE — Hospital Course (Signed)
 Deanna Klein is a 48 y.o. female with a history of COPD, polysubstance abuse, seizure disorder.  Patient presented secondary to dysuria, fever, chills and suprapubic pain and found to have evidence of sepsis secondary to UTI. Blood and urine cultures obtained and antibiotics started. Blood cultures resulted positive with BCID identifying ESBL E. Coli. Ceftriaxone was transitioned to meropenem. Patient informed nursing that she wanted to leave against medical advice. Severity of illness was discussed with the patient in depth and risks also discussed. Nursing was asked to be present during the discussion.Marland Kitchen

## 2023-08-21 NOTE — Plan of Care (Signed)

## 2023-08-21 NOTE — Progress Notes (Signed)
 PHARMACY - PHYSICIAN COMMUNICATION CRITICAL VALUE ALERT - BLOOD CULTURE IDENTIFICATION (BCID)  Deanna Klein is an 48 y.o. female who presented to [facility] on 08/19/2023 with a chief complaint of dysuria.  Assessment:  Admitted for urosepsis and started on ceftriaxone, UA gram stain shows GNR, now w/ blood cx growing ESBL E.coli in 1 of 4 bottles, presume that UCx will also grow ESBL E.coli.  Name of physician (or Provider) Contacted: JSegars MD  Current antibiotics: ceftriaxone  Changes to prescribed antibiotics recommended:  Recommendations accepted by provider -- change to meropenem 1g IV Q12H (adjusted for renal function).  Results for orders placed or performed during the hospital encounter of 08/19/23  Blood Culture ID Panel (Reflexed) (Collected: 08/20/2023  6:53 AM)  Result Value Ref Range   Enterococcus faecalis NOT DETECTED NOT DETECTED   Enterococcus Faecium NOT DETECTED NOT DETECTED   Listeria monocytogenes NOT DETECTED NOT DETECTED   Staphylococcus species NOT DETECTED NOT DETECTED   Staphylococcus aureus (BCID) NOT DETECTED NOT DETECTED   Staphylococcus epidermidis NOT DETECTED NOT DETECTED   Staphylococcus lugdunensis NOT DETECTED NOT DETECTED   Streptococcus species NOT DETECTED NOT DETECTED   Streptococcus agalactiae NOT DETECTED NOT DETECTED   Streptococcus pneumoniae NOT DETECTED NOT DETECTED   Streptococcus pyogenes NOT DETECTED NOT DETECTED   A.calcoaceticus-baumannii NOT DETECTED NOT DETECTED   Bacteroides fragilis NOT DETECTED NOT DETECTED   Enterobacterales DETECTED (A) NOT DETECTED   Enterobacter cloacae complex NOT DETECTED NOT DETECTED   Escherichia coli DETECTED (A) NOT DETECTED   Klebsiella aerogenes NOT DETECTED NOT DETECTED   Klebsiella oxytoca NOT DETECTED NOT DETECTED   Klebsiella pneumoniae NOT DETECTED NOT DETECTED   Proteus species NOT DETECTED NOT DETECTED   Salmonella species NOT DETECTED NOT DETECTED   Serratia marcescens NOT DETECTED  NOT DETECTED   Haemophilus influenzae NOT DETECTED NOT DETECTED   Neisseria meningitidis NOT DETECTED NOT DETECTED   Pseudomonas aeruginosa NOT DETECTED NOT DETECTED   Stenotrophomonas maltophilia NOT DETECTED NOT DETECTED   Candida albicans NOT DETECTED NOT DETECTED   Candida auris NOT DETECTED NOT DETECTED   Candida glabrata NOT DETECTED NOT DETECTED   Candida krusei NOT DETECTED NOT DETECTED   Candida parapsilosis NOT DETECTED NOT DETECTED   Candida tropicalis NOT DETECTED NOT DETECTED   Cryptococcus neoformans/gattii NOT DETECTED NOT DETECTED   CTX-M ESBL DETECTED (A) NOT DETECTED   Carbapenem resistance IMP NOT DETECTED NOT DETECTED   Carbapenem resistance KPC NOT DETECTED NOT DETECTED   Carbapenem resistance NDM NOT DETECTED NOT DETECTED   Carbapenem resist OXA 48 LIKE NOT DETECTED NOT DETECTED   Carbapenem resistance VIM NOT DETECTED NOT DETECTED    Vernard Gambles, PharmD, BCPS  08/21/2023  4:09 AM

## 2023-08-21 NOTE — Progress Notes (Signed)
 Pt. Requesting to sign out AMA, pt. Educated about potential risks of signing out AMA, pt. Verbalized understanding and is still insisting on leaving, MD in to talk to pt. As well and pt. Wants to proceed with leaving AMA, AMA paperwork signed, pt. Left in stable condition with all belongings  Margarita Grizzle

## 2023-08-21 NOTE — Discharge Summary (Signed)
 Physician Discharge Summary Patient left AGAINST MEDICAL ADVICE   Patient: Deanna Klein MRN: [id number] DOB: [birth year]  Admit date:     08/19/2023  Discharge date: 08/21/2023  Discharge Physician: Jacquelin Hawking, MD   [facility]   Recommendations at discharge:  Return to the emergency department immediately for continued antibiotic therapy  Discharge Diagnoses: Principal Problem:   Sepsis [facility]) Active Problems:   Protein-calorie malnutrition, severe (HCC)   COPD (chronic obstructive pulmonary disease) (HCC)   Polysubstance abuse (HCC)   UTI (urinary tract infection)   Hypokalemia  Resolved Problems:   * No resolved hospital problems. *  Hospital Course: Deanna Klein is a 48 y.o. female with a history of COPD, polysubstance abuse, seizure disorder.  Patient presented secondary to dysuria, fever, chills and suprapubic pain and found to have evidence of sepsis secondary to UTI. Blood and urine cultures obtained and antibiotics started. Blood cultures resulted positive with BCID identifying ESBL E. Coli. Ceftriaxone was transitioned to meropenem. Patient informed nursing that she wanted to leave against medical advice. Severity of illness was discussed with the patient in depth and risks also discussed. Nursing was asked to be present during the discussion..  Assessment and Plan:  Sepsis Present on admission. Secondary to E. Coli bacteremia and UTI. Patient started on empiric antibiotics on admission.   ESBL E. Coli bacteremia GNR UTI Present on admission. Patient started empirically on Ceftriaxone and transitioned to meropenem for ESBL species.    COPD Patient is on Symbicort and Albuterol as an outpatient.   Hypokalemia Mild with potassium of 3.3  on admission. Supplementation given. Resolved.   Polysubstance abuse Noted. UDS ordered and pending.   Severe malnutrition Underweight Noted documentation. Dietitian ordered.   Pressure injury Sacrum.  Present on admission.   Consultants: None Procedures performed: None  Disposition: LEFT AGAINST MEDICAL ADVICE   DISCHARGE MEDICATION: Allergies as of 08/21/2023       Reactions   Darvocet [propoxyphene N-acetaminophen] Hives   Percocet [oxycodone-acetaminophen] Hives        Medication List     ASK your doctor about these medications    Symbicort 80-4.5 MCG/ACT inhaler Generic drug: budesonide-formoterol Inhale 2 puffs into the lungs 2 (two) times daily.   Ventolin HFA 108 (90 Base) MCG/ACT inhaler Generic drug: albuterol Inhale 1-2 puffs into the lungs every 6 (six) hours as needed for wheezing or shortness of breath.        Discharge Exam:  General exam: Appears slightly anxious, but otherwise comfortable  Condition at discharge: serious  The results of significant diagnostics from this hospitalization (including imaging, microbiology, ancillary and laboratory) are listed below for reference.   Imaging Studies: CT ABDOMEN PELVIS W CONTRAST Result Date: 08/20/2023 CLINICAL DATA:  48 year old female with sepsis. EXAM: CT ABDOMEN AND PELVIS WITH CONTRAST TECHNIQUE: Multidetector CT imaging of the abdomen and pelvis was performed using the standard protocol following bolus administration of intravenous contrast. RADIATION DOSE REDUCTION: This exam was performed according to the departmental dose-optimization program which includes automated exposure control, adjustment of the mA and/or kV according to patient size and/or use of iterative reconstruction technique. CONTRAST:  75mL OMNIPAQUE IOHEXOL 350 MG/ML SOLN COMPARISON:  Chest CT 02/02/2014. FINDINGS: Lower chest: Chronic severe emphysema. Large lung volumes. But no pleural effusion or lung base opacity. Normal heart size. No pericardial effusion. Indistinct distal thoracic esophagus with questionable circumferential wall thickening (series 3, image 2). Hepatobiliary: Diminutive or absent gallbladder. Liver enhancement is  within normal limits. Pancreas:  Negative. Spleen: Negative. Adrenals/Urinary Tract: Normal adrenal glands. Right kidney is asymmetrically decreased in size, heterogeneous. This appears to reflect areas of chronic right renal cortical scarring. Contralateral left kidney appears hypertrophied (coronal image 32). Occasional small circumscribed renal cysts appear benign (no follow-up imaging recommended). No nephrolithiasis. No convincing hydronephrosis. However, there is mild heterogeneous enhancement of the left kidney, indistinct corticomedullary junction appearance in the upper pole, midpole, lower pole (coronal image 36). But no convincing pararenal inflammation. No delayed excretory phase images. No evidence of hydroureter. Diminutive urinary bladder. Stomach/Bowel: Paucity of visceral fat limits bowel detail in the absence of oral contrast. Moderate volume retained stool throughout the colon. Retained food debris in the stomach. Distal stomach, duodenum, small bowel loops are decompressed. No free air or free fluid identified.  Appendix is not delineated. Vascular/Lymphatic: Calcified aortic atherosclerosis. Major arterial structures in the abdomen and pelvis are patent. Portal venous system appears patent. No lymphadenopathy is identified. Reproductive: Within normal limits. Other: No pelvis free fluid. Musculoskeletal: Negative. IMPRESSION: 1. Chronically scarred and partially atrophied right kidney. Left renal hypertrophy with subtle superimposed indistinct enhancement of the left kidney such that Acute Left Pyelonephritis is not excluded. Correlation with urinalysis recommended. No associated pararenal inflammation, no strong evidence of obstructive uropathy. 2. Indistinct distal esophagus, Esophagitis not excluded. 3. Paucity of visceral fat limits bowel detail in the absence of oral contrast. No other acute or inflammatory process identified in the abdomen or pelvis. 4. Chronic severe Emphysema  (ICD10-J43.9). Electronically Signed   By: Odessa Fleming M.D.   On: 08/20/2023 06:33   DG Chest 2 View Result Date: 08/20/2023 CLINICAL DATA:  Cough and sepsis.  Fevers. EXAM: CHEST - 2 VIEW COMPARISON:  01/28/2023 FINDINGS: Heart size is normal. Lungs are hyperinflated. Diffuse chronic coarsened interstitial markings are identified bilaterally compatible with emphysema. No pleural effusion. New nodular opacity within the posterior left upper lobe is identified measuring 2.6 x 1.2 cm. Surrounding architectural distortion noted. No lobar consolidation. No signs of pneumothorax. Visualized osseous structures are unremarkable. IMPRESSION: 1. New nodular opacity within the posterior left upper lobe measuring 2.6 x 1.2 cm. This may be postinflammatory or infectious in etiology. Underlying malignancy not excluded. Consider more definitive characterization with CT of the chest. 2. Emphysema. Electronically Signed   By: Signa Kell M.D.   On: 08/20/2023 06:16    Microbiology: Results for orders placed or performed during the hospital encounter of 08/19/23  Urine Culture     Status: Abnormal   Collection Time: 08/19/23  9:12 PM   Specimen: Urine, Random  Result Value Ref Range Status   Specimen Description URINE, RANDOM  Final   Special Requests   Final    NONE Reflexed from [id number] Performed at [facility], [address], [place], [place] [address]    Culture (A)  Final    >=100,000 COLONIES/mL ESCHERICHIA COLI Confirmed Extended Spectrum Beta-Lactamase Producer (ESBL).  In bloodstream infections from ESBL organisms, carbapenems are preferred over piperacillin/tazobactam. They are shown to have a lower risk of mortality.    Report Status 08/21/2023 FINAL  Final   Organism ID, Bacteria ESCHERICHIA COLI (A)  Final      Susceptibility   Escherichia coli - MIC*    AMPICILLIN >=32 RESISTANT Resistant     CEFAZOLIN >=64 RESISTANT Resistant     CEFEPIME 8 INTERMEDIATE Intermediate     CEFTRIAXONE  >=64 RESISTANT Resistant     CIPROFLOXACIN >=4 RESISTANT Resistant     GENTAMICIN >=16 RESISTANT  Resistant     IMIPENEM <=0.25 SENSITIVE Sensitive     NITROFURANTOIN 128 RESISTANT Resistant     TRIMETH/SULFA <=20 SENSITIVE Sensitive     AMPICILLIN/SULBACTAM >=32 RESISTANT Resistant     PIP/TAZO >=128 RESISTANT Resistant ug/mL    * >=100,000 COLONIES/mL ESCHERICHIA COLI  Culture, blood (single)     Status: None (Preliminary result)   Collection Time: 08/20/23  6:53 AM   Specimen: BLOOD RIGHT HAND  Result Value Ref Range Status   Specimen Description BLOOD RIGHT HAND  Final   Special Requests   Final    BOTTLES DRAWN AEROBIC AND ANAEROBIC Blood Culture results may not be optimal due to an inadequate volume of blood received in culture bottles   Culture  Setup Time   Final    GRAM NEGATIVE RODS ANAEROBIC BOTTLE ONLY Organism ID to follow CRITICAL RESULT CALLED TO, READ BACK BY AND VERIFIED WITH: V BRYK,PHARMD@[id number]  08/21/23 MK Performed at [facility], [address], [place], [place] [address]    Culture GRAM NEGATIVE RODS  Final   Report Status PENDING  Incomplete  Blood Culture ID Panel (Reflexed)     Status: Abnormal   Collection Time: 08/20/23  6:53 AM  Result Value Ref Range Status   Enterococcus faecalis NOT DETECTED NOT DETECTED Final   Enterococcus Faecium NOT DETECTED NOT DETECTED Final   Listeria monocytogenes NOT DETECTED NOT DETECTED Final   Staphylococcus species NOT DETECTED NOT DETECTED Final   Staphylococcus aureus (BCID) NOT DETECTED NOT DETECTED Final   Staphylococcus epidermidis NOT DETECTED NOT DETECTED Final   Staphylococcus lugdunensis NOT DETECTED NOT DETECTED Final   Streptococcus species NOT DETECTED NOT DETECTED Final   Streptococcus agalactiae NOT DETECTED NOT DETECTED Final   Streptococcus pneumoniae NOT DETECTED NOT DETECTED Final   Streptococcus pyogenes NOT DETECTED NOT DETECTED Final   A.calcoaceticus-baumannii NOT DETECTED NOT DETECTED Final    Bacteroides fragilis NOT DETECTED NOT DETECTED Final   Enterobacterales DETECTED (A) NOT DETECTED Final    Comment: Enterobacterales represent a large order of gram negative bacteria, not a single organism. CRITICAL RESULT CALLED TO, READ BACK BY AND VERIFIED WITH: V BRYK,PHARMD@[id number]  08/21/23 MK    Enterobacter cloacae complex NOT DETECTED NOT DETECTED Final   Escherichia coli DETECTED (A) NOT DETECTED Final    Comment: CRITICAL RESULT CALLED TO, READ BACK BY AND VERIFIED WITH: V BRYK,PHARMD@0340  08/21/23 MK    Klebsiella aerogenes NOT DETECTED NOT DETECTED Final   Klebsiella oxytoca NOT DETECTED NOT DETECTED Final   Klebsiella pneumoniae NOT DETECTED NOT DETECTED Final   Proteus species NOT DETECTED NOT DETECTED Final   Salmonella species NOT DETECTED NOT DETECTED Final   Serratia marcescens NOT DETECTED NOT DETECTED Final   Haemophilus influenzae NOT DETECTED NOT DETECTED Final   Neisseria meningitidis NOT DETECTED NOT DETECTED Final   Pseudomonas aeruginosa NOT DETECTED NOT DETECTED Final   Stenotrophomonas maltophilia NOT DETECTED NOT DETECTED Final   Candida albicans NOT DETECTED NOT DETECTED Final   Candida auris NOT DETECTED NOT DETECTED Final   Candida glabrata NOT DETECTED NOT DETECTED Final   Candida krusei NOT DETECTED NOT DETECTED Final   Candida parapsilosis NOT DETECTED NOT DETECTED Final   Candida tropicalis NOT DETECTED NOT DETECTED Final   Cryptococcus neoformans/gattii NOT DETECTED NOT DETECTED Final   CTX-M ESBL DETECTED (A) NOT DETECTED Final    Comment: CRITICAL RESULT CALLED TO, READ BACK BY AND VERIFIED WITH: V BRYK,PHARMD@0340  08/21/23 MK (NOTE) Extended spectrum beta-lactamase detected. Recommend a carbapenem as  initial therapy.      Carbapenem resistance IMP NOT DETECTED NOT DETECTED Final   Carbapenem resistance KPC NOT DETECTED NOT DETECTED Final   Carbapenem resistance NDM NOT DETECTED NOT DETECTED Final   Carbapenem resist OXA 48 LIKE NOT  DETECTED NOT DETECTED Final   Carbapenem resistance VIM NOT DETECTED NOT DETECTED Final    Comment: Performed at [facility], [address], [place], [place] [address]  Culture, blood (single) w Reflex to ID Panel     Status: None (Preliminary result)   Collection Time: 08/20/23  9:52 AM   Specimen: BLOOD RIGHT HAND  Result Value Ref Range Status   Specimen Description BLOOD RIGHT HAND  Final   Special Requests   Final    BOTTLES DRAWN AEROBIC AND ANAEROBIC Blood Culture results may not be optimal due to an inadequate volume of blood received in culture bottles   Culture   Final    NO GROWTH < 24 HOURS Performed at [facility], [address] [place], [place] [address]    Report Status PENDING  Incomplete    Labs: CBC: Recent Labs  Lab 08/19/23 2015 08/20/23 0952 08/21/23 0601  WBC 13.3* 11.1* 8.0  NEUTROABS 9.8*  --   --   HGB 15.1* 13.7 12.9  HCT 43.8 40.7 37.6  MCV 89.4 90.4 89.1  PLT 178 168 162   Basic Metabolic Panel: Recent Labs  Lab 08/19/23 2015 08/20/23 0952 08/21/23 0601  NA 134*  --  137  K 3.3*  --  3.8  CL 99  --  108  CO2 21*  --  23  GLUCOSE 105*  --  94  BUN 13  --  11  CREATININE 0.92 0.97 0.60  CALCIUM 9.3  --  8.3*   Liver Function Tests: Recent Labs  Lab 08/19/23 2015  AST 15  ALT 11  ALKPHOS 48  BILITOT 0.9  PROT 7.0  ALBUMIN 3.5    Discharge time spent: 35 minutes.  Signed: Jacquelin Hawking, MD [facility] 08/21/2023

## 2023-08-23 LAB — CULTURE, BLOOD (SINGLE)

## 2023-08-25 LAB — CULTURE, BLOOD (SINGLE): Culture: NO GROWTH

## 2024-01-28 IMAGING — DX DG CHEST 2V
2 series · 2 of 2 positions shown · non-contrast
Comparison: Radiograph 09/09/2017

CLINICAL DATA: left chest pain for years

EXAM:
CHEST - 2 VIEW

[chest pa]
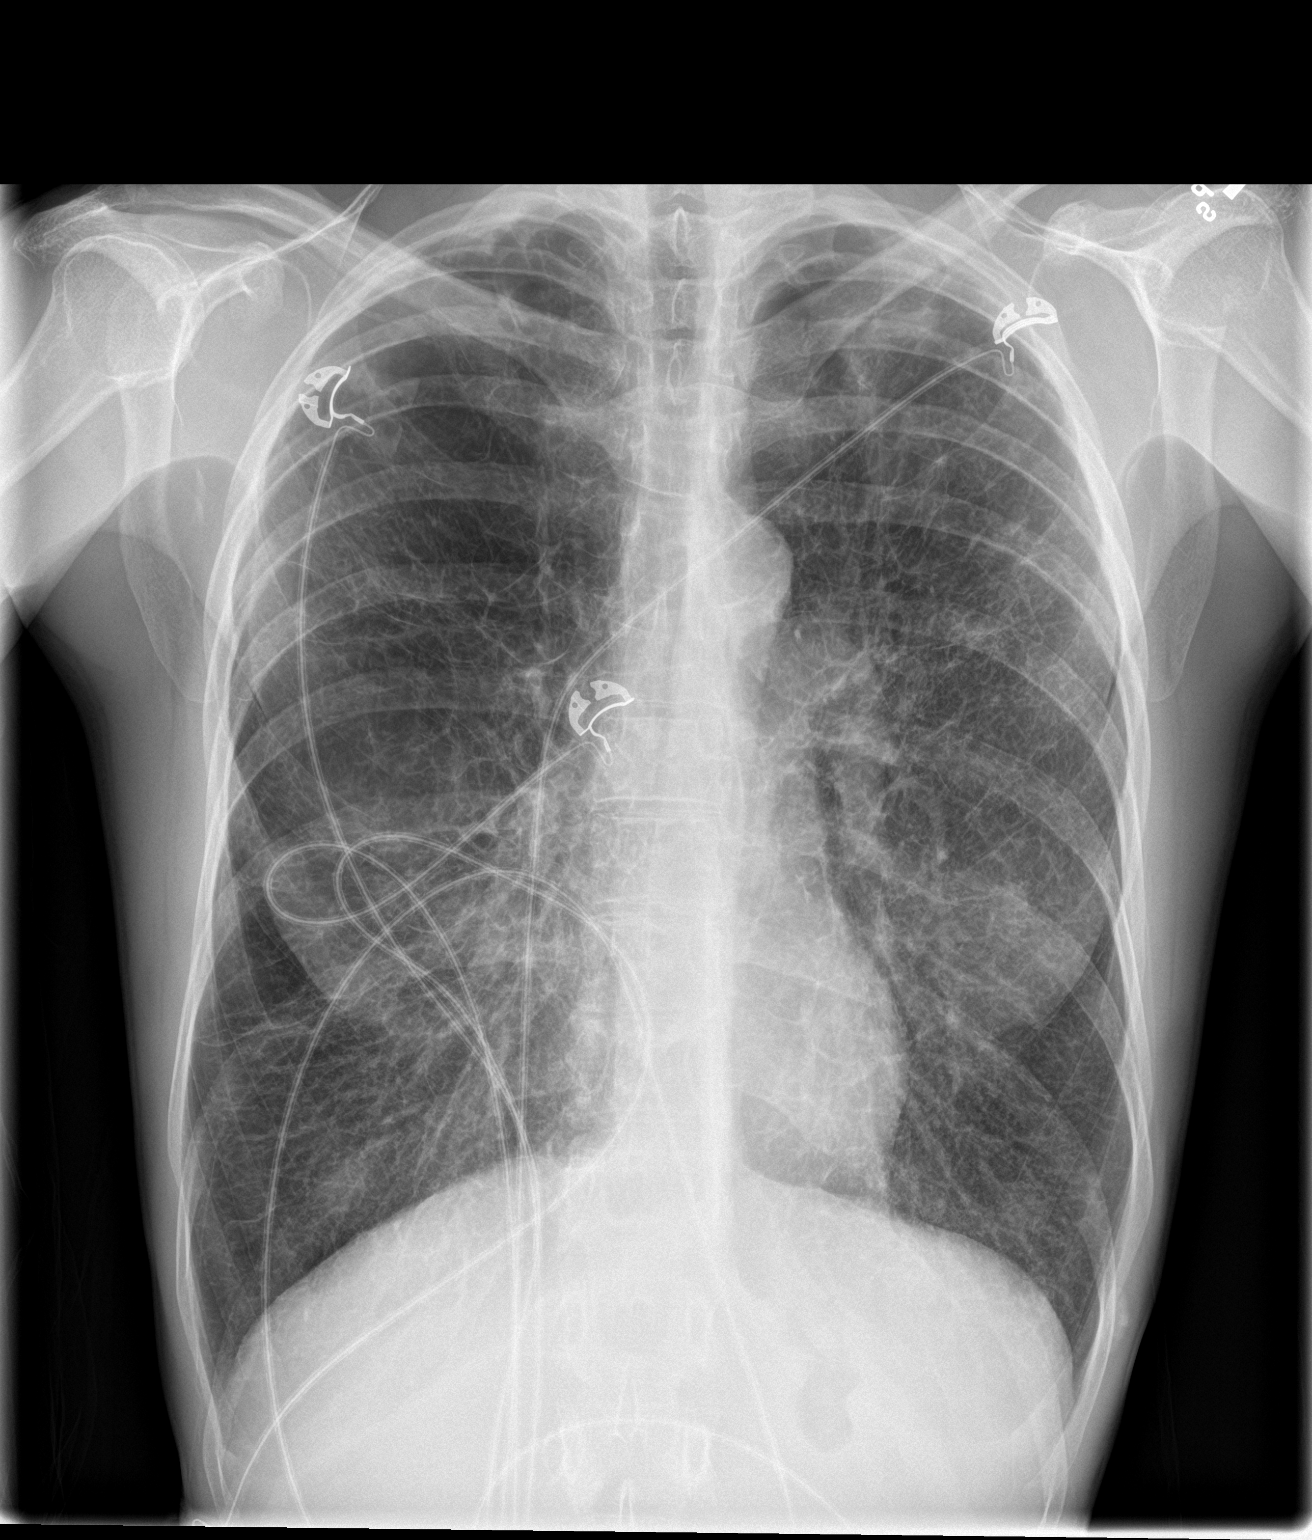

[chest lat]
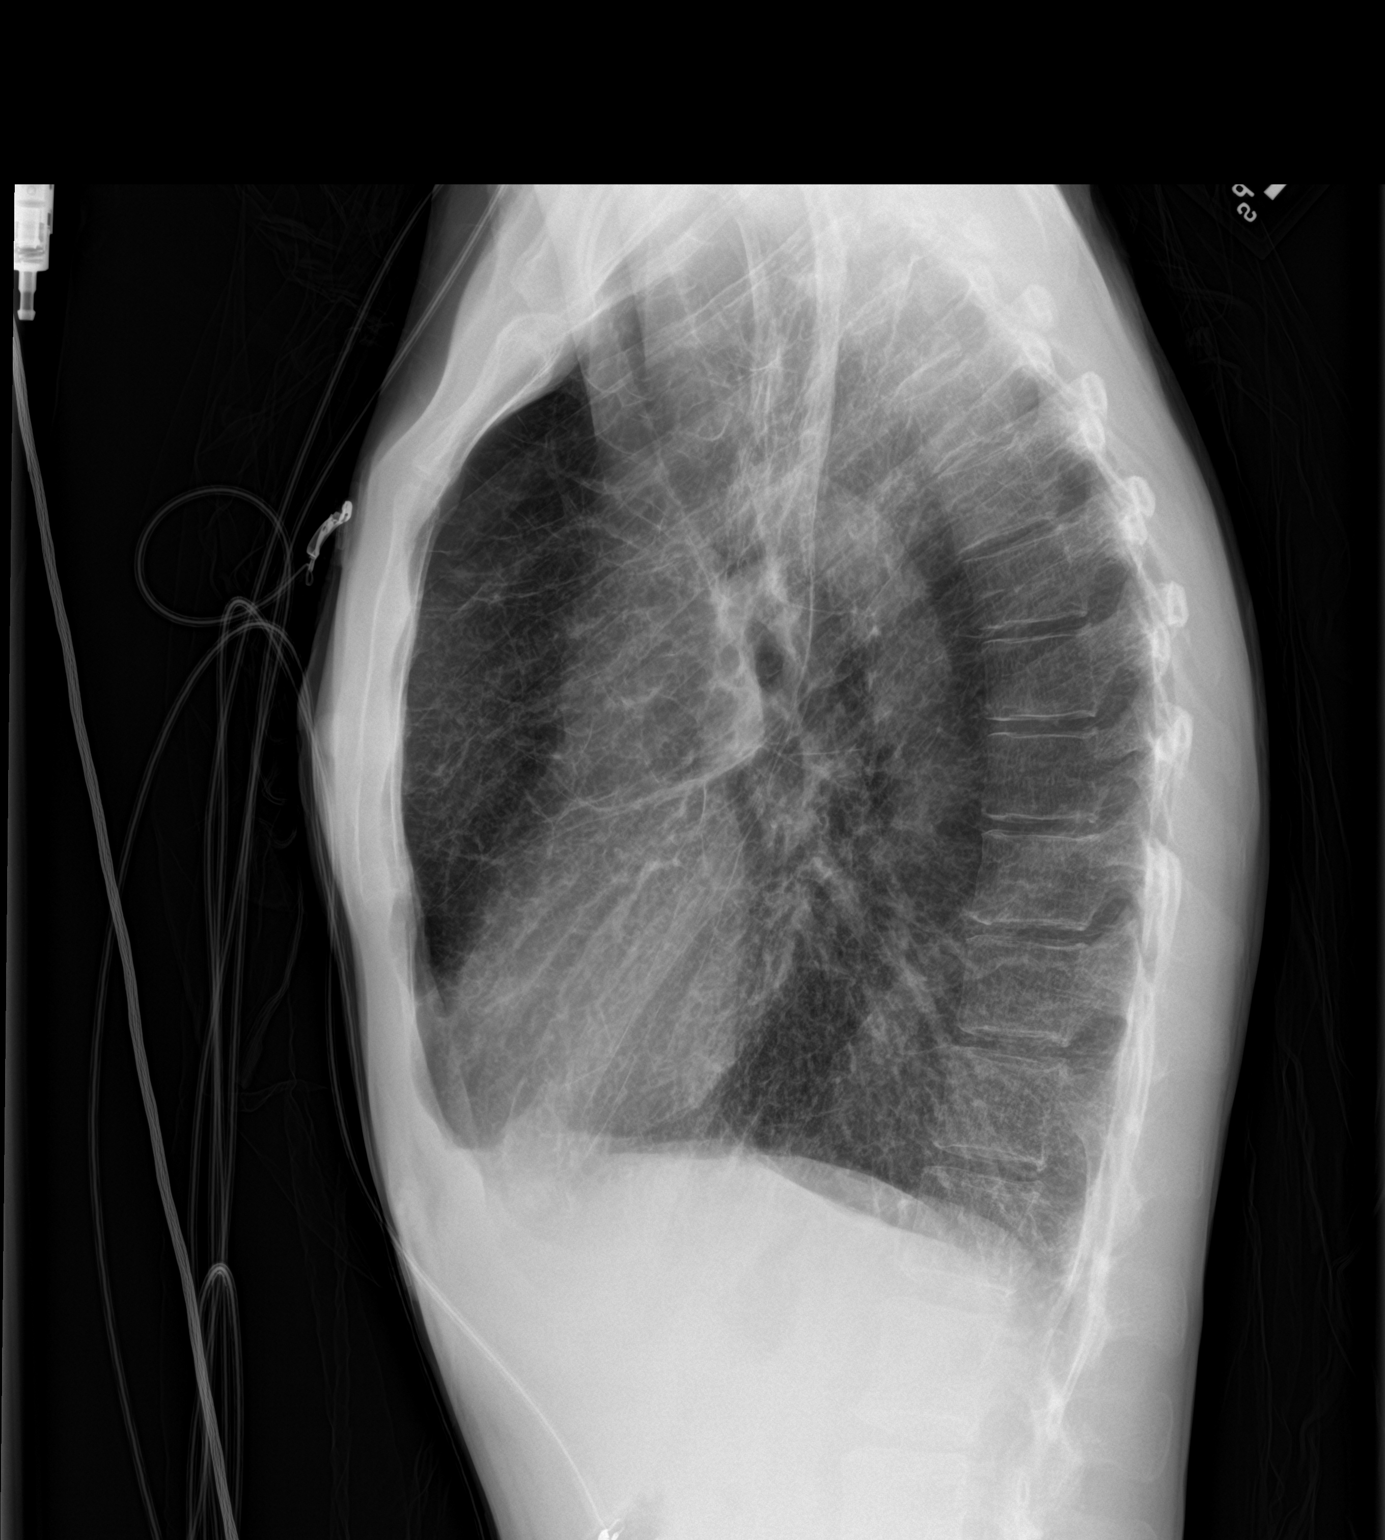

[2 of 2 positions shown; findings below may reference images not displayed]

FINDINGS: Unchanged cardiomediastinal silhouette. Unchanged emphysema with
chronic lung scarring and interstitial prominence. There is no new
airspace disease. No large pleural effusion. No pneumothorax. No
acute osseous abnormality.
IMPRESSION: Unchanged severe emphysema.  No new airspace disease.

## 2024-01-31 ENCOUNTER — Other Ambulatory Visit: Payer: Self-pay

## 2024-01-31 DIAGNOSIS — J439 Emphysema, unspecified: Secondary | ICD-10-CM | POA: Diagnosis present

## 2024-01-31 DIAGNOSIS — Z885 Allergy status to narcotic agent status: Secondary | ICD-10-CM

## 2024-01-31 DIAGNOSIS — F17210 Nicotine dependence, cigarettes, uncomplicated: Secondary | ICD-10-CM | POA: Diagnosis present

## 2024-01-31 DIAGNOSIS — Z7951 Long term (current) use of inhaled steroids: Secondary | ICD-10-CM

## 2024-01-31 DIAGNOSIS — D751 Secondary polycythemia: Secondary | ICD-10-CM | POA: Diagnosis present

## 2024-01-31 DIAGNOSIS — R300 Dysuria: Secondary | ICD-10-CM | POA: Diagnosis not present

## 2024-01-31 DIAGNOSIS — N3000 Acute cystitis without hematuria: Secondary | ICD-10-CM | POA: Diagnosis not present

## 2024-01-31 DIAGNOSIS — G40909 Epilepsy, unspecified, not intractable, without status epilepticus: Secondary | ICD-10-CM | POA: Diagnosis present

## 2024-01-31 DIAGNOSIS — R6510 Systemic inflammatory response syndrome (SIRS) of non-infectious origin without acute organ dysfunction: Secondary | ICD-10-CM | POA: Diagnosis present

## 2024-01-31 DIAGNOSIS — Z8619 Personal history of other infectious and parasitic diseases: Secondary | ICD-10-CM

## 2024-01-31 DIAGNOSIS — N179 Acute kidney failure, unspecified: Secondary | ICD-10-CM | POA: Diagnosis present

## 2024-01-31 DIAGNOSIS — R809 Proteinuria, unspecified: Secondary | ICD-10-CM | POA: Diagnosis present

## 2024-01-31 DIAGNOSIS — F1410 Cocaine abuse, uncomplicated: Secondary | ICD-10-CM | POA: Diagnosis present

## 2024-01-31 DIAGNOSIS — F32A Depression, unspecified: Secondary | ICD-10-CM | POA: Diagnosis present

## 2024-01-31 DIAGNOSIS — Z87440 Personal history of urinary (tract) infections: Secondary | ICD-10-CM

## 2024-01-31 DIAGNOSIS — J441 Chronic obstructive pulmonary disease with (acute) exacerbation: Secondary | ICD-10-CM | POA: Diagnosis present

## 2024-01-31 DIAGNOSIS — J9601 Acute respiratory failure with hypoxia: Secondary | ICD-10-CM | POA: Diagnosis present

## 2024-01-31 DIAGNOSIS — N200 Calculus of kidney: Secondary | ICD-10-CM | POA: Diagnosis present

## 2024-01-31 LAB — CBC WITH DIFFERENTIAL/PLATELET
Abs Immature Granulocytes: 0.04 K/uL (ref 0.00–0.07)
Basophils Absolute: 0.1 K/uL (ref 0.0–0.1)
Basophils Relative: 0 %
Eosinophils Absolute: 0.2 K/uL (ref 0.0–0.5)
Eosinophils Relative: 1 %
HCT: 49.3 % — ABNORMAL HIGH (ref 36.0–46.0)
Hemoglobin: 16.6 g/dL — ABNORMAL HIGH (ref 12.0–15.0)
Immature Granulocytes: 0 %
Lymphocytes Relative: 27 %
Lymphs Abs: 3.5 K/uL (ref 0.7–4.0)
MCH: 30.3 pg (ref 26.0–34.0)
MCHC: 33.7 g/dL (ref 30.0–36.0)
MCV: 90.1 fL (ref 80.0–100.0)
Monocytes Absolute: 1 K/uL (ref 0.1–1.0)
Monocytes Relative: 8 %
Neutro Abs: 8.1 K/uL — ABNORMAL HIGH (ref 1.7–7.7)
Neutrophils Relative %: 64 %
Platelets: 189 K/uL (ref 150–400)
RBC: 5.47 MIL/uL — ABNORMAL HIGH (ref 3.87–5.11)
RDW: 12.3 % (ref 11.5–15.5)
WBC: 12.8 K/uL — ABNORMAL HIGH (ref 4.0–10.5)
nRBC: 0 % (ref 0.0–0.2)

## 2024-01-31 LAB — COMPREHENSIVE METABOLIC PANEL WITH GFR
ALT: 12 U/L (ref 0–44)
AST: 22 U/L (ref 15–41)
Albumin: 4.6 g/dL (ref 3.5–5.0)
Alkaline Phosphatase: 87 U/L (ref 38–126)
Anion gap: 12 (ref 5–15)
BUN: 24 mg/dL — ABNORMAL HIGH (ref 6–20)
CO2: 26 mmol/L (ref 22–32)
Calcium: 10.2 mg/dL (ref 8.9–10.3)
Chloride: 102 mmol/L (ref 98–111)
Creatinine, Ser: 1.15 mg/dL — ABNORMAL HIGH (ref 0.44–1.00)
GFR, Estimated: 58 mL/min — ABNORMAL LOW (ref >60)
Glucose, Bld: 87 mg/dL (ref 70–99)
Potassium: 3.7 mmol/L (ref 3.5–5.1)
Sodium: 140 mmol/L (ref 135–145)
Total Bilirubin: 0.5 mg/dL (ref 0.0–1.2)
Total Protein: 7.7 g/dL (ref 6.5–8.1)

## 2024-01-31 LAB — LACTIC ACID, PLASMA: Lactic Acid, Venous: 1.2 mmol/L (ref 0.5–1.9)

## 2024-01-31 NOTE — ED Triage Notes (Signed)
 Pt presents via POV c/o dysuria x2 days. Reports seen PCP today and sent to the ED. Denies fever. Reports has had sepsis in the past with bacteremia.

## 2024-02-01 ENCOUNTER — Encounter (HOSPITAL_BASED_OUTPATIENT_CLINIC_OR_DEPARTMENT_OTHER): Payer: Self-pay

## 2024-02-01 ENCOUNTER — Emergency Department (HOSPITAL_BASED_OUTPATIENT_CLINIC_OR_DEPARTMENT_OTHER)

## 2024-02-01 ENCOUNTER — Inpatient Hospital Stay (HOSPITAL_BASED_OUTPATIENT_CLINIC_OR_DEPARTMENT_OTHER)
Admission: EM | Admit: 2024-02-01 | Discharge: 2024-02-01 | DRG: 689 | Discharge status: Against Medical Advice | Attending: Internal Medicine | Admitting: Internal Medicine

## 2024-02-01 DIAGNOSIS — Z8619 Personal history of other infectious and parasitic diseases: Secondary | ICD-10-CM | POA: Diagnosis not present

## 2024-02-01 DIAGNOSIS — F32A Depression, unspecified: Secondary | ICD-10-CM | POA: Diagnosis present

## 2024-02-01 DIAGNOSIS — N3000 Acute cystitis without hematuria: Secondary | ICD-10-CM

## 2024-02-01 DIAGNOSIS — J441 Chronic obstructive pulmonary disease with (acute) exacerbation: Secondary | ICD-10-CM

## 2024-02-01 DIAGNOSIS — J9601 Acute respiratory failure with hypoxia: Secondary | ICD-10-CM

## 2024-02-01 DIAGNOSIS — R6510 Systemic inflammatory response syndrome (SIRS) of non-infectious origin without acute organ dysfunction: Secondary | ICD-10-CM | POA: Diagnosis present

## 2024-02-01 DIAGNOSIS — Z87440 Personal history of urinary (tract) infections: Secondary | ICD-10-CM | POA: Diagnosis not present

## 2024-02-01 DIAGNOSIS — A419 Sepsis, unspecified organism: Principal | ICD-10-CM

## 2024-02-01 DIAGNOSIS — N179 Acute kidney failure, unspecified: Secondary | ICD-10-CM | POA: Diagnosis present

## 2024-02-01 DIAGNOSIS — J439 Emphysema, unspecified: Secondary | ICD-10-CM | POA: Diagnosis present

## 2024-02-01 DIAGNOSIS — Z885 Allergy status to narcotic agent status: Secondary | ICD-10-CM | POA: Diagnosis not present

## 2024-02-01 DIAGNOSIS — G40909 Epilepsy, unspecified, not intractable, without status epilepticus: Secondary | ICD-10-CM | POA: Diagnosis present

## 2024-02-01 DIAGNOSIS — Z720 Tobacco use: Secondary | ICD-10-CM | POA: Diagnosis present

## 2024-02-01 DIAGNOSIS — R809 Proteinuria, unspecified: Secondary | ICD-10-CM | POA: Diagnosis present

## 2024-02-01 DIAGNOSIS — F17210 Nicotine dependence, cigarettes, uncomplicated: Secondary | ICD-10-CM

## 2024-02-01 DIAGNOSIS — R911 Solitary pulmonary nodule: Secondary | ICD-10-CM | POA: Diagnosis not present

## 2024-02-01 DIAGNOSIS — N390 Urinary tract infection, site not specified: Secondary | ICD-10-CM | POA: Diagnosis not present

## 2024-02-01 DIAGNOSIS — Z7951 Long term (current) use of inhaled steroids: Secondary | ICD-10-CM | POA: Diagnosis not present

## 2024-02-01 DIAGNOSIS — N200 Calculus of kidney: Secondary | ICD-10-CM | POA: Diagnosis present

## 2024-02-01 DIAGNOSIS — D751 Secondary polycythemia: Secondary | ICD-10-CM

## 2024-02-01 DIAGNOSIS — F1410 Cocaine abuse, uncomplicated: Secondary | ICD-10-CM | POA: Diagnosis present

## 2024-02-01 DIAGNOSIS — G9341 Metabolic encephalopathy: Principal | ICD-10-CM

## 2024-02-01 DIAGNOSIS — R300 Dysuria: Secondary | ICD-10-CM | POA: Diagnosis present

## 2024-02-01 LAB — URINALYSIS, W/ REFLEX TO CULTURE (INFECTION SUSPECTED)
Bilirubin Urine: NEGATIVE
Glucose, UA: NEGATIVE mg/dL
Ketones, ur: NEGATIVE mg/dL
Nitrite: POSITIVE — AB
Protein, ur: 30 mg/dL — AB
Specific Gravity, Urine: 1.027 (ref 1.005–1.030)
WBC, UA: >50 WBC/hpf (ref 0–5)
pH: 5.5 (ref 5.0–8.0)

## 2024-02-01 LAB — RAPID URINE DRUG SCREEN, HOSP PERFORMED
Amphetamines: NOT DETECTED
Barbiturates: NOT DETECTED
Benzodiazepines: NOT DETECTED
Cocaine: POSITIVE — AB
Opiates: NOT DETECTED
Tetrahydrocannabinol: NOT DETECTED

## 2024-02-01 LAB — I-STAT VENOUS BLOOD GAS, ED
Acid-Base Excess: 3 mmol/L — ABNORMAL HIGH (ref 0.0–2.0)
Bicarbonate: 28.8 mmol/L — ABNORMAL HIGH (ref 20.0–28.0)
Calcium, Ion: 1.13 mmol/L — ABNORMAL LOW (ref 1.15–1.40)
HCT: 40 % (ref 36.0–46.0)
Hemoglobin: 13.6 g/dL (ref 12.0–15.0)
O2 Saturation: 63 %
Patient temperature: 97.7
Potassium: 4.1 mmol/L (ref 3.5–5.1)
Sodium: 140 mmol/L (ref 135–145)
TCO2: 30 mmol/L (ref 22–32)
pCO2, Ven: 47.5 mmHg (ref 44–60)
pH, Ven: 7.388 (ref 7.25–7.43)
pO2, Ven: 33 mmHg (ref 32–45)

## 2024-02-01 LAB — PREGNANCY, URINE: Preg Test, Ur: NEGATIVE

## 2024-02-01 MED ORDER — ALBUTEROL SULFATE (2.5 MG/3ML) 0.083% IN NEBU: 2.5000 mg | INHALATION_SOLUTION | Freq: Four times a day (QID) | RESPIRATORY_TRACT | Status: DC | PRN

## 2024-02-01 MED ORDER — ENOXAPARIN SODIUM 30 MG/0.3ML IJ SOSY: 30.0000 mg | PREFILLED_SYRINGE | INTRAMUSCULAR | Status: DC

## 2024-02-01 MED ORDER — LACTATED RINGERS IV BOLUS
1000.0000 mL | Freq: Once | INTRAVENOUS | Status: AC
  Administered 2024-02-01 (×2): 1000 mL via INTRAVENOUS

## 2024-02-01 MED ORDER — ACETAMINOPHEN 650 MG RE SUPP: 650.0000 mg | Freq: Four times a day (QID) | RECTAL | Status: DC | PRN

## 2024-02-01 MED ORDER — SODIUM CHLORIDE 0.9 % IV SOLN: INTRAVENOUS | Status: DC

## 2024-02-01 MED ORDER — ONDANSETRON HCL 4 MG/2ML IJ SOLN: 4.0000 mg | Freq: Four times a day (QID) | INTRAMUSCULAR | Status: DC | PRN

## 2024-02-01 MED ORDER — ONDANSETRON HCL 4 MG PO TABS: 4.0000 mg | ORAL_TABLET | Freq: Four times a day (QID) | ORAL | Status: DC | PRN

## 2024-02-01 MED ORDER — SODIUM CHLORIDE 0.9 % IV SOLN
1.0000 g | Freq: Once | INTRAVENOUS | Status: AC
  Administered 2024-02-01 (×2): 1 g via INTRAVENOUS

## 2024-02-01 MED ORDER — ACETAMINOPHEN 325 MG PO TABS: 650.0000 mg | ORAL_TABLET | Freq: Four times a day (QID) | ORAL | Status: DC | PRN

## 2024-02-01 MED ORDER — IPRATROPIUM-ALBUTEROL 0.5-2.5 (3) MG/3ML IN SOLN
3.0000 mL | Freq: Two times a day (BID) | RESPIRATORY_TRACT | Status: DC
  Administered 2024-02-01 (×2): 3 mL via RESPIRATORY_TRACT
  Filled 2024-02-01: qty 3

## 2024-02-01 MED ORDER — SODIUM CHLORIDE 0.9 % IV SOLN
1.0000 g | Freq: Two times a day (BID) | INTRAVENOUS | Status: DC
  Administered 2024-02-01 (×2): 1 g via INTRAVENOUS
  Filled 2024-02-01 (×2): qty 20

## 2024-02-01 MED ORDER — SODIUM CHLORIDE 0.9 % IV SOLN
1.0000 g | Freq: Once | INTRAVENOUS | Status: AC
  Administered 2024-02-01 (×2): 1 g via INTRAVENOUS
  Filled 2024-02-01: qty 10

## 2024-02-01 MED ORDER — NALOXONE HCL 0.4 MG/ML IJ SOLN
0.4000 mg | Freq: Once | INTRAMUSCULAR | Status: AC
  Administered 2024-02-01 (×2): 0.4 mg via INTRAVENOUS
  Filled 2024-02-01: qty 1

## 2024-02-01 NOTE — ED Notes (Signed)
 Requested transport via [place] at [facility] 03:58a

## 2024-02-01 NOTE — ED Notes (Signed)
 Patient resting in bed at this time. Patient appears comfortable. Respirations even and unlabored. Call light within reach. Stretcher locked and in lowest position.

## 2024-02-01 NOTE — ED Notes (Addendum)
 Pt refusing to wear tele monitoring. States she is leaving this morning. Notified of admission bed available at [facility]. Pt states she is willing to be transferred to [facility] at this time but states I am not staying there forever.

## 2024-02-01 NOTE — ED Provider Notes (Signed)
 [place] EMERGENCY DEPARTMENT AT [facility] Provider Note   CSN: [id number] Arrival date & time: 01/31/24  2202     Patient presents with: Dysuria   Deanna Klein is a 48 y.o. female.   Patient with a history of substance abuse though she denies any recent substance use here with dysuria and pain with urination for the past 1 day.  She was seen at [facility] and referred to the ED with concern for UTI and sepsis.  She was hospitalized in February with sepsis and bacteremia but left AGAINST MEDICAL ADVICE.  She describes pain with urination and frequency and urgency over the past 1 day.  Denies any blood in the urine.  Denies any chest pain or shortness of breath.  Does not think she has had a fever.  No significant abdominal pain or back pain.  No history of kidney stones.  States she was told to come to the ED by [facility] with concern for sepsis and bacteremia.  The history is provided by the patient.  Dysuria Associated symptoms: nausea   Associated symptoms: no abdominal pain, no fever and no vomiting        Prior to Admission medications   Medication Sig Start Date End Date Taking? Authorizing Provider  albuterol  (VENTOLIN  HFA) 108 (90 Base) MCG/ACT inhaler Inhale 1-2 puffs into the lungs every 6 (six) hours as needed for wheezing or shortness of breath. 01/29/23   Francella Rogue, MD  budesonide -formoterol  (SYMBICORT ) 80-4.5 MCG/ACT inhaler Inhale 2 puffs into the lungs 2 (two) times daily. 01/29/23   Francella Rogue, MD    Allergies: Darvocet [propoxyphene n-acetaminophen ] and Percocet [oxycodone -acetaminophen ]    Review of Systems  Constitutional:  Negative for activity change, appetite change and fever.  HENT:  Negative for congestion and rhinorrhea.   Respiratory:  Negative for cough, chest tightness and shortness of breath.   Cardiovascular:  Negative for chest pain.  Gastrointestinal:  Positive for nausea. Negative for abdominal pain and  vomiting.  Genitourinary:  Positive for dysuria and urgency.  Musculoskeletal:  Negative for arthralgias and myalgias.  Skin:  Negative for rash.  Neurological:  Negative for weakness and headaches.   all other systems are negative except as noted in the HPI and PMH.    Updated Vital Signs BP (!) 104/8   Pulse 96   Temp 97.7 F (36.5 C)   Resp 18   SpO2 96%   Physical Exam Vitals and nursing note reviewed.  Constitutional:      General: She is not in acute distress.    Appearance: She is well-developed.     Comments: Disheveled, sleepy, slow to respond  HENT:     Head: Normocephalic and atraumatic.     Mouth/Throat:     Pharynx: No oropharyngeal exudate.  Eyes:     Conjunctiva/sclera: Conjunctivae normal.     Pupils: Pupils are equal, round, and reactive to light.  Neck:     Comments: No meningismus. Cardiovascular:     Rate and Rhythm: Normal rate and regular rhythm.     Heart sounds: Normal heart sounds. No murmur heard. Pulmonary:     Effort: Pulmonary effort is normal. No respiratory distress.     Breath sounds: Normal breath sounds.  Abdominal:     Palpations: Abdomen is soft.     Tenderness: There is abdominal tenderness. There is no guarding or rebound.     Comments: Suprapubic tenderness  Musculoskeletal:  General: No tenderness. Normal range of motion.     Cervical back: Normal range of motion and neck supple.     Comments: No CVA tenderness  Skin:    General: Skin is warm.  Neurological:     Mental Status: She is alert and oriented to person, place, and time.     Cranial Nerves: No cranial nerve deficit.     Motor: No abnormal muscle tone.     Coordination: Coordination normal.     Comments:  5/5 strength throughout. CN 2-12 intact.Equal grip strength.   Psychiatric:        Behavior: Behavior normal.     (all labs ordered are listed, but only abnormal results are displayed) Labs Reviewed  COMPREHENSIVE METABOLIC PANEL WITH GFR - Abnormal;  Notable for the following components:      Result Value   BUN 24 (*)    Creatinine, Ser 1.15 (*)    GFR, Estimated 58 (*)    All other components within normal limits  CBC WITH DIFFERENTIAL/PLATELET - Abnormal; Notable for the following components:   WBC 12.8 (*)    RBC 5.47 (*)    Hemoglobin 16.6 (*)    HCT 49.3 (*)    Neutro Abs 8.1 (*)    All other components within normal limits  URINALYSIS, W/ REFLEX TO CULTURE (INFECTION SUSPECTED) - Abnormal; Notable for the following components:   APPearance HAZY (*)    Hgb urine dipstick SMALL (*)    Protein, ur 30 (*)    Nitrite POSITIVE (*)    Leukocytes,Ua LARGE (*)    Bacteria, UA MANY (*)    All other components within normal limits  I-STAT VENOUS BLOOD GAS, ED - Abnormal; Notable for the following components:   Bicarbonate 28.8 (*)    Acid-Base Excess 3.0 (*)    Calcium, Ion 1.13 (*)    All other components within normal limits  CULTURE, BLOOD (ROUTINE X 2)  CULTURE, BLOOD (ROUTINE X 2)  URINE CULTURE  LACTIC ACID, PLASMA  PREGNANCY, URINE  BLOOD GAS, VENOUS    EKG: None  Radiology: CT Renal Stone Study Result Date: 02/01/2024 CLINICAL DATA:  Dysuria for 2 days EXAM: CT ABDOMEN AND PELVIS WITHOUT CONTRAST TECHNIQUE: Multidetector CT imaging of the abdomen and pelvis was performed following the standard protocol without IV contrast. RADIATION DOSE REDUCTION: This exam was performed according to the departmental dose-optimization program which includes automated exposure control, adjustment of the mA and/or kV according to patient size and/or use of iterative reconstruction technique. COMPARISON:  08/20/2023 FINDINGS: Lower chest: Severe emphysematous changes are noted of the lung bases. Hepatobiliary: No focal liver abnormality is seen. No gallstones, gallbladder wall thickening, or biliary dilatation. Pancreas: Unremarkable. No pancreatic ductal dilatation or surrounding inflammatory changes. Spleen: Normal in size without focal  abnormality. Adrenals/Urinary Tract: Adrenal glands are within normal limits. Kidneys demonstrate tiny left upper pole renal stone. No renal calculi are noted on the right. No obstructive changes are seen. Stable scarring is noted within the right kidney. The bladder is partially distended. Stomach/Bowel: No obstructive or inflammatory changes of the colon are seen. The appendix is within normal limits. Stomach and small bowel are unremarkable. Vascular/Lymphatic: No significant vascular findings are present. No enlarged abdominal or pelvic lymph nodes. Reproductive: Uterus and bilateral adnexa are unremarkable. Other: No abdominal wall hernia or abnormality. No abdominopelvic ascites. Musculoskeletal: No acute or significant osseous findings. IMPRESSION: Tiny left renal stone. No acute abnormality noted. Electronically Signed   By: Oneil  Lukens M.D.   On: 02/01/2024 01:32     .Critical Care  Performed by: Carita Senior, MD Authorized by: Carita Senior, MD   Critical care provider statement:    Critical care time (minutes):  45   Critical care time was exclusive of:  Separately billable procedures and treating other patients   Critical care was necessary to treat or prevent imminent or life-threatening deterioration of the following conditions:  Sepsis   Critical care was time spent personally by me on the following activities:  Development of treatment plan with patient or surrogate, discussions with consultants, evaluation of patient's response to treatment, examination of patient, ordering and review of laboratory studies, ordering and review of radiographic studies, ordering and performing treatments and interventions, pulse oximetry, re-evaluation of patient's condition, review of old charts, blood draw for specimens and obtaining history from patient or surrogate   I assumed direction of critical care for this patient from another provider in my specialty: no     Care discussed with:  admitting provider      Medications Ordered in the ED  lactated ringers  bolus 1,000 mL (has no administration in time range)  cefTRIAXone  (ROCEPHIN ) 1 g in sodium chloride  0.9 % 100 mL IVPB (has no administration in time range)                                    Medical Decision Making Amount and/or Complexity of Data Reviewed Labs: ordered. Decision-making details documented in ED Course. Radiology: ordered and independent interpretation performed. Decision-making details documented in ED Course. ECG/medicine tests: ordered and independent interpretation performed. Decision-making details documented in ED Course.  Risk Prescription drug management. Decision regarding hospitalization.   Dysuria and suprapubic pain for the past 1 day.  Stable vitals on arrival.  Abdomen soft without peritoneal signs.  Patient somnolent on arrival but arouses to voice.  Denies any ingestions.  Narcan  given without response.  Initial blood pressure was 104 systolic but on subsequent checks was found to be in the 80s.  Code sepsis was activated.  She is given IV fluids and IV antibiotics blood cultures were obtained.  Does have history of ESBL E. coli UTI infection in February.  Lab work today shows normal lactate.  Does have leukocytosis with infected appearing urine.  She is given IV meropenem  given her ESBL history.  CT scan is negative for infected kidney stone.  Given her decreased mental status, Narcan  was given without much effect.  Blood pressure improved after fluids to 110 systolic.  Heart rate 60s.  VBG without significant CO2 retention.  Given concern for early sepsis with hypotension upon admission.  Does have ESBL history.  IV fluids and IV antibiotics given with improvement of blood pressure and mental status.  Admission discussed with Dr. Franky     Final diagnoses:  Sepsis with encephalopathy without septic shock, due to unspecified organism [facility])  Acute cystitis without  hematuria    ED Discharge Orders     None          Carita Senior, MD 02/01/24 [phone]

## 2024-02-01 NOTE — Progress Notes (Signed)
                                                  Against Medical Advice Patient at this time expresses desire to leave the Hospital immediately, patient has been warned that this is not Medically advisable at this time, and can result in Medical complications like Death and Disability, patient understands and accepts the risks involved and assumes full responsibilty of this decision.  This patient has also been advised that if they feel the need for further medical assistance to return to any available ER or dial [phone].  Informed by Nursing staff that this patient has left care and has signed the form  Against Medical Advice on 02/01/2024 at  2000 hrs.  Lynwood Kipper BSN MSNA MSN ACNPC-AG Acute Care Nurse Practitioner [facility]

## 2024-02-01 NOTE — ED Notes (Signed)
 Patient transported to CT

## 2024-02-01 NOTE — TOC Initial Note (Signed)
 Transition of Care [facility]) - Initial/Assessment Note   Patient Details  Name: Deanna Klein MRN: [id number] Date of Birth: [birth year]  Transition of Care [facility]) CM/SW Contact:    Duwaine GORMAN Aran, LCSW Phone Number: 02/01/2024, 9:37 AM  Clinical Narrative: Patient is from home and is currently requiring 2L/min oxygen and IV antibiotics. Care management following for possible discharge needs.  Expected Discharge Plan: Home/Self Care Barriers to Discharge: Continued Medical Work up  Expected Discharge Plan and Services In-house Referral: Clinical Social Work Living arrangements for the past 2 months: Single Family Home  Prior Living Arrangements/Services Living arrangements for the past 2 months: Single Family Home Patient language and need for interpreter reviewed:: Yes Do you feel safe going back to the place where you live?: Yes      Need for Family Participation in Patient Care: No (Comment) Care giver support system in place?: Yes (comment) Criminal Activity/Legal Involvement Pertinent to Current Situation/Hospitalization: No - Comment as needed  Emotional Assessment Alcohol / Substance Use: Not Applicable Psych Involvement: No (comment)  Admission diagnosis:  SIRS (systemic inflammatory response syndrome) (HCC) [R65.10] Acute cystitis without hematuria [N30.00] Sepsis with encephalopathy without septic shock, due to unspecified organism (HCC) [A41.9, R65.20, G93.41] Patient Active Problem List   Diagnosis Date Noted   SIRS (systemic inflammatory response syndrome) (HCC) 02/01/2024   UTI due to extended-spectrum beta lactamase (ESBL) producing Escherichia coli 08/21/2023   COPD with acute exacerbation (HCC) 01/29/2023   Acute hypoxic respiratory failure (HCC) 01/28/2023   Sepsis (HCC) 01/28/2023   UTI (urinary tract infection) 01/28/2023   Hypokalemia 01/28/2023   Necrotizing pneumonia (HCC) 02/02/2014   Lung abscess (HCC) 02/02/2014   Solitary pulmonary nodule 02/02/2014    COPD (chronic obstructive pulmonary disease) (HCC) 02/02/2014   Seizure disorder (HCC) 02/02/2014   Depression 02/02/2014   Polysubstance abuse (HCC) 02/02/2014   Normocytic anemia 02/02/2014   Protein-calorie malnutrition, severe (HCC) 03/07/2013   Cocaine abuse (HCC) 03/06/2013   Smoking 03/06/2013   Ovarian cyst 05/30/2011   PCP:  [facility]:   [id number] [address], [place] - [address] [place] [address] Phone: [phone] Fax: [phone]  [facility] - [place], [place] - [address] [place] [address] Phone: [phone] Fax: [phone]  [facility] [address] [place] [address] Phone: [phone] Fax: [phone]  Social Drivers of Health (SDOH) Social History: SDOH Screenings   Food Insecurity: No Food Insecurity (08/20/2023)  Housing: High Risk (08/20/2023)  Transportation Needs: No Transportation Needs (08/20/2023)  Utilities: Not At Risk (08/20/2023)  Tobacco Use: High Risk (02/01/2024)   SDOH Interventions: Housing Interventions: Intervention Not Indicated, Inpatient TOC  Readmission Risk Interventions     No data to display

## 2024-02-01 NOTE — Plan of Care (Signed)

## 2024-02-01 NOTE — Progress Notes (Addendum)
 Patient arrived via [facility] transport. Alert, SR on tele, 2LNC (saturation 86% per transporters ) No complaints of pain, requesting to eat. Family at bedside. Care assumed.

## 2024-02-01 NOTE — H&P (Signed)
 History and Physical    Patient: Deanna Klein FMW:[id number] DOB: [birth year] DOA: 02/01/2024 DOS: the patient was seen and examined on 02/01/2024 PCP: [facility]  Patient coming from: Home  Chief Complaint:  Chief Complaint  Patient presents with   Dysuria   HPI: Deanna Klein is a 48 y.o. female with medical history significant of cocaine abuse, depression, seizure disorder underweight, COPD/emphysema history of lung masses.  History of necrotizing pneumonia, history of severe protein calorie malnutrition, history of ESBL UTI, history of sepsis who presented to the emergency department complaints of dysuria, urgency and nausea.  No suprapubic pain, emesis, diarrhea, constipation, melena or hematochezia.  No flank pain or hematuria.  She denied fever, chills, rhinorrhea, sore throat, wheezing or hemoptysis.  No chest pain, palpitations, diaphoresis, PND, orthopnea or pitting edema of the lower extremities.  No polyuria, polydipsia, polyphagia or blurred vision.   Lab work: Urinalysis showed small hemoglobin, positive nitrates, large leukocyte esterase, proteinuria 30 mg/dL, 88-49 RBC, greater than 50 WBC, many bacteria and positive WBC clumps.  Urine pregnancy test was negative.  Lactic acid was normal.  Venous blood gas showed a bicarbonate of 28.8 and acid base excess of 3.0 mmol/L with normal pH, pCO2 and pO2.  CBC showed white count 12.8, hemoglobin 16.6 g/dL platelets 810.  CMP showed BUN of 24 and creatinine of 1.15 mg/dL, the rest of the CMP measurements were normal.  Imaging: CT renal stone study showed a very small left renal stone with no acute abnormality.  Portable chest radiograph showed emphysema without evidence of acute cardiopulmonary disease.  There was a new nodular density in the right upper lung that is indeterminate.  Increasing density involving a nodular opacity within the left upper lobe, indeterminate this would be better assessed with nonemergent CT  imaging.   ED course: Initial vital signs were temperature 97.7 F, pulse 86, respiration 18, BP 104/80 mmHg O2 sat 96% on room air.  The patient received LR 2000 mL bolus, Narcan  0.4 mg IVP, ceftriaxone  1 g IVPB and meropenem  1 g IVPB.  There was no improvement in AMS after Narcan  was given.  Review of Systems: As mentioned in the history of present illness. All other systems reviewed and are negative. Past Medical History:  Diagnosis Date   Cocaine abuse (HCC)    Depression    Seizures (HCC)    STD (female)    Substance abuse (HCC)    crack cocaine   Past Surgical History:  Procedure Laterality Date   right lung collapse     Social History:  reports that she has been smoking cigarettes. She has a 19 pack-year smoking history. She has never used smokeless tobacco. She reports current drug use. Drugs: Crack cocaine and Cocaine. She reports that she does not drink alcohol.  Allergies  Allergen Reactions   Darvocet [Propoxyphene N-Acetaminophen ] Hives   Percocet [Oxycodone -Acetaminophen ] Hives    History reviewed. No pertinent family history.  Prior to Admission medications   Medication Sig Start Date End Date Taking? Authorizing Provider  albuterol  (VENTOLIN  HFA) 108 (90 Base) MCG/ACT inhaler Inhale 1-2 puffs into the lungs every 6 (six) hours as needed for wheezing or shortness of breath. 01/29/23   Francella Rogue, MD  budesonide -formoterol  (SYMBICORT ) 80-4.5 MCG/ACT inhaler Inhale 2 puffs into the lungs 2 (two) times daily. 01/29/23   Francella Rogue, MD    Physical Exam: Vitals:   02/01/24 0730 02/01/24 0800 02/01/24 0815 02/01/24 0900  BP: 101/67 94/80 93/70  106/86  Pulse:    65  Resp:    16  Temp:      TempSrc:      SpO2:    90%  Weight:   40.8 kg   Height:   5' 4 (1.626 m)    Physical Exam Vitals and nursing note reviewed.  Constitutional:      General: She is awake. She is not in acute distress.    Appearance: Normal appearance. She is underweight. She is  ill-appearing.  HENT:     Head: Normocephalic.     Nose: No rhinorrhea.     Mouth/Throat:     Mouth: Mucous membranes are moist.  Eyes:     General: No scleral icterus.    Pupils: Pupils are equal, round, and reactive to light.  Cardiovascular:     Rate and Rhythm: Normal rate and regular rhythm.  Pulmonary:     Effort: Pulmonary effort is normal.     Breath sounds: Normal breath sounds.  Abdominal:     General: Bowel sounds are normal.     Palpations: Abdomen is soft.  Musculoskeletal:     Cervical back: Neck supple.     Right lower leg: No edema.     Left lower leg: No edema.  Skin:    General: Skin is warm and dry.  Neurological:     General: No focal deficit present.     Mental Status: She is alert and oriented to person, place, and time.  Psychiatric:        Mood and Affect: Mood normal.        Behavior: Behavior is cooperative.     Data Reviewed:  Results are pending, will review when available.  Assessment and Plan: Principal Problem:   SIRS (systemic inflammatory response syndrome) (HCC) In the setting of:   Acute UTI (urinary tract infection) Admit to PCU/inpatient. Continue supplemental oxygen. Continue ceftriaxone  1 g IVPB daily. Follow-up urine culture and sensitivity. Follow-up blood culture and sensitivity. Follow-up CBC and chemistry in the morning.  Active Problems:   Acute hypoxic respiratory failure (HCC) Patient was initially altered. Did not respond to Narcan .    COPD with acute exacerbation (HCC) Minimal wheezing. DuoNeb twice daily. Begin albuterol  nebs as needed.    AKI (acute kidney injury) (HCC) Continue IV fluids. Avoid hypotension. Avoid nephrotoxins. Monitor intake and output. Monitor renal function electrolytes.    Seizure disorder (HCC) Currently not on antiepileptic medications.    Pulmonary nodules Will need nonemergent CT of the chest.    Tobacco use Tobacco cessation advised. Declined nicotine  replacement  therapy.    Erythrocytosis In the setting of tobacco use and COPD.       Advance Care Planning:   Code Status: Full Code   Consults:   Family Communication:   Severity of Illness: The appropriate patient status for this patient is OBSERVATION. Observation status is judged to be reasonable and necessary in order to provide the required intensity of service to ensure the patient's safety. The patient's presenting symptoms, physical exam findings, and initial radiographic and laboratory data in the context of their medical condition is felt to place them at decreased risk for further clinical deterioration. Furthermore, it is anticipated that the patient will be medically stable for discharge from the hospital within 2 midnights of admission.   Author: Alm Dorn Castor, MD 02/01/2024 9:36 AM  For on call review [email or web address].   This document was prepared using Dragon voice recognition software and may contain some unintended transcription  errors.

## 2024-02-02 NOTE — Discharge Summary (Signed)
 Physician Discharge Summary   Patient: Deanna Klein MRN: [id number] DOB: 03-Dec-1975  Admit date:     02/01/2024  Discharge date: 02/01/2024  Discharge Physician: Alm Dorn Castor   [facility]P: [facility]   Recommendations at discharge:   The patient left AMA before treatment was completed.  Discharge Diagnoses: Principal Problem:   SIRS (systemic inflammatory response syndrome) (HCC) Active Problems:   Seizure disorder (HCC)   Acute hypoxic respiratory failure (HCC)   COPD with acute exacerbation (HCC)   Pulmonary nodules   Tobacco use   Erythrocytosis   Acute UTI (urinary tract infection)   AKI (acute kidney injury) [facility])  Hospital Course:  Deanna Klein is a 48 y.o. female with medical history significant of cocaine abuse, depression, seizure disorder underweight, COPD/emphysema history of lung masses.  History of necrotizing pneumonia, history of severe protein calorie malnutrition, history of ESBL UTI, history of sepsis who presented to the emergency department complaints of dysuria, urgency and nausea.  No suprapubic pain, emesis, diarrhea, constipation, melena or hematochezia.  No flank pain or hematuria.  She denied fever, chills, rhinorrhea, sore throat, wheezing or hemoptysis.  No chest pain, palpitations, diaphoresis, PND, orthopnea or pitting edema of the lower extremities.  No polyuria, polydipsia, polyphagia or blurred vision.    Lab work: Urinalysis showed small hemoglobin, positive nitrates, large leukocyte esterase, proteinuria 30 mg/dL, 88-49 RBC, greater than 50 WBC, many bacteria and positive WBC clumps.  Urine pregnancy test was negative.  Lactic acid was normal.  Venous blood gas showed a bicarbonate of 28.8 and acid base excess of 3.0 mmol/L with normal pH, pCO2 and pO2.  CBC showed white count 12.8, hemoglobin 16.6 g/dL platelets 810.  CMP showed BUN of 24 and creatinine of 1.15 mg/dL, the rest of the CMP measurements were normal.   Imaging: CT renal  stone study showed a very small left renal stone with no acute abnormality.  Portable chest radiograph showed emphysema without evidence of acute cardiopulmonary disease.  There was a new nodular density in the right upper lung that is indeterminate.  Increasing density involving a nodular opacity within the left upper lobe, indeterminate this would be better assessed with nonemergent CT imaging.   ED course: Initial vital signs were temperature 97.7 F, pulse 86, respiration 18, BP 104/80 mmHg O2 sat 96% on room air.  The patient received LR 2000 mL bolus, Narcan  0.4 mg IVP, ceftriaxone  1 g IVPB and meropenem  1 g IVPB.  There was no improvement in AMS after Narcan  was given.  Consultants:  Procedures performed:   Disposition: Home Diet recommendation:  Regular diet DISCHARGE MEDICATION: Allergies as of 02/01/2024       Reactions   Darvocet [propoxyphene N-acetaminophen ] Hives   Percocet [oxycodone -acetaminophen ] Hives        Medication List     ASK your doctor about these medications    Ventolin  HFA 108 (90 Base) MCG/ACT inhaler Generic drug: albuterol  Inhale 1-2 puffs into the lungs every 6 (six) hours as needed for wheezing or shortness of breath.        Discharge Exam: Filed Weights   02/01/24 0815  Weight: 40.8 kg   The patient signed AMA before she was reevaluated.  Condition at discharge: fair  The results of significant diagnostics from this hospitalization (including imaging, microbiology, ancillary and laboratory) are listed below for reference.   Imaging Studies: DG Chest 1 View Result Date: 02/01/2024 CLINICAL DATA:  Hypoxia EXAM: CHEST  1 VIEW COMPARISON:  08/20/2023  FINDINGS: Emphysema with marked pulmonary hyperinflation, stable since prior examination. New nodular density is seen within the right upper lung zone, indeterminate. Increasing density involving a nodule opacity within the left upper lobe, indeterminate. No focal consolidation. No pneumothorax or  pleural effusion. Cardiac size within normal limits. Pulmonary vascularity is normal. No acute bone abnormality. IMPRESSION: 1. Emphysema. 2. No radiographic evidence of acute cardiopulmonary disease. 3. New nodular density within the right upper lung zone, indeterminate. Increasing density involving a nodule opacity within the left upper lobe, indeterminate. This would be better assessed with nonemergent CT imaging. Electronically Signed   By: Dorethia Molt M.D.   On: 02/01/2024 02:22   CT Renal Stone Study Result Date: 02/01/2024 CLINICAL DATA:  Dysuria for 2 days EXAM: CT ABDOMEN AND PELVIS WITHOUT CONTRAST TECHNIQUE: Multidetector CT imaging of the abdomen and pelvis was performed following the standard protocol without IV contrast. RADIATION DOSE REDUCTION: This exam was performed according to the departmental dose-optimization program which includes automated exposure control, adjustment of the mA and/or kV according to patient size and/or use of iterative reconstruction technique. COMPARISON:  08/20/2023 FINDINGS: Lower chest: Severe emphysematous changes are noted of the lung bases. Hepatobiliary: No focal liver abnormality is seen. No gallstones, gallbladder wall thickening, or biliary dilatation. Pancreas: Unremarkable. No pancreatic ductal dilatation or surrounding inflammatory changes. Spleen: Normal in size without focal abnormality. Adrenals/Urinary Tract: Adrenal glands are within normal limits. Kidneys demonstrate tiny left upper pole renal stone. No renal calculi are noted on the right. No obstructive changes are seen. Stable scarring is noted within the right kidney. The bladder is partially distended. Stomach/Bowel: No obstructive or inflammatory changes of the colon are seen. The appendix is within normal limits. Stomach and small bowel are unremarkable. Vascular/Lymphatic: No significant vascular findings are present. No enlarged abdominal or pelvic lymph nodes. Reproductive: Uterus and  bilateral adnexa are unremarkable. Other: No abdominal wall hernia or abnormality. No abdominopelvic ascites. Musculoskeletal: No acute or significant osseous findings. IMPRESSION: Tiny left renal stone. No acute abnormality noted. Electronically Signed   By: Oneil Devonshire M.D.   On: 02/01/2024 01:32    Microbiology: Results for orders placed or performed during the hospital encounter of 02/01/24  Blood culture (routine x 2)     Status: None (Preliminary result)   Collection Time: 01/31/24 10:16 PM   Specimen: BLOOD  Result Value Ref Range Status   Specimen Description   Final    BLOOD LEFT ANTECUBITAL Performed at [facility], [address] [place], [place] [address]    Special Requests   Final    BOTTLES DRAWN AEROBIC AND ANAEROBIC Blood Culture results may not be optimal due to an inadequate volume of blood received in culture bottles Performed at [facility], [address], [place], [place] [address]    Culture   Final    NO GROWTH < 24 HOURS Performed at [facility], [address], [place], [place] [address]    Report Status PENDING  Incomplete  Urine Culture     Status: Abnormal (Preliminary result)   Collection Time: 01/31/24 10:16 PM   Specimen: Urine, Random  Result Value Ref Range Status   Specimen Description   Final    URINE, RANDOM Performed at [facility], [address] [place], [place] [address]    Special Requests   Final    NONE Reflexed from [id number] Performed at [facility], [address], [place], [place] [address]    Culture (  A)  Final    >=100,000 COLONIES/mL ESCHERICHIA COLI SUSCEPTIBILITIES TO FOLLOW Performed at [facility], [address], [place], [place] [address]    Report Status PENDING  Incomplete  Blood culture (routine x 2)     Status: None (Preliminary result)   Collection Time: 02/01/24  1:29 AM   Specimen: BLOOD  Result Value Ref Range  Status   Specimen Description   Final    BLOOD BLOOD RIGHT FOREARM Performed at [facility], [address] [place], [place] [address]    Special Requests   Final    BOTTLES DRAWN AEROBIC AND ANAEROBIC Blood Culture results may not be optimal due to an inadequate volume of blood received in culture bottles Performed at [facility], [address] [place], [place] [address]    Culture   Final    NO GROWTH < 24 HOURS Performed at [facility], [address], [place], [place] [address]    Report Status PENDING  Incomplete    Labs: CBC: Recent Labs  Lab 01/31/24 2216 02/01/24 0229  WBC 12.8*  --   NEUTROABS 8.1*  --   HGB 16.6* 13.6  HCT 49.3* 40.0  MCV 90.1  --   PLT 189  --    Basic Metabolic Panel: Recent Labs  Lab 01/31/24 2216 02/01/24 0229  NA 140 140  K 3.7 4.1  CL 102  --   CO2 26  --   GLUCOSE 87  --   BUN 24*  --   CREATININE 1.15*  --   CALCIUM 10.2  --    Liver Function Tests: Recent Labs  Lab 01/31/24 2216  AST 22  ALT 12  ALKPHOS 87  BILITOT 0.5  PROT 7.7  ALBUMIN 4.6   CBG: No results for input(s): GLUCAP in the last 168 hours.  Discharge time spent: less than 30 minutes.  Signed: Alm Dorn Castor, MD [facility] 02/02/2024  This document was prepared using Dragon voice recognition software and may contain some unintended transcription errors.

## 2024-02-03 LAB — URINE CULTURE: Culture: >=100000 — AB

## 2024-02-06 LAB — CULTURE, BLOOD (ROUTINE X 2)
Culture: NO GROWTH
Culture: NO GROWTH

## 2024-03-14 ENCOUNTER — Encounter (HOSPITAL_BASED_OUTPATIENT_CLINIC_OR_DEPARTMENT_OTHER): Payer: Self-pay | Admitting: Emergency Medicine

## 2024-03-14 ENCOUNTER — Inpatient Hospital Stay (HOSPITAL_BASED_OUTPATIENT_CLINIC_OR_DEPARTMENT_OTHER)
Admission: EM | Admit: 2024-03-14 | Discharge: 2024-03-17 | DRG: 871 | Disposition: A | Discharge status: Home / Self Care | Attending: Internal Medicine | Admitting: Internal Medicine

## 2024-03-14 ENCOUNTER — Other Ambulatory Visit: Payer: Self-pay

## 2024-03-14 ENCOUNTER — Emergency Department (HOSPITAL_BASED_OUTPATIENT_CLINIC_OR_DEPARTMENT_OTHER): Admitting: Radiology

## 2024-03-14 DIAGNOSIS — R739 Hyperglycemia, unspecified: Secondary | ICD-10-CM | POA: Diagnosis present

## 2024-03-14 DIAGNOSIS — R0902 Hypoxemia: Secondary | ICD-10-CM | POA: Diagnosis present

## 2024-03-14 DIAGNOSIS — J439 Emphysema, unspecified: Secondary | ICD-10-CM | POA: Diagnosis present

## 2024-03-14 DIAGNOSIS — F17210 Nicotine dependence, cigarettes, uncomplicated: Secondary | ICD-10-CM | POA: Diagnosis present

## 2024-03-14 DIAGNOSIS — R030 Elevated blood-pressure reading, without diagnosis of hypertension: Secondary | ICD-10-CM | POA: Diagnosis present

## 2024-03-14 DIAGNOSIS — Z66 Do not resuscitate: Secondary | ICD-10-CM | POA: Diagnosis present

## 2024-03-14 DIAGNOSIS — A419 Sepsis, unspecified organism: Secondary | ICD-10-CM | POA: Diagnosis not present

## 2024-03-14 DIAGNOSIS — R509 Fever, unspecified: Secondary | ICD-10-CM | POA: Diagnosis not present

## 2024-03-14 DIAGNOSIS — Z8619 Personal history of other infectious and parasitic diseases: Secondary | ICD-10-CM

## 2024-03-14 DIAGNOSIS — Z23 Encounter for immunization: Secondary | ICD-10-CM

## 2024-03-14 DIAGNOSIS — J4489 Other specified chronic obstructive pulmonary disease: Secondary | ICD-10-CM | POA: Diagnosis present

## 2024-03-14 DIAGNOSIS — Z1152 Encounter for screening for COVID-19: Secondary | ICD-10-CM

## 2024-03-14 DIAGNOSIS — R5381 Other malaise: Secondary | ICD-10-CM | POA: Diagnosis present

## 2024-03-14 DIAGNOSIS — F1410 Cocaine abuse, uncomplicated: Secondary | ICD-10-CM | POA: Diagnosis present

## 2024-03-14 DIAGNOSIS — N390 Urinary tract infection, site not specified: Secondary | ICD-10-CM | POA: Diagnosis present

## 2024-03-14 DIAGNOSIS — Z1612 Extended spectrum beta lactamase (ESBL) resistance: Secondary | ICD-10-CM | POA: Diagnosis present

## 2024-03-14 DIAGNOSIS — Z87440 Personal history of urinary (tract) infections: Secondary | ICD-10-CM

## 2024-03-14 DIAGNOSIS — E43 Unspecified severe protein-calorie malnutrition: Secondary | ICD-10-CM | POA: Diagnosis present

## 2024-03-14 DIAGNOSIS — Z681 Body mass index (BMI) 19 or less, adult: Secondary | ICD-10-CM

## 2024-03-14 DIAGNOSIS — Z885 Allergy status to narcotic agent status: Secondary | ICD-10-CM

## 2024-03-14 DIAGNOSIS — J449 Chronic obstructive pulmonary disease, unspecified: Secondary | ICD-10-CM | POA: Diagnosis present

## 2024-03-14 DIAGNOSIS — R6510 Systemic inflammatory response syndrome (SIRS) of non-infectious origin without acute organ dysfunction: Secondary | ICD-10-CM | POA: Diagnosis present

## 2024-03-14 DIAGNOSIS — Z5902 Unsheltered homelessness: Secondary | ICD-10-CM

## 2024-03-14 DIAGNOSIS — F32A Depression, unspecified: Secondary | ICD-10-CM | POA: Diagnosis present

## 2024-03-14 DIAGNOSIS — B9620 Unspecified Escherichia coli [E. coli] as the cause of diseases classified elsewhere: Secondary | ICD-10-CM | POA: Diagnosis present

## 2024-03-14 LAB — CBC WITH DIFFERENTIAL/PLATELET
Abs Immature Granulocytes: 0.05 K/uL (ref 0.00–0.07)
Basophils Absolute: 0 K/uL (ref 0.0–0.1)
Basophils Relative: 0 %
Eosinophils Absolute: 0.1 K/uL (ref 0.0–0.5)
Eosinophils Relative: 1 %
HCT: 44.5 % (ref 36.0–46.0)
Hemoglobin: 15 g/dL (ref 12.0–15.0)
Immature Granulocytes: 0 %
Lymphocytes Relative: 10 %
Lymphs Abs: 1.2 K/uL (ref 0.7–4.0)
MCH: 30.2 pg (ref 26.0–34.0)
MCHC: 33.7 g/dL (ref 30.0–36.0)
MCV: 89.7 fL (ref 80.0–100.0)
Monocytes Absolute: 0.9 K/uL (ref 0.1–1.0)
Monocytes Relative: 8 %
Neutro Abs: 9.5 K/uL — ABNORMAL HIGH (ref 1.7–7.7)
Neutrophils Relative %: 81 %
Platelets: 255 K/uL (ref 150–400)
RBC: 4.96 MIL/uL (ref 3.87–5.11)
RDW: 12.7 % (ref 11.5–15.5)
WBC: 11.7 K/uL — ABNORMAL HIGH (ref 4.0–10.5)
nRBC: 0 % (ref 0.0–0.2)

## 2024-03-14 LAB — COMPREHENSIVE METABOLIC PANEL WITH GFR
ALT: 18 U/L (ref 0–44)
AST: 25 U/L (ref 15–41)
Albumin: 4.6 g/dL (ref 3.5–5.0)
Alkaline Phosphatase: 84 U/L (ref 38–126)
Anion gap: 13 (ref 5–15)
BUN: 14 mg/dL (ref 6–20)
CO2: 24 mmol/L (ref 22–32)
Calcium: 10 mg/dL (ref 8.9–10.3)
Chloride: 99 mmol/L (ref 98–111)
Creatinine, Ser: 0.84 mg/dL (ref 0.44–1.00)
GFR, Estimated: >60 mL/min (ref >60)
Glucose, Bld: 214 mg/dL — ABNORMAL HIGH (ref 70–99)
Potassium: 3.7 mmol/L (ref 3.5–5.1)
Sodium: 136 mmol/L (ref 135–145)
Total Bilirubin: 0.6 mg/dL (ref 0.0–1.2)
Total Protein: 8 g/dL (ref 6.5–8.1)

## 2024-03-14 LAB — LACTIC ACID, PLASMA
Lactic Acid, Venous: 1.7 mmol/L (ref 0.5–1.9)
Lactic Acid, Venous: 1.2 mmol/L (ref 0.5–1.9)

## 2024-03-14 LAB — URINALYSIS, W/ REFLEX TO CULTURE (INFECTION SUSPECTED)
Bilirubin Urine: NEGATIVE
Glucose, UA: NEGATIVE mg/dL
Ketones, ur: NEGATIVE mg/dL
Nitrite: NEGATIVE
Protein, ur: NEGATIVE mg/dL
Specific Gravity, Urine: <1.005 — ABNORMAL LOW (ref 1.005–1.030)
WBC, UA: >50 WBC/hpf (ref 0–5)
pH: 6 (ref 5.0–8.0)

## 2024-03-14 LAB — URINE DRUG SCREEN
Amphetamines: NEGATIVE
Barbiturates: NEGATIVE
Benzodiazepines: NEGATIVE
Cocaine: POSITIVE — AB
Fentanyl: NEGATIVE
Methadone Scn, Ur: NEGATIVE
Opiates: NEGATIVE
Tetrahydrocannabinol: NEGATIVE

## 2024-03-14 LAB — RESP PANEL BY RT-PCR (RSV, FLU A&B, COVID)  RVPGX2
Influenza A by PCR: NEGATIVE
Influenza B by PCR: NEGATIVE
Resp Syncytial Virus by PCR: NEGATIVE
SARS Coronavirus 2 by RT PCR: NEGATIVE

## 2024-03-14 LAB — PROTIME-INR
INR: 0.9 (ref 0.8–1.2)
Prothrombin Time: 12.9 s (ref 11.4–15.2)

## 2024-03-14 MED ORDER — LACTATED RINGERS IV BOLUS
1000.0000 mL | Freq: Once | INTRAVENOUS | Status: AC
  Administered 2024-03-14: 1000 mL via INTRAVENOUS

## 2024-03-14 MED ORDER — LACTATED RINGERS IV SOLN: INTRAVENOUS | Status: AC

## 2024-03-14 MED ORDER — VANCOMYCIN HCL IN DEXTROSE 1-5 GM/200ML-% IV SOLN
1000.0000 mg | Freq: Once | INTRAVENOUS | Status: AC
  Administered 2024-03-14: 1000 mg via INTRAVENOUS
  Filled 2024-03-14: qty 200

## 2024-03-14 MED ORDER — ACETAMINOPHEN 500 MG PO TABS
1000.0000 mg | ORAL_TABLET | Freq: Once | ORAL | Status: AC
  Administered 2024-03-14: 1000 mg via ORAL
  Filled 2024-03-14: qty 2

## 2024-03-14 MED ORDER — CHLORHEXIDINE GLUCONATE CLOTH 2 % EX PADS
6.0000 | MEDICATED_PAD | Freq: Every day | CUTANEOUS | Status: DC
Start: 2024-03-15 — End: 2024-03-16
  Administered 2024-03-15 – 2024-03-16 (×2): 6 via TOPICAL
  Filled 2024-03-14: qty 6

## 2024-03-14 MED ORDER — ALBUTEROL SULFATE (2.5 MG/3ML) 0.083% IN NEBU
2.5000 mg | INHALATION_SOLUTION | Freq: Once | RESPIRATORY_TRACT | Status: DC
  Filled 2024-03-14: qty 3

## 2024-03-14 MED ORDER — SODIUM CHLORIDE 0.9 % IV SOLN
2.0000 g | Freq: Once | INTRAVENOUS | Status: AC
  Administered 2024-03-14: 2 g via INTRAVENOUS
  Filled 2024-03-14: qty 12.5

## 2024-03-14 MED ORDER — METRONIDAZOLE 500 MG/100ML IV SOLN
500.0000 mg | Freq: Once | INTRAVENOUS | Status: AC
  Administered 2024-03-14: 500 mg via INTRAVENOUS
  Filled 2024-03-14: qty 100

## 2024-03-14 MED ORDER — LACTATED RINGERS IV BOLUS (SEPSIS)
250.0000 mL | Freq: Once | INTRAVENOUS | Status: AC
  Administered 2024-03-14: 250 mL via INTRAVENOUS

## 2024-03-14 MED ORDER — ALBUTEROL SULFATE HFA 108 (90 BASE) MCG/ACT IN AERS
2.0000 | INHALATION_SPRAY | Freq: Once | RESPIRATORY_TRACT | Status: AC
Start: 2024-03-14 — End: 2024-03-14
  Administered 2024-03-14: 2 via RESPIRATORY_TRACT
  Filled 2024-03-14: qty 6.7

## 2024-03-14 MED ORDER — LACTATED RINGERS IV BOLUS (SEPSIS)
1000.0000 mL | Freq: Once | INTRAVENOUS | Status: AC
  Administered 2024-03-14: 1000 mL via INTRAVENOUS

## 2024-03-14 MED ORDER — NOREPINEPHRINE 4 MG/250ML-% IV SOLN
0.0000 ug/min | INTRAVENOUS | Status: DC
  Administered 2024-03-14: 2 ug/min via INTRAVENOUS
  Filled 2024-03-14: qty 250

## 2024-03-14 NOTE — ED Notes (Addendum)
 Placed patient on 2L [place] d/t oxygen saturation 88% on RA. Provider made aware.

## 2024-03-14 NOTE — Progress Notes (Incomplete)
 ***   NAME:  FATOU DUNNIGAN, MRN:  [id number], DOB:  02/07/1976, LOS: 0 ADMISSION DATE:  03/14/2024, CONSULTATION DATE:  03/14/24 REFERRING MD:  Geraldene MCDB CHIEF COMPLAINT:  Body Aches   History of Present Illness:  DASHAY GIESLER is a 48 y.o. female who has a PMH as outlined including but not to depression, seizures, cocaine abuse.  She presented to [facility] on 9/22 bodyaches all over.  She felt as if she had sepsis again which she had back in February 25 with ESBL E. coli bacteremia and UTI.  She denied any additional symptoms besides myalgias.  She had tympanic temperature of 101; however, oral temp was 98.2.  She was noted to be hypoxic to 91% on room air. Her lab work was completely negative including lactate 1.7, WBC 11.7.  She received 2.25 L off LR fluids but remained hypotensive with systolic blood pressures in the 80s.  Started on low-dose NE and required 2 mcg/min to maintain systolic blood pressure in the 100s.  She has received 1 dose of Vancomycin , Cefepime , Metronidazole  in the ED. UA showed large leukocytes with rare bacteria.  Due to low dose NE, PCCM called for admission to [facility].  Pertinent  Medical History:  has Ovarian cyst; Cocaine abuse (HCC); Smoking; Protein-calorie malnutrition, severe; Necrotizing pneumonia (HCC); Lung abscess (HCC); Solitary pulmonary nodule; COPD (chronic obstructive pulmonary disease) (HCC); Seizure disorder (HCC); Depression; Polysubstance abuse (HCC); Normocytic anemia; Acute hypoxic respiratory failure (HCC); Sepsis (HCC); UTI (urinary tract infection); Hypokalemia; COPD with acute exacerbation (HCC); UTI due to extended-spectrum beta lactamase (ESBL) producing Escherichia coli; SIRS (systemic inflammatory response syndrome) (HCC); Pulmonary nodules; Tobacco use; Erythrocytosis; Acute UTI (urinary tract infection); and AKI (acute kidney injury) on their problem list.  Significant Hospital Events: Including procedures, antibiotic start  and stop dates in addition to other pertinent events   9/22 admit  Interim History / Subjective:  ***  Objective:  Blood pressure (!) 124/96, pulse 85, temperature 98.2 F (36.8 C), temperature source Oral, resp. rate 20, last menstrual period 07/07/2022, SpO2 92%.        Intake/Output Summary (Last 24 hours) at 03/14/2024 2150 Last data filed at 03/14/2024 2026 Gross per 24 hour  Intake 1601.86 ml  Output --  Net 1601.86 ml   There were no vitals filed for this visit.   Physical Exam: General: *** Neuro: *** HEENT: *** Cardiovascular: *** Lungs: *** Abdomen: *** Musculoskeletal: *** Skin: *** GU: ***   Labs/imaging personally reviewed:  CXR 9/22 > emphysema.  Assessment & Plan:   Hypotension - presumed hypovolemic but with hx of cocaine use (unclear if any additional illicit substances), can't rule out cardiogenic component (no echo on file). NOT felt to represent sepsis at this point. All lab work thus far negative. Of note, she did receive 1 dose Vanc/Cefepime /Metronidazole  empirically while in ED and has been off of NE (was only on 2mcg/min previously) with SBP sustaining in 120 range. - Additional 1L LR now then MIVF at 150/hr. - Monitor UOP. - Check UDS. - Would get get echo for completeness and to assess for CHF/cardiomyopathy/etc. - Defer additional abx at this point. - Add on PCT. - Add on RVP, flu, COVID.  Acute hypoxic respiratory failure with hx underlying COPD per report. - Continue supplemental O2 as needed to maintain SpO2 > 90%. - DuoNebs scheduled, Albuterol  PRN.  Hx cocaine use. - Check UDS. - Cessation counseling as able.    Labs   CBC: Recent Labs  Lab  03/14/24 1508  WBC 11.7*  NEUTROABS 9.5*  HGB 15.0  HCT 44.5  MCV 89.7  PLT 255    Basic Metabolic Panel: Recent Labs  Lab 03/14/24 1508  NA 136  K 3.7  CL 99  CO2 24  GLUCOSE 214*  BUN 14  CREATININE 0.84  CALCIUM 10.0   GFR: CrCl cannot be calculated (Unknown ideal  weight.). Recent Labs  Lab 03/14/24 1508 03/14/24 1653  WBC 11.7*  --   LATICACIDVEN  --  1.7    Liver Function Tests: Recent Labs  Lab 03/14/24 1508  AST 25  ALT 18  ALKPHOS 84  BILITOT 0.6  PROT 8.0  ALBUMIN 4.6   No results for input(s): LIPASE, AMYLASE in the last 168 hours. No results for input(s): AMMONIA in the last 168 hours.  ABG    Component Value Date/Time   HCO3 28.8 (H) 02/01/2024 0229   TCO2 30 02/01/2024 0229   O2SAT 63 02/01/2024 0229     Coagulation Profile: Recent Labs  Lab 03/14/24 1508  INR 0.9    Cardiac Enzymes: No results for input(s): CKTOTAL, CKMB, CKMBINDEX, TROPONINI in the last 168 hours.  HbA1C: Hgb A1c MFr Bld  Date/Time Value Ref Range Status  03/08/2013 05:14 AM 5.3 <5.7 % Final    Comment:    (NOTE)                                                                       According to the ADA Clinical Practice Recommendations for 2011, when HbA1c is used as a screening test:  >=6.5%   Diagnostic of Diabetes Mellitus           (if abnormal result is confirmed) 5.7-6.4%   Increased risk of developing Diabetes Mellitus References:Diagnosis and Classification of Diabetes Mellitus,Diabetes Care,2011,34(Suppl 1):S62-S69 and Standards of Medical Care in         Diabetes - 2011,Diabetes Care,2011,34 (Suppl 1):S11-S61.    CBG: No results for input(s): GLUCAP in the last 168 hours.  Review of Systems:   ***  Past Medical History:  She,  has a past medical history of Cocaine abuse (HCC), Depression, Seizures (HCC), STD (female), and Substance abuse (HCC).   Surgical History:   Past Surgical History:  Procedure Laterality Date   right lung collapse       Social History:   reports that she has been smoking cigarettes. She has a 19 pack-year smoking history. She has never used smokeless tobacco. She reports current drug use. Drugs: Crack cocaine and Cocaine. She reports that she does not drink alcohol.    Family History:  Her family history is not on file.   Allergies Allergies  Allergen Reactions   Darvocet [Propoxyphene N-Acetaminophen ] Hives   Percocet [Oxycodone -Acetaminophen ] Hives     Home Medications  Prior to Admission medications   Medication Sig Start Date End Date Taking? Authorizing Provider  albuterol  (VENTOLIN  HFA) 108 (90 Base) MCG/ACT inhaler Inhale 1-2 puffs into the lungs every 6 (six) hours as needed for wheezing or shortness of breath. 01/29/23   Francella Rogue, MD     Critical care time: ***   Sammi Gore, PA - [facility] For pager details, please see AMION  or use Epic chat  After 1900, please call [facility] for cross coverage needs 03/14/2024, 9:50 PM

## 2024-03-14 NOTE — ED Triage Notes (Signed)
 Pt reports she woke up this morning hurting all over with fever and thinks she may have sepsis again because she has had sepsis multiple times in the past. No obvious resp distress in triage.

## 2024-03-14 NOTE — Progress Notes (Signed)
 Plan of Care Note for accepted transfer   Patient: Deanna Klein MRN: [id number]   DOA: 03/14/2024  Facility requesting transfer: [facility]   Requesting Provider: Dr. Zackowski   Reason for transfer: SIRS, possible sepsis d//t UTI   Facility course: 48 yr old female with hx of asthma, cocaine abuse, depression, seizures, and BMI 41 who presented with fever and aches.   She was found to be febrile and mildly tachycardic and tachypneic. SBP was 90s initially and later dropped to upper 70s-80s.   WBC is 11.7 and lactate normal x2. Covid/flu/RSV are negative. No acute findings noted on CXR. UA with rare bacteria, >50 WBC/hpf, and large leukocytes.   Blood and urine cultures were collected and patient was treated with 3.25 liters LR, vancomycin , cefepime , and Flagyl . She was briefly on Levophed  and initially accepted to ICU but now has stable BP off of Levophed . ED will update us  if she becomes hypotensive again prior to transfer.    Plan of care: The patient is accepted for admission to [facility] unit, at [facility].   Author: Evalene GORMAN Sprinkles, MD 03/14/2024  Check [email or web address] for on-call coverage.  Nursing staff, Please call [facility] Admits & Consults System-Wide number on Amion as soon as patient's arrival, so appropriate admitting provider can evaluate the pt.

## 2024-03-14 NOTE — ED Notes (Signed)
 RT in room to give ordered nebulizer. Patient states she cannot tolerate nebulized inhaler, but wants inhaler instead. EDP notified.

## 2024-03-14 NOTE — ED Provider Notes (Addendum)
 [place] EMERGENCY DEPARTMENT AT [facility] Provider Note   CSN: [id number] Arrival date & time: 03/14/24  1445     Patient presents with: Abdominal Pain   Deanna Klein is a 48 y.o. female.   Patient concerned about sepsis.  She been admitted twice this year for sepsis.  Patient with onset of fever today temp here 101.  And bodyaches.  Denies any upper respiratory symptoms any pain other than the body aches.  No nausea vomiting or diarrhea no shortness of breath.  Oxygen saturation here is 91%.  Patient does have a history of COPD.  History of substance abuse.  Patient's daily smoker.  Past medical history of depression substance abuse seizures cocaine abuse.  Patient admitted for sepsis on August 11.  Have asked patient 3 times denies any abdominal pain.  Never has had any abdominal pain in the last couple days.  And abdomen is soft and nontender       Prior to Admission medications   Medication Sig Start Date End Date Taking? Authorizing Provider  albuterol  (VENTOLIN  HFA) 108 (90 Base) MCG/ACT inhaler Inhale 1-2 puffs into the lungs every 6 (six) hours as needed for wheezing or shortness of breath. 01/29/23   Francella Rogue, MD    Allergies: Darvocet [propoxyphene n-acetaminophen ] and Percocet [oxycodone -acetaminophen ]    Review of Systems  Constitutional:  Positive for fever. Negative for chills.  HENT:  Negative for ear pain and sore throat.   Eyes:  Negative for pain and visual disturbance.  Respiratory:  Negative for cough and shortness of breath.   Cardiovascular:  Negative for chest pain and palpitations.  Gastrointestinal:  Negative for abdominal pain and vomiting.  Genitourinary:  Negative for dysuria and hematuria.  Musculoskeletal:  Positive for myalgias. Negative for arthralgias and back pain.  Skin:  Negative for color change and rash.  Neurological:  Negative for seizures and syncope.  All other systems reviewed and are negative.   Updated Vital  Signs BP (!) 85/64   Pulse 85   Temp (!) 101 F (38.3 C) (Tympanic)   Resp 18   LMP 07/07/2022 (Approximate)   SpO2 95%   Physical Exam Vitals and nursing note reviewed.  Constitutional:      General: She is not in acute distress.    Appearance: Normal appearance. She is well-developed. She is ill-appearing.  HENT:     Head: Normocephalic and atraumatic.     Mouth/Throat:     Mouth: Mucous membranes are dry.  Eyes:     Extraocular Movements: Extraocular movements intact.     Conjunctiva/sclera: Conjunctivae normal.     Pupils: Pupils are equal, round, and reactive to light.  Cardiovascular:     Rate and Rhythm: Regular rhythm. Tachycardia present.     Heart sounds: No murmur heard. Pulmonary:     Effort: Pulmonary effort is normal. No respiratory distress.     Breath sounds: Normal breath sounds.  Abdominal:     General: There is no distension.     Palpations: Abdomen is soft.     Tenderness: There is no abdominal tenderness. There is no guarding.     Comments: Abdomen is soft and nontender  Musculoskeletal:        General: No swelling.     Cervical back: Normal range of motion and neck supple.     Right lower leg: No edema.     Left lower leg: No edema.  Skin:    General: Skin is warm and dry.  Capillary Refill: Capillary refill takes less than 2 seconds.  Neurological:     General: No focal deficit present.     Mental Status: She is alert and oriented to person, place, and time.  Psychiatric:        Mood and Affect: Mood normal.     (all labs ordered are listed, but only abnormal results are displayed) Labs Reviewed  COMPREHENSIVE METABOLIC PANEL WITH GFR - Abnormal; Notable for the following components:      Result Value   Glucose, Bld 214 (*)    All other components within normal limits  CBC WITH DIFFERENTIAL/PLATELET - Abnormal; Notable for the following components:   WBC 11.7 (*)    Neutro Abs 9.5 (*)    All other components within normal limits   URINALYSIS, W/ REFLEX TO CULTURE (INFECTION SUSPECTED) - Abnormal; Notable for the following components:   Color, Urine COLORLESS (*)    Specific Gravity, Urine <1.005 (*)    Hgb urine dipstick TRACE (*)    Leukocytes,Ua LARGE (*)    Bacteria, UA RARE (*)    All other components within normal limits  RESP PANEL BY RT-PCR (RSV, FLU A&B, COVID)  [id number]  CULTURE, BLOOD (ROUTINE X 2)  CULTURE, BLOOD (ROUTINE X 2)  CULTURE, BLOOD (ROUTINE X 2)  CULTURE, BLOOD (ROUTINE X 2)  URINE CULTURE  LACTIC ACID, PLASMA  PROTIME-INR  LACTIC ACID, PLASMA  LACTIC ACID, PLASMA  COMPREHENSIVE METABOLIC PANEL WITH GFR  CBC WITH DIFFERENTIAL/PLATELET  PROTIME-INR    EKG: EKG Interpretation Date/Time:  Monday March 14 2024 17:11:44 EDT Ventricular Rate:  98 PR Interval:  131 QRS Duration:  81 QT Interval:  356 QTC Calculation: 455 R Axis:   77  Text Interpretation: Sinus rhythm Right atrial enlargement No significant change since last tracing Confirmed by Mccade Sullenberger [phone]) on 03/14/2024 5:22:01 PM  Radiology: DG Chest 2 View if patient is not in a treatment room. Result Date: 03/14/2024 CLINICAL DATA:  Suspected Sepsis EXAM: CHEST - 2 VIEW COMPARISON:  02/01/2024 FINDINGS: Emphysema. No focal airspace consolidation, pleural effusion, or pneumothorax. No cardiomegaly.No acute fracture or destructive lesion. IMPRESSION: Emphysema.  No acute cardiopulmonary abnormality. Electronically Signed   By: Rogelia Myers M.D.   On: 03/14/2024 15:49     Procedures   Medications Ordered in the ED  lactated ringers  infusion ( Intravenous New Bag/Given 03/14/24 1617)  lactated ringers  bolus 1,000 mL (has no administration in time range)  norepinephrine  (LEVOPHED ) 4mg  in (0.016 mg/mL) premix infusion (has no administration in time range)  lactated ringers  bolus 1,000 mL (0 mLs Intravenous Stopped 03/14/24 1702)    And  lactated ringers  bolus 250 mL (0 mLs Intravenous Stopped 03/14/24 1639)   ceFEPIme  (MAXIPIME ) 2 g in sodium chloride  0.9 % 100 mL IVPB (0 g Intravenous Stopped 03/14/24 1647)  metroNIDAZOLE  (FLAGYL ) IVPB 500 mg (0 mg Intravenous Stopped 03/14/24 1715)  vancomycin  (VANCOCIN ) IVPB 1000 mg/200 mL premix (1,000 mg Intravenous New Bag/Given 03/14/24 1730)  acetaminophen  (TYLENOL ) tablet 1,000 mg (1,000 mg Oral Given 03/14/24 1622)                                    Medical Decision Making Amount and/or Complexity of Data Reviewed Labs: ordered. Radiology: ordered.  Risk OTC drugs. Prescription drug management. Decision regarding hospitalization.   CRITICAL CARE Performed by: Amariah Kierstead Total critical care time: 60 minutes Critical care time was  exclusive of separately billable procedures and treating other patients. Critical care was necessary to treat or prevent imminent or life-threatening deterioration. Critical care was time spent personally by me on the following activities: development of treatment plan with patient and/or surrogate as well as nursing, discussions with consultants, evaluation of patient's response to treatment, examination of patient, obtaining history from patient or surrogate, ordering and performing treatments and interventions, ordering and review of laboratory studies, ordering and review of radiographic studies, pulse oximetry and re-evaluation of patient's condition.    Patient based on her vital sign presentation raises concerns for sepsis.  Temp 101 pulse 118 respirations 24 blood pressure 92/63.  Oxygen saturation 91%.  Will start her on a couple liters of oxygen.  Will begin sepsis protocol.  Patient's respiratory panel was negative.  Initial lactic acid was 1.7.  Again symptoms also started today.  Urinalysis urine culture is pending at greater than 50 whites rare bacteria.  Squames were only 0-5.  Blood cultures pending 2.  Complete metabolic panel blood sugar 214 otherwise all normal anion gap normal.  Renal function normal.   CBC white count 11.7 hemoglobin 15 platelets 255.  INR 0.9.  Chest x-ray emphysema but no acute cardiopulmonary abnormality.  Patient does not have oxygen at home.  Known to have COPD.  Here requiring 2 L of oxygen so there is a degree of hypoxia.  She is alert and oriented.  Patient's blood pressures despite initial resuscitation have dropped down now to 81 but the maps are still around 70.  But we will go ahead and give another liter of lactated Ringer 's.  She is very small.  And will start norepinephrine  drip and discussed with critical care.  No source currently other than maybe urine.  Again patient's abdomen soft she has no complaint abdominal pain no nausea vomiting.  Patient has had sepsis twice this year already.  This could be the third time.  Patient remaining stable on just a touch of the norepinephrine .  Discussed with critical care Dr. Harlene Na have put in admit orders for ICU admission.  Just called by critical care.  Unknown to me patient was removed from the pressor agents because her pressures were doing better.  Her systolics are in the low 100s.  Critical care has canceled the admission she did have a bed assigned and [facility] was coming.  I will have the clerk call [facility] not to come.  Requested [facility] to go ahead and get the hospitalist for me.  High probability patient may drop her blood pressures again.  Discussed with Dr. Sheran hospitalist.  He will put her in for stepdown bed.  I will have the overnight colleague follow her carefully because she may drop her pressures again and may have to go on pressor agents again.  Currently stable.    Final diagnoses:  Sepsis, due to unspecified organism, unspecified whether acute organ dysfunction present [facility])    ED Discharge Orders     None          Geraldene Hamilton, MD 03/14/24 1540    Geraldene Hamilton, MD 03/14/24 1655    Geraldene Hamilton, MD 03/14/24 JUDITHANN    Geraldene Hamilton, MD 03/14/24  [id number]    Geraldene Hamilton, MD 03/14/24 2230    Geraldene Hamilton, MD 03/14/24 2232

## 2024-03-14 NOTE — Sepsis Progress Note (Signed)
 Elink will follow per sepsis protocol.

## 2024-03-15 ENCOUNTER — Encounter (HOSPITAL_COMMUNITY): Payer: Self-pay | Admitting: Family Medicine

## 2024-03-15 DIAGNOSIS — Z87440 Personal history of urinary (tract) infections: Secondary | ICD-10-CM | POA: Diagnosis not present

## 2024-03-15 DIAGNOSIS — J439 Emphysema, unspecified: Secondary | ICD-10-CM | POA: Diagnosis present

## 2024-03-15 DIAGNOSIS — Z1152 Encounter for screening for COVID-19: Secondary | ICD-10-CM | POA: Diagnosis not present

## 2024-03-15 DIAGNOSIS — Z1612 Extended spectrum beta lactamase (ESBL) resistance: Secondary | ICD-10-CM | POA: Diagnosis present

## 2024-03-15 DIAGNOSIS — R0902 Hypoxemia: Secondary | ICD-10-CM | POA: Diagnosis present

## 2024-03-15 DIAGNOSIS — Z8619 Personal history of other infectious and parasitic diseases: Secondary | ICD-10-CM | POA: Diagnosis not present

## 2024-03-15 DIAGNOSIS — F17210 Nicotine dependence, cigarettes, uncomplicated: Secondary | ICD-10-CM | POA: Diagnosis present

## 2024-03-15 DIAGNOSIS — Z885 Allergy status to narcotic agent status: Secondary | ICD-10-CM | POA: Diagnosis not present

## 2024-03-15 DIAGNOSIS — B9629 Other Escherichia coli [E. coli] as the cause of diseases classified elsewhere: Secondary | ICD-10-CM | POA: Diagnosis not present

## 2024-03-15 DIAGNOSIS — R509 Fever, unspecified: Secondary | ICD-10-CM | POA: Diagnosis present

## 2024-03-15 DIAGNOSIS — R739 Hyperglycemia, unspecified: Secondary | ICD-10-CM | POA: Insufficient documentation

## 2024-03-15 DIAGNOSIS — Z5902 Unsheltered homelessness: Secondary | ICD-10-CM | POA: Diagnosis not present

## 2024-03-15 DIAGNOSIS — A419 Sepsis, unspecified organism: Secondary | ICD-10-CM | POA: Diagnosis present

## 2024-03-15 DIAGNOSIS — Z681 Body mass index (BMI) 19 or less, adult: Secondary | ICD-10-CM | POA: Diagnosis not present

## 2024-03-15 DIAGNOSIS — R030 Elevated blood-pressure reading, without diagnosis of hypertension: Secondary | ICD-10-CM | POA: Diagnosis present

## 2024-03-15 DIAGNOSIS — J4489 Other specified chronic obstructive pulmonary disease: Secondary | ICD-10-CM | POA: Diagnosis present

## 2024-03-15 DIAGNOSIS — Z66 Do not resuscitate: Secondary | ICD-10-CM | POA: Diagnosis present

## 2024-03-15 DIAGNOSIS — Z23 Encounter for immunization: Secondary | ICD-10-CM | POA: Diagnosis present

## 2024-03-15 DIAGNOSIS — R5381 Other malaise: Secondary | ICD-10-CM | POA: Diagnosis present

## 2024-03-15 DIAGNOSIS — N390 Urinary tract infection, site not specified: Secondary | ICD-10-CM | POA: Diagnosis present

## 2024-03-15 DIAGNOSIS — F32A Depression, unspecified: Secondary | ICD-10-CM | POA: Diagnosis present

## 2024-03-15 DIAGNOSIS — F1410 Cocaine abuse, uncomplicated: Secondary | ICD-10-CM

## 2024-03-15 DIAGNOSIS — E43 Unspecified severe protein-calorie malnutrition: Secondary | ICD-10-CM | POA: Diagnosis present

## 2024-03-15 DIAGNOSIS — B9620 Unspecified Escherichia coli [E. coli] as the cause of diseases classified elsewhere: Secondary | ICD-10-CM | POA: Diagnosis present

## 2024-03-15 LAB — COMPREHENSIVE METABOLIC PANEL WITH GFR
ALT: 11 U/L (ref 0–44)
AST: 17 U/L (ref 15–41)
Albumin: 3.4 g/dL — ABNORMAL LOW (ref 3.5–5.0)
Alkaline Phosphatase: 71 U/L (ref 38–126)
Anion gap: 10 (ref 5–15)
BUN: 17 mg/dL (ref 6–20)
CO2: 24 mmol/L (ref 22–32)
Calcium: 8.8 mg/dL — ABNORMAL LOW (ref 8.9–10.3)
Chloride: 104 mmol/L (ref 98–111)
Creatinine, Ser: 0.68 mg/dL (ref 0.44–1.00)
GFR, Estimated: >60 mL/min (ref >60)
Glucose, Bld: 111 mg/dL — ABNORMAL HIGH (ref 70–99)
Potassium: 3.8 mmol/L (ref 3.5–5.1)
Sodium: 139 mmol/L (ref 135–145)
Total Bilirubin: 0.4 mg/dL (ref 0.0–1.2)
Total Protein: 5.8 g/dL — ABNORMAL LOW (ref 6.5–8.1)

## 2024-03-15 LAB — CBC
HCT: 39.3 % (ref 36.0–46.0)
Hemoglobin: 12.9 g/dL (ref 12.0–15.0)
MCH: 30.7 pg (ref 26.0–34.0)
MCHC: 32.8 g/dL (ref 30.0–36.0)
MCV: 93.6 fL (ref 80.0–100.0)
Platelets: 196 K/uL (ref 150–400)
RBC: 4.2 MIL/uL (ref 3.87–5.11)
RDW: 12.9 % (ref 11.5–15.5)
WBC: 10.9 K/uL — ABNORMAL HIGH (ref 4.0–10.5)
nRBC: 0 % (ref 0.0–0.2)

## 2024-03-15 LAB — HEMOGLOBIN A1C
Hgb A1c MFr Bld: 5.2 % (ref 4.8–5.6)
Mean Plasma Glucose: 102.54 mg/dL

## 2024-03-15 LAB — PROCALCITONIN: Procalcitonin: <0.1 ng/mL

## 2024-03-15 LAB — HIV ANTIBODY (ROUTINE TESTING W REFLEX): HIV Screen 4th Generation wRfx: NONREACTIVE

## 2024-03-15 LAB — CREATININE, SERUM
Creatinine, Ser: 0.7 mg/dL (ref 0.44–1.00)
GFR, Estimated: >60 mL/min (ref >60)

## 2024-03-15 LAB — MRSA NEXT GEN BY PCR, NASAL: MRSA by PCR Next Gen: DETECTED — AB

## 2024-03-15 MED ORDER — ENSURE PLUS HIGH PROTEIN PO LIQD
237.0000 mL | Freq: Three times a day (TID) | ORAL | Status: DC
  Administered 2024-03-15 – 2024-03-17 (×6): 237 mL via ORAL

## 2024-03-15 MED ORDER — HYDRALAZINE HCL 20 MG/ML IJ SOLN: 5.0000 mg | INTRAMUSCULAR | Status: DC | PRN

## 2024-03-15 MED ORDER — PNEUMOCOCCAL 20-VAL CONJ VACC 0.5 ML IM SUSY
0.5000 mL | PREFILLED_SYRINGE | INTRAMUSCULAR | Status: AC
  Administered 2024-03-16: 0.5 mL via INTRAMUSCULAR
  Filled 2024-03-15: qty 0.5

## 2024-03-15 MED ORDER — ACETAMINOPHEN 325 MG PO TABS
650.0000 mg | ORAL_TABLET | Freq: Four times a day (QID) | ORAL | Status: DC | PRN
  Administered 2024-03-16: 650 mg via ORAL
  Filled 2024-03-15: qty 2

## 2024-03-15 MED ORDER — ALBUTEROL SULFATE HFA 108 (90 BASE) MCG/ACT IN AERS: 1.0000 | INHALATION_SPRAY | Freq: Four times a day (QID) | RESPIRATORY_TRACT | Status: DC | PRN

## 2024-03-15 MED ORDER — ADULT MULTIVITAMIN W/MINERALS CH
1.0000 | ORAL_TABLET | Freq: Every day | ORAL | Status: DC
  Administered 2024-03-15 – 2024-03-17 (×3): 1 via ORAL
  Filled 2024-03-15 (×3): qty 1

## 2024-03-15 MED ORDER — NICOTINE 14 MG/24HR TD PT24
14.0000 mg | MEDICATED_PATCH | Freq: Every day | TRANSDERMAL | Status: DC
  Administered 2024-03-15 – 2024-03-17 (×3): 14 mg via TRANSDERMAL
  Filled 2024-03-15 (×3): qty 1

## 2024-03-15 MED ORDER — ACETAMINOPHEN 650 MG RE SUPP: 650.0000 mg | Freq: Four times a day (QID) | RECTAL | Status: DC | PRN

## 2024-03-15 MED ORDER — ALBUTEROL SULFATE (2.5 MG/3ML) 0.083% IN NEBU: 2.5000 mg | INHALATION_SOLUTION | Freq: Four times a day (QID) | RESPIRATORY_TRACT | Status: DC | PRN

## 2024-03-15 MED ORDER — ENOXAPARIN SODIUM 30 MG/0.3ML IJ SOSY
30.0000 mg | PREFILLED_SYRINGE | INTRAMUSCULAR | Status: DC
  Filled 2024-03-15 (×2): qty 0.3

## 2024-03-15 MED ORDER — SODIUM CHLORIDE 0.9 % IV SOLN
1.0000 g | Freq: Three times a day (TID) | INTRAVENOUS | Status: DC
  Administered 2024-03-15 – 2024-03-17 (×8): 1 g via INTRAVENOUS
  Filled 2024-03-15 (×8): qty 20

## 2024-03-15 NOTE — H&P (Signed)
 History and Physical    ALBIRTA RHINEHART FMW:[id number] DOB: 02/10/1976 DOA: 03/14/2024  Patient coming from: Home.  Chief Complaint: Fever chills.  HPI: Deanna Klein is a 48 y.o. female with history of ESBL UTI, COPD, substance abuse, prior history of seizures recently admitted in August last month for possible sepsis from UTI presents to the ER with complaints of fever and chills since yesterday morning but denies any associated shortness of breath productive cough nausea vomiting or diarrhea.  ED Course: In ER patient was tachycardic and transiently hypotensive.  Plan was to start Levophed  but blood pressure improved.  UA is concerning for UTI and started on empiric antibiotics admitted for sepsis secondary to UTI.  Labs show glucose of 214.  WBC 11.7.  COVID and flu test were negative chest x-ray unremarkable.  Review of Systems: As per HPI, rest all negative.   Past Medical History:  Diagnosis Date   Cocaine abuse (HCC)    Depression    Seizures (HCC)    STD (female)    Substance abuse (HCC)    crack cocaine    Past Surgical History:  Procedure Laterality Date   right lung collapse       reports that she has been smoking cigarettes. She has a 19 pack-year smoking history. She has never used smokeless tobacco. She reports current drug use. Drugs: Crack cocaine and Cocaine. She reports that she does not drink alcohol.  Allergies  Allergen Reactions   Darvocet [Propoxyphene N-Acetaminophen ] Hives   Percocet [Oxycodone -Acetaminophen ] Hives    History reviewed. No pertinent family history.  Prior to Admission medications   Medication Sig Start Date End Date Taking? Authorizing Provider  albuterol  (VENTOLIN  HFA) 108 (90 Base) MCG/ACT inhaler Inhale 1-2 puffs into the lungs every 6 (six) hours as needed for wheezing or shortness of breath. 01/29/23   Francella Rogue, MD    Physical Exam: Constitutional: Moderately built and appears malnourished. Vitals:   03/14/24 2315  03/14/24 2325 03/15/24 0030 03/15/24 0108  BP: (!) 151/93 104/87 (!) 158/93 (!) 172/108  Pulse: 74 99 92 (!) 103  Resp: 19 (!) 25 20 (!) 24  Temp:  99.1 F (37.3 C)  99.7 F (37.6 C)  TempSrc:  Oral  Axillary  SpO2: 99% 97% 96%   Weight:    40.6 kg  Height:    5' 4 (1.626 m)   Eyes: Anicteric no pallor. ENMT: No discharge from the ears eyes nose or mouth. Neck: No mass felt.  No neck rigidity. Respiratory: No rhonchi or crepitations. Cardiovascular: S1 S2 heard. Abdomen: Soft nontender bowel sound present. Musculoskeletal: No edema. Skin: No rash. Neurologic: Alert awake oriented time place and person.  Moves all extremities. Psychiatric: Appears normal.  Normal affect.   Labs on Admission: I have personally reviewed following labs and imaging studies  CBC: Recent Labs  Lab 03/14/24 1508  WBC 11.7*  NEUTROABS 9.5*  HGB 15.0  HCT 44.5  MCV 89.7  PLT 255   Basic Metabolic Panel: Recent Labs  Lab 03/14/24 1508  NA 136  K 3.7  CL 99  CO2 24  GLUCOSE 214*  BUN 14  CREATININE 0.84  CALCIUM 10.0   GFR: Estimated Creatinine Clearance: 51.4 mL/min (by C-G formula based on SCr of 0.84 mg/dL). Liver Function Tests: Recent Labs  Lab 03/14/24 1508  AST 25  ALT 18  ALKPHOS 84  BILITOT 0.6  PROT 8.0  ALBUMIN 4.6   No results for input(s): LIPASE, AMYLASE in the  last 168 hours. No results for input(s): AMMONIA in the last 168 hours. Coagulation Profile: Recent Labs  Lab 03/14/24 1508  INR 0.9   Cardiac Enzymes: No results for input(s): CKTOTAL, CKMB, CKMBINDEX, TROPONINI in the last 168 hours. BNP (last 3 results) No results for input(s): PROBNP in the last 8760 hours. HbA1C: No results for input(s): HGBA1C in the last 72 hours. CBG: No results for input(s): GLUCAP in the last 168 hours. Lipid Profile: No results for input(s): CHOL, HDL, LDLCALC, TRIG, CHOLHDL, LDLDIRECT in the last 72 hours. Thyroid Function Tests: No  results for input(s): TSH, T4TOTAL, FREET4, T3FREE, THYROIDAB in the last 72 hours. Anemia Panel: No results for input(s): VITAMINB12, FOLATE, FERRITIN, TIBC, IRON, RETICCTPCT in the last 72 hours. Urine analysis:    Component Value Date/Time   COLORURINE COLORLESS (A) 03/14/2024 1640   APPEARANCEUR CLEAR 03/14/2024 1640   LABSPEC <1.005 (L) 03/14/2024 1640   PHURINE 6.0 03/14/2024 1640   GLUCOSEU NEGATIVE 03/14/2024 1640   HGBUR TRACE (A) 03/14/2024 1640   BILIRUBINUR NEGATIVE 03/14/2024 1640   KETONESUR NEGATIVE 03/14/2024 1640   PROTEINUR NEGATIVE 03/14/2024 1640   UROBILINOGEN 1.0 03/31/2011 2207   NITRITE NEGATIVE 03/14/2024 1640   LEUKOCYTESUR LARGE (A) 03/14/2024 1640   Sepsis Labs: @LABRCNTIP (procalcitonin:4,lacticidven:4) ) Recent Results (from the past 240 hours)  Urine Culture     Status: None (Preliminary result)   Collection Time: 03/14/24  4:40 PM   Specimen: Urine, Clean Catch  Result Value Ref Range Status   Specimen Description   Final    URINE, CLEAN CATCH Performed at [facility], [address] [place], [place] [address]    Special Requests   Final    NONE Reflexed from [id number] Performed at [facility], [address], [place], [place] [address]    Culture PENDING  Incomplete   Report Status PENDING  Incomplete  Resp panel by RT-PCR (RSV, Flu A&B, Covid)     Status: None   Collection Time: 03/14/24  5:44 PM  Result Value Ref Range Status   SARS Coronavirus 2 by RT PCR NEGATIVE NEGATIVE Final    Comment: (NOTE) SARS-CoV-2 target nucleic acids are NOT DETECTED.  The SARS-CoV-2 RNA is generally detectable in upper respiratory specimens during the acute phase of infection. The lowest concentration of SARS-CoV-2 viral copies this assay can detect is 138 copies/mL. A negative result does not preclude SARS-Cov-2 infection and should not be used as the sole basis for treatment or other patient management  decisions. A negative result may occur with  improper specimen collection/handling, submission of specimen other than nasopharyngeal swab, presence of viral mutation(s) within the areas targeted by this assay, and inadequate number of viral copies(<138 copies/mL). A negative result must be combined with clinical observations, patient history, and epidemiological information. The expected result is Negative.  Fact Sheet for Patients:  BloggerCourse.com  Fact Sheet for Healthcare Providers:  SeriousBroker.it  This test is no t yet approved or cleared by the [place]  FDA and  has been authorized for detection and/or diagnosis of SARS-CoV-2 by FDA under an Emergency Use Authorization (EUA). This EUA will remain  in effect (meaning this test can be used) for the duration of the COVID-19 declaration under Section 564(b)(1) of the Act, 21 U.S.C.section 360bbb-3(b)(1), unless the authorization is terminated  or revoked sooner.       Influenza A by PCR NEGATIVE NEGATIVE Final   Influenza B by PCR NEGATIVE NEGATIVE Final    Comment: (NOTE)  The Xpert Xpress SARS-CoV-2/FLU/RSV plus assay is intended as an aid in the diagnosis of influenza from Nasopharyngeal swab specimens and should not be used as a sole basis for treatment. Nasal washings and aspirates are unacceptable for Xpert Xpress SARS-CoV-2/FLU/RSV testing.  Fact Sheet for Patients: BloggerCourse.com  Fact Sheet for Healthcare Providers: SeriousBroker.it  This test is not yet approved or cleared by the [place]  FDA and has been authorized for detection and/or diagnosis of SARS-CoV-2 by FDA under an Emergency Use Authorization (EUA). This EUA will remain in effect (meaning this test can be used) for the duration of the COVID-19 declaration under Section 564(b)(1) of the Act, 21 U.S.C. section 360bbb-3(b)(1), unless the  authorization is terminated or revoked.     Resp Syncytial Virus by PCR NEGATIVE NEGATIVE Final    Comment: (NOTE) Fact Sheet for Patients: BloggerCourse.com  Fact Sheet for Healthcare Providers: SeriousBroker.it  This test is not yet approved or cleared by the [place]  FDA and has been authorized for detection and/or diagnosis of SARS-CoV-2 by FDA under an Emergency Use Authorization (EUA). This EUA will remain in effect (meaning this test can be used) for the duration of the COVID-19 declaration under Section 564(b)(1) of the Act, 21 U.S.C. section 360bbb-3(b)(1), unless the authorization is terminated or revoked.  Performed at [facility], [address] [place], [place] [address]      Radiological Exams on Admission: DG Chest 2 View if patient is not in a treatment room. Result Date: 03/14/2024 CLINICAL DATA:  Suspected Sepsis EXAM: CHEST - 2 VIEW COMPARISON:  02/01/2024 FINDINGS: Emphysema. No focal airspace consolidation, pleural effusion, or pneumothorax. No cardiomegaly.No acute fracture or destructive lesion. IMPRESSION: Emphysema.  No acute cardiopulmonary abnormality. Electronically Signed   By: Rogelia Myers M.D.   On: 03/14/2024 15:49    EKG: Independently reviewed.  Normal sinus rhythm.  Assessment/Plan Principal Problem:   Sepsis (HCC) Active Problems:   Cocaine abuse (HCC)   COPD (chronic obstructive pulmonary disease) (HCC)   SIRS (systemic inflammatory response syndrome) (HCC)   Elevated blood pressure reading    Possible sepsis likely source UTI.  Has history of ESBL so will place patient on meropenem .  Follow cultures.  Continue hydration. History of COPD presently not wheezing.  Continue as needed albuterol  which patient takes at home. Substance abuse cocaine positive in the urine drug screen.  Advised about quitting.  Social work consult. Severe protein calorie  malnutrition.  BMI 15.  Consult nutritionist. Elevated blood pressure readings.  Initial readings were concerning for possible sepsis with hypotension improving with fluids and presently elevated.  Follow blood pressure trends.  As needed IV hydralazine . Hyperglycemia check hemoglobin A1c.  Since patient has possible sepsis will need close monitoring further workup and more than 2 midnight stay.   DVT prophylaxis: Lovenox . Code Status: Full code. Family Communication: Discussed with patient. Disposition Plan: Monitored bed. Consults called: Child psychotherapist.  Nutritionist. Admission status: Inpatient.

## 2024-03-15 NOTE — Progress Notes (Signed)
 Initial Nutrition Assessment  DOCUMENTATION CODES:   Severe malnutrition in context of chronic illness  INTERVENTION:  - Regular diet.  - Ensure Plus High Protein po TID, each supplement provides 350 kcal and 20 grams of protein. - Magic cup TID with meals, each supplement provides 290 kcal and 9 grams of protein - Multivitamin with minerals daily. - Monitor weight trends.  NUTRITION DIAGNOSIS:   Severe Malnutrition related to chronic illness (COPD; substance abuse) as evidenced by severe fat depletion, severe muscle depletion.  GOAL:   Patient will meet greater than or equal to 90% of their needs  MONITOR:   PO intake, Supplement acceptance, Weight trends  REASON FOR ASSESSMENT:   Consult Assessment of nutrition requirement/status  ASSESSMENT:   48 y.o. homeless female with PMH of ESBL UTI, COPD, substance abuse, seizures, recent admission for sepsis from UTI who presented with fever, chills from home. Admitted for sepsis d/t UTI.   Patient reports she is unsure of a UBW but does not feel she has lost any weight recently. Per EMR, weight has been stable at 89# since at least February.  Patient noted to be homeless. However, she reports eating 2-3 meals a day provided to her by her mother.   Her current appetite is very good and patient had finished breakfast and lunch prior to 11:30am today.  She has tried Ensure before and agreeable to receive during admission, likes chocolate. She is also agreeable to try [employer].    Medications reviewed and include: -  Labs reviewed:  -   NUTRITION - FOCUSED PHYSICAL EXAM:  Flowsheet Row Most Recent Value  Orbital Region Moderate depletion  Upper Arm Region Severe depletion  Thoracic and Lumbar Region Severe depletion  Buccal Region Moderate depletion  Temple Region Severe depletion  Clavicle Bone Region Severe depletion  Clavicle and Acromion Bone Region Severe depletion  Scapular Bone Region Unable to assess  Dorsal  Hand Unable to assess  Patellar Region Severe depletion  Anterior Thigh Region Severe depletion  Posterior Calf Region Moderate depletion  Edema (RD Assessment) None  Hair Reviewed  Eyes Reviewed  Mouth Reviewed  Skin Reviewed  Nails Reviewed    Diet Order:   Diet Order             Diet regular Room service appropriate? Yes; Fluid consistency: Thin  Diet effective now                   EDUCATION NEEDS:  Education needs have been addressed  Skin:  Skin Assessment: Reviewed RN Assessment  Last BM:  PTA  Height:  Ht Readings from Last 1 Encounters:  03/15/24 5' 4 (1.626 m)   Weight:  Wt Readings from Last 1 Encounters:  03/15/24 40.6 kg    BMI:  Body mass index is 15.36 kg/m.  Estimated Nutritional Needs:  Kcal:  1500-1650 kcals Protein:  70-80 grams Fluid:  >/= 1.5L    Trude Ned RD, LDN Contact via Secure Chat.

## 2024-03-15 NOTE — Progress Notes (Signed)
 Brief same day note:    48 year old homeless female with history of ESBL UTI, COPD, substance abuse, seizures, recent admission for sepsis from UTI who presented with fever, chills from home.  On presentation, she was in sinus tachycardia, transiently hypotensive.  Initial plan was to start on vasopressors but blood pressure improved with IV fluid. UA was concerning for  UTI.  Started on empiric antibiotics.  Pending urine culture/blood culture.  Patient seen and examined at bedside today.  Hemodynamically stable.  Afebrile.  Alert and oriented.  Looks very deconditioned, malnourished.  She says she lives in a tent.  She follow-up with her PCP.  She denies any new complaints today.  She states she snorts cocaine.  Assessment and plan:  Sepsis secondary to UTI: Presented with fever, chills, leukocytosis.  Lactic acid normal, procalcitonin reassuring.  History of ESBL UTI with sepsis.  Currently on meropenem .  Follow-up blood cultures. Sepsis physiology has improved, currently afebrile.  History of COPD/tabacco use: Currently not wheezing.  Continue bronchodilators as needed.  Smokes cigarettes daily, does not count the number.  Will start on nicotine  patch.  History of substance abuse: Cocaine positive in UDS.  Snorts cocaine.  Advised quitting drugs.  TOC consulted  Hypotension: Currently blood pressure stable.  Hyperglycemia: A1c 5.2  Severe protein calorie malnutrition: Nutritionist consulted  Deconditioning/debility/homelessness: Will consult PT.  TOC will be consulted

## 2024-03-15 NOTE — Progress Notes (Addendum)
 PT Note  Patient Details Name: Deanna Klein MRN: [id number] DOB: 23-Aug-1975   Cancelled Treatment:    Reason Eval/Treat Not Completed: Other (comment). PT arrived 1726 and pt sleeping. Nurse tech and nurse made PT aware pt is able to navigate in room IND.  PT needs noted. PT to sign off. Thank you for this referral.   [place], PT Acute Rehab   [place] VEAR Drone 03/15/2024, 5:30 PM

## 2024-03-15 NOTE — Progress Notes (Signed)
 Patient arrived with Albuterol  Sulfate inhaler at bedside. Nurse explained belongings/medication policy and asked pt to sign medication form for pharmacy to verify medication. Patient refused to sign and stated the hospital was trying to steal her medications and requested all of her belongings stay with her in her bed. RN tried to further explain policy to patient at bedside with MD but patient continued to state we were trying to steal her medications. She agreed to have medication sent to pharmacy but refused to sign form. Medication forms signed by Ward Hurst, RN and Nidia Hint, RN as witness.

## 2024-03-15 NOTE — Plan of Care (Signed)
  Problem: Elimination: Goal: Will not experience complications related to urinary retention Outcome: Progressing   Problem: Skin Integrity: Goal: Risk for impaired skin integrity will decrease Outcome: Progressing   Problem: Clinical Measurements: Goal: Respiratory complications will improve Outcome: Not Progressing   Problem: Activity: Goal: Risk for activity intolerance will decrease Outcome: Not Progressing   Problem: Nutrition: Goal: Adequate nutrition will be maintained Outcome: Not Progressing

## 2024-03-16 DIAGNOSIS — A419 Sepsis, unspecified organism: Secondary | ICD-10-CM | POA: Diagnosis not present

## 2024-03-16 LAB — URINE CULTURE: Culture: >=100000 — AB

## 2024-03-16 MED ORDER — ALBUTEROL SULFATE (2.5 MG/3ML) 0.083% IN NEBU: 3.0000 mL | INHALATION_SOLUTION | Freq: Four times a day (QID) | RESPIRATORY_TRACT | Status: DC | PRN

## 2024-03-16 MED ORDER — MUPIROCIN 2 % EX OINT
1.0000 | TOPICAL_OINTMENT | Freq: Two times a day (BID) | CUTANEOUS | Status: DC
Start: 2024-03-16 — End: 2024-03-21
  Administered 2024-03-16: 1 via NASAL
  Filled 2024-03-16: qty 22

## 2024-03-16 MED ORDER — CHLORHEXIDINE GLUCONATE CLOTH 2 % EX PADS
6.0000 | MEDICATED_PAD | Freq: Every day | CUTANEOUS | Status: DC
Start: 2024-03-16 — End: 2024-03-21

## 2024-03-16 NOTE — Plan of Care (Signed)
   Problem: Clinical Measurements: Goal: Will remain free from infection Outcome: Progressing Goal: Diagnostic test results will improve Outcome: Progressing

## 2024-03-16 NOTE — Progress Notes (Signed)
 Home medication returned to patient per RN request.

## 2024-03-16 NOTE — TOC Initial Note (Signed)
 Transition of Care [facility]) - Initial/Assessment Note    Patient Details  Name: Deanna Klein MRN: [id number] Date of Birth: 1976/01/13  Transition of [facility]) CM/SW Contact:    Alfonse JONELLE Rex, RN Phone Number: 03/16/2024, 11:22 AM  Clinical Narrative:  Met with patient at bedside to introduce role of TOC/NCM and review for dc planning, patient reports she has an established PCP, reports she resides in a tent with plans to return at discharge, currently uses no AD. TOC consult for Substance Abuse resources, patient declined, states all she needs is a prescription for Albuterol  and Nicotine  Patch at discharge. TOC will continue to follow.                  Expected Discharge Plan: Home/Self Care     Patient Goals and CMS Choice Patient states their goals for this hospitalization and ongoing recovery are:: patient reports she resides in a tent, plans to return at discharge          Expected Discharge Plan and Services       Living arrangements for the past 2 months: Homeless (resides in a tent)                                      Prior Living Arrangements/Services Living arrangements for the past 2 months: Homeless (resides in a tent)   Patient language and need for interpreter reviewed:: Yes Do you feel safe going back to the place where you live?: Yes      Need for Family Participation in Patient Care: Yes (Comment) Care giver support system in place?: Yes (comment)   Criminal Activity/Legal Involvement Pertinent to Current Situation/Hospitalization: No - Comment as needed  Activities of Daily Living   ADL Screening (condition at time of admission) Independently performs ADLs?: Yes (appropriate for developmental age) Is the patient deaf or have difficulty hearing?: No Does the patient have difficulty seeing, even when wearing glasses/contacts?: No Does the patient have difficulty concentrating, remembering, or making decisions?: No  Permission  Sought/Granted                  Emotional Assessment Appearance:: Appears stated age Attitude/Demeanor/Rapport: Engaged Affect (typically observed): Accepting Orientation: : Oriented to Self, Oriented to Place, Oriented to  Time, Oriented to Situation Alcohol / Substance Use: Illicit Drugs, Tobacco Use Psych Involvement: No (comment)  Admission diagnosis:  SIRS (systemic inflammatory response syndrome) (HCC) [R65.10] Sepsis (HCC) [A41.9] Sepsis, due to unspecified organism, unspecified whether acute organ dysfunction present [facility]) [A41.9] Patient Active Problem List   Diagnosis Date Noted   Elevated blood pressure reading 03/15/2024   Hyperglycemia 03/15/2024   SIRS (systemic inflammatory response syndrome) (HCC) 02/01/2024   Pulmonary nodules 02/01/2024   Tobacco use 02/01/2024   Erythrocytosis 02/01/2024   Acute UTI (urinary tract infection) 02/01/2024   AKI (acute kidney injury) 02/01/2024   UTI due to extended-spectrum beta lactamase (ESBL) producing Escherichia coli 08/21/2023   COPD with acute exacerbation (HCC) 01/29/2023   Acute hypoxic respiratory failure (HCC) 01/28/2023   Sepsis (HCC) 01/28/2023   UTI (urinary tract infection) 01/28/2023   Hypokalemia 01/28/2023   Necrotizing pneumonia (HCC) 02/02/2014   Lung abscess (HCC) 02/02/2014   Solitary pulmonary nodule 02/02/2014   COPD (chronic obstructive pulmonary disease) (HCC) 02/02/2014   Seizure disorder (HCC) 02/02/2014   Depression 02/02/2014   Polysubstance abuse (HCC) 02/02/2014   Normocytic anemia 02/02/2014  Protein-calorie malnutrition, severe 03/07/2013   Cocaine abuse (HCC) 03/06/2013   Smoking 03/06/2013   Ovarian cyst 05/30/2011   PCP:  [facility]:   [id number] [place], [place] - [address] [place] [address] Phone: [phone] Fax: [phone]  [facility] - [place], [place] - [address] [place] [address] Phone: [phone] Fax: [phone]  [facility] [address] [place] [address] Phone: [phone] Fax: [phone]     Social Drivers of Health (SDOH) Social History: SDOH Screenings   Food Insecurity: Unknown (03/15/2024)  Housing: High Risk (03/15/2024)  Transportation Needs: No Transportation Needs (03/15/2024)  Utilities: Patient Declined (03/15/2024)  Tobacco Use: High Risk (03/15/2024)   SDOH Interventions:     Readmission Risk Interventions    03/16/2024   11:20 AM  Readmission Risk Prevention Plan  Transportation Screening Complete  PCP or Specialist Appt within 5-7 Days Complete  Home Care Screening Complete  Medication Review (RN CM) Complete

## 2024-03-16 NOTE — Plan of Care (Signed)

## 2024-03-16 NOTE — Progress Notes (Signed)
 PROGRESS NOTE  ASEES MANFREDI  FMW:[id number] DOB: [birth year] DOA: 03/14/2024 PCP: [facility]   Brief Narrative: Patient is a 48 year old homeless female with history of ESBL UTI, COPD, substance abuse, seizures, recent admission for sepsis from UTI who presented with fever, chills from home.  On presentation, she was in sinus tachycardia, transiently hypotensive.  Initial plan was to start on vasopressors but blood pressure improved with IV fluid. UA was concerning for  UTI.  Started on empiric antibiotics.  Urine culture showed ESBL E. coli.  Currently on meropenem , plan to complete 5 days course before discharge.  Assessment & Plan:  Principal Problem:   Sepsis (HCC) Active Problems:   Cocaine abuse (HCC)   COPD (chronic obstructive pulmonary disease) (HCC)   UTI (urinary tract infection)   SIRS (systemic inflammatory response syndrome) (HCC)   Elevated blood pressure reading   Hyperglycemia    ESBL UTI: Presented with fever, chills, leukocytosis.  Lactic acid normal, procalcitonin reassuring.  History of ESBL UTI with sepsis.  Currently on meropenem .  Blood cultures have been negative. Sepsis physiology has improved, currently afebrile. Urine culture showed ESBL E. coli.  Plan for 5 days of meropenem .  History of COPD/tabacco use: Currently not wheezing.  Continue bronchodilators as needed.  Smokes cigarettes daily, does not count the number.  Currently on nicotine  patch.  History of substance abuse: Cocaine positive in UDS.  Snorts cocaine.  Advised quitting drugs.  TOC consulted  Hypotension: Currently blood pressure stable.  Hyperglycemia: A1c 5.2  Severe protein calorie malnutrition: Nutritionist consulted and following  Deconditioning/debility/homelessness: PT consulted.  Lives in a tent.  TOC consulted   Nutrition Problem: Severe Malnutrition Etiology: chronic illness (COPD; substance abuse)    DVT prophylaxis:enoxaparin  (LOVENOX ) injection 30 mg  Start: 03/15/24 1000     Code Status: Full Code  Family Communication: None at the bedside  Patient status: Inpatient  Patient is from : Home  Anticipated discharge to: Home  Estimated DC date: After finishing of meropenem    Consultants: None  Procedures:None  Antimicrobials:  Anti-infectives (From admission, onward)    Start     Dose/Rate Route Frequency Ordered Stop   03/15/24 0400  meropenem  (MERREM ) 1 g in sodium chloride  0.9 % 100 mL IVPB        1 g 200 mL/hr over 30 Minutes Intravenous Every 8 hours 03/15/24 0210     03/14/24 1545  ceFEPIme  (MAXIPIME ) 2 g in sodium chloride  0.9 % 100 mL IVPB        2 g 200 mL/hr over 30 Minutes Intravenous  Once 03/14/24 1533 03/14/24 1647   03/14/24 1545  metroNIDAZOLE  (FLAGYL ) IVPB 500 mg        500 mg 100 mL/hr over 60 Minutes Intravenous  Once 03/14/24 1533 03/14/24 1715   03/14/24 1545  vancomycin  (VANCOCIN ) IVPB 1000 mg/200 mL premix        1,000 mg 200 mL/hr over 60 Minutes Intravenous  Once 03/14/24 1533 03/14/24 1830       Subjective: Patient seen and examined at bedside today.  Comfortably lying on bed.  Denies new complaints.  Afebrile, hemodynamically stable.  No dysuria  Objective: Vitals:   03/15/24 1239 03/15/24 2115 03/16/24 0215 03/16/24 0647  BP: (!) 178/118 108/79 101/76 106/89  Pulse: (!) 102 (!) 108 99 81  Resp:  18 18 18   Temp:  98.4 F (36.9 C) 98.7 F (37.1 C)   TempSrc:  Oral Oral   SpO2: 93% 94% 93% 93%  Weight:      Height:        Intake/Output Summary (Last 24 hours) at 03/16/2024 1055 Last data filed at 03/16/2024 1000 Gross per 24 hour  Intake 2708.91 ml  Output 1000 ml  Net 1708.91 ml   Filed Weights   03/15/24 0108  Weight: 40.6 kg    Examination:  General exam: Overall comfortable, not in distress, mal-nourished HEENT: PERRL Respiratory system:  no wheezes or crackles  Cardiovascular system: S1 & S2 heard, RRR.  Gastrointestinal system: Abdomen is nondistended, soft and  nontender. Central nervous system: Alert and oriented Extremities: No edema, no clubbing ,no cyanosis Skin: No rashes, no ulcers,no icterus     Data Reviewed: I have personally reviewed following labs and imaging studies  CBC: Recent Labs  Lab 03/14/24 1508 03/15/24 0313  WBC 11.7* 10.9*  NEUTROABS 9.5*  --   HGB 15.0 12.9  HCT 44.5 39.3  MCV 89.7 93.6  PLT 255 196   Basic Metabolic Panel: Recent Labs  Lab 03/14/24 1508 03/15/24 0313  NA 136 139  K 3.7 3.8  CL 99 104  CO2 24 24  GLUCOSE 214* 111*  BUN 14 17  CREATININE 0.84 0.70  0.68  CALCIUM 10.0 8.8*     Recent Results (from the past 240 hours)  Culture, blood (Routine x 2)     Status: None (Preliminary result)   Collection Time: 03/14/24  3:12 PM   Specimen: BLOOD  Result Value Ref Range Status   Specimen Description   Final    BLOOD LEFT ANTECUBITAL Performed at [facility], [address] [place], [place] [address]    Special Requests   Final    BOTTLES DRAWN AEROBIC AND ANAEROBIC Blood Culture adequate volume Performed at [facility], [address] [place], [place] [address]    Culture   Final    NO GROWTH 2 DAYS Performed at [facility], [address] [place], [place] [address]    Report Status PENDING  Incomplete  Urine Culture     Status: Abnormal   Collection Time: 03/14/24  4:40 PM   Specimen: Urine, Clean Catch  Result Value Ref Range Status   Specimen Description   Final    URINE, CLEAN CATCH Performed at [facility], [address] [place], [place] [address]    Special Requests   Final    NONE Reflexed from [id number] Performed at [facility], [address] [place], [place] [address]    Culture >=100,000 COLONIES/mL ESCHERICHIA COLI (A)  Final   Report Status 03/16/2024 FINAL  Final   Organism ID, Bacteria ESCHERICHIA COLI (A)  Final      Susceptibility   Escherichia coli - MIC*    AMPICILLIN >=32  RESISTANT Resistant     CEFAZOLIN (URINE) Value in next row Resistant      >=32 RESISTANTThis is a modified FDA-approved test that has been validated and its performance characteristics determined by the reporting laboratory.  This laboratory is certified under the Clinical Laboratory Improvement Amendments CLIA as qualified to perform high complexity clinical laboratory testing.    CEFEPIME  Value in next row Resistant      >=32 RESISTANTThis is a modified FDA-approved test that has been validated and its performance characteristics determined by the reporting laboratory.  This laboratory is certified under the Clinical Laboratory Improvement Amendments CLIA as qualified to perform high complexity clinical laboratory testing.    ERTAPENEM Value in next row Sensitive      >=  32 RESISTANTThis is a modified FDA-approved test that has been validated and its performance characteristics determined by the reporting laboratory.  This laboratory is certified under the Clinical Laboratory Improvement Amendments CLIA as qualified to perform high complexity clinical laboratory testing.    CEFTRIAXONE  Value in next row Resistant      >=32 RESISTANTThis is a modified FDA-approved test that has been validated and its performance characteristics determined by the reporting laboratory.  This laboratory is certified under the Clinical Laboratory Improvement Amendments CLIA as qualified to perform high complexity clinical laboratory testing.    CIPROFLOXACIN Value in next row Resistant      >=32 RESISTANTThis is a modified FDA-approved test that has been validated and its performance characteristics determined by the reporting laboratory.  This laboratory is certified under the Clinical Laboratory Improvement Amendments CLIA as qualified to perform high complexity clinical laboratory testing.    GENTAMICIN Value in next row Resistant      >=32 RESISTANTThis is a modified FDA-approved test that has been validated and its  performance characteristics determined by the reporting laboratory.  This laboratory is certified under the Clinical Laboratory Improvement Amendments CLIA as qualified to perform high complexity clinical laboratory testing.    NITROFURANTOIN Value in next row Resistant      >=32 RESISTANTThis is a modified FDA-approved test that has been validated and its performance characteristics determined by the reporting laboratory.  This laboratory is certified under the Clinical Laboratory Improvement Amendments CLIA as qualified to perform high complexity clinical laboratory testing.    TRIMETH /SULFA  Value in next row Sensitive      >=32 RESISTANTThis is a modified FDA-approved test that has been validated and its performance characteristics determined by the reporting laboratory.  This laboratory is certified under the Clinical Laboratory Improvement Amendments CLIA as qualified to perform high complexity clinical laboratory testing.    AMPICILLIN/SULBACTAM Value in next row Resistant      >=32 RESISTANTThis is a modified FDA-approved test that has been validated and its performance characteristics determined by the reporting laboratory.  This laboratory is certified under the Clinical Laboratory Improvement Amendments CLIA as qualified to perform high complexity clinical laboratory testing.    PIP/TAZO Value in next row Resistant      >=128 RESISTANTThis is a modified FDA-approved test that has been validated and its performance characteristics determined by the reporting laboratory.  This laboratory is certified under the Clinical Laboratory Improvement Amendments CLIA as qualified to perform high complexity clinical laboratory testing.    MEROPENEM  Value in next row Sensitive      >=128 RESISTANTThis is a modified FDA-approved test that has been validated and its performance characteristics determined by the reporting laboratory.  This laboratory is certified under the Clinical Laboratory Improvement  Amendments CLIA as qualified to perform high complexity clinical laboratory testing.    * >=100,000 COLONIES/mL ESCHERICHIA COLI  Resp panel by RT-PCR (RSV, Flu A&B, Covid)     Status: None   Collection Time: 03/14/24  5:44 PM  Result Value Ref Range Status   SARS Coronavirus 2 by RT PCR NEGATIVE NEGATIVE Final    Comment: (NOTE) SARS-CoV-2 target nucleic acids are NOT DETECTED.  The SARS-CoV-2 RNA is generally detectable in upper respiratory specimens during the acute phase of infection. The lowest concentration of SARS-CoV-2 viral copies this assay can detect is 138 copies/mL. A negative result does not preclude SARS-Cov-2 infection and should not be used as the sole basis for treatment or other patient management decisions. A negative  result may occur with  improper specimen collection/handling, submission of specimen other than nasopharyngeal swab, presence of viral mutation(s) within the areas targeted by this assay, and inadequate number of viral copies(<138 copies/mL). A negative result must be combined with clinical observations, patient history, and epidemiological information. The expected result is Negative.  Fact Sheet for Patients:  BloggerCourse.com  Fact Sheet for Healthcare Providers:  SeriousBroker.it  This test is no t yet approved or cleared by the [place]  FDA and  has been authorized for detection and/or diagnosis of SARS-CoV-2 by FDA under an Emergency Use Authorization (EUA). This EUA will remain  in effect (meaning this test can be used) for the duration of the COVID-19 declaration under Section 564(b)(1) of the Act, 21 U.S.C.section 360bbb-3(b)(1), unless the authorization is terminated  or revoked sooner.       Influenza A by PCR NEGATIVE NEGATIVE Final   Influenza B by PCR NEGATIVE NEGATIVE Final    Comment: (NOTE) The Xpert Xpress SARS-CoV-2/FLU/RSV plus assay is intended as an aid in the  diagnosis of influenza from Nasopharyngeal swab specimens and should not be used as a sole basis for treatment. Nasal washings and aspirates are unacceptable for Xpert Xpress SARS-CoV-2/FLU/RSV testing.  Fact Sheet for Patients: BloggerCourse.com  Fact Sheet for Healthcare Providers: SeriousBroker.it  This test is not yet approved or cleared by the [place]  FDA and has been authorized for detection and/or diagnosis of SARS-CoV-2 by FDA under an Emergency Use Authorization (EUA). This EUA will remain in effect (meaning this test can be used) for the duration of the COVID-19 declaration under Section 564(b)(1) of the Act, 21 U.S.C. section 360bbb-3(b)(1), unless the authorization is terminated or revoked.     Resp Syncytial Virus by PCR NEGATIVE NEGATIVE Final    Comment: (NOTE) Fact Sheet for Patients: BloggerCourse.com  Fact Sheet for Healthcare Providers: SeriousBroker.it  This test is not yet approved or cleared by the [place]  FDA and has been authorized for detection and/or diagnosis of SARS-CoV-2 by FDA under an Emergency Use Authorization (EUA). This EUA will remain in effect (meaning this test can be used) for the duration of the COVID-19 declaration under Section 564(b)(1) of the Act, 21 U.S.C. section 360bbb-3(b)(1), unless the authorization is terminated or revoked.  Performed at [facility], [address], [place], [place] [address]   MRSA Next Gen by PCR, Nasal     Status: Abnormal   Collection Time: 03/15/24  1:12 AM   Specimen: Nasal Mucosa; Nasal Swab  Result Value Ref Range Status   MRSA by PCR Next Gen DETECTED (A) NOT DETECTED Final    Comment: (NOTE) The GeneXpert MRSA Assay (FDA approved for NASAL specimens only), is one component of a comprehensive MRSA colonization surveillance program. It is not intended to diagnose  MRSA infection nor to guide or monitor treatment for MRSA infections. Test performance is not FDA approved in patients less than 52 years old. Performed at [facility], [address], [place], [place] [address]      Radiology Studies: DG Chest 2 View if patient is not in a treatment room. Result Date: 03/14/2024 CLINICAL DATA:  Suspected Sepsis EXAM: CHEST - 2 VIEW COMPARISON:  02/01/2024 FINDINGS: Emphysema. No focal airspace consolidation, pleural effusion, or pneumothorax. No cardiomegaly.No acute fracture or destructive lesion. IMPRESSION: Emphysema.  No acute cardiopulmonary abnormality. Electronically Signed   By: Rogelia Myers M.D.   On: 03/14/2024 15:49    Scheduled Meds:  Chlorhexidine  Gluconate Cloth  6  each Topical Daily   enoxaparin  (LOVENOX ) injection  30 mg Subcutaneous Q24H   feeding supplement  237 mL Oral TID BM   multivitamin with minerals  1 tablet Oral Daily   nicotine   14 mg Transdermal Daily   Continuous Infusions:  meropenem  (MERREM ) IV 1 g (03/16/24 0417)     LOS: 1 day   Ivonne Mustache, MD [facility] P9/24/2025, 10:55 AM

## 2024-03-17 ENCOUNTER — Other Ambulatory Visit (HOSPITAL_COMMUNITY): Payer: Self-pay

## 2024-03-17 DIAGNOSIS — N390 Urinary tract infection, site not specified: Secondary | ICD-10-CM | POA: Diagnosis not present

## 2024-03-17 DIAGNOSIS — B9629 Other Escherichia coli [E. coli] as the cause of diseases classified elsewhere: Secondary | ICD-10-CM | POA: Diagnosis not present

## 2024-03-17 DIAGNOSIS — Z1612 Extended spectrum beta lactamase (ESBL) resistance: Secondary | ICD-10-CM | POA: Diagnosis not present

## 2024-03-17 MED ORDER — NICOTINE 14 MG/24HR TD PT24
14.0000 mg | MEDICATED_PATCH | Freq: Every day | TRANSDERMAL | 0 refills | Status: AC
  Filled 2024-03-17: qty 28, 28d supply, fill #0

## 2024-03-17 MED ORDER — SULFAMETHOXAZOLE-TRIMETHOPRIM 800-160 MG PO TABS
1.0000 | ORAL_TABLET | Freq: Two times a day (BID) | ORAL | 0 refills | Status: AC
  Filled 2024-03-17: qty 6, 3d supply, fill #0

## 2024-03-17 MED ORDER — SULFAMETHOXAZOLE-TRIMETHOPRIM 800-160 MG PO TABS
1.0000 | ORAL_TABLET | Freq: Two times a day (BID) | ORAL | Status: DC
Start: 2024-03-17 — End: 2024-03-17
  Administered 2024-03-17: 1 via ORAL
  Filled 2024-03-17: qty 1

## 2024-03-17 MED ORDER — ALBUTEROL SULFATE HFA 108 (90 BASE) MCG/ACT IN AERS
1.0000 | INHALATION_SPRAY | Freq: Four times a day (QID) | RESPIRATORY_TRACT | 0 refills | Status: AC | PRN
  Filled 2024-03-17: qty 18, 25d supply, fill #0

## 2024-03-17 MED ORDER — TRELEGY ELLIPTA 100-62.5-25 MCG/ACT IN AEPB
1.0000 | INHALATION_SPRAY | Freq: Every day | RESPIRATORY_TRACT | 0 refills | Status: AC
  Filled 2024-03-17: qty 60, 30d supply, fill #0

## 2024-03-17 NOTE — Discharge Summary (Signed)
 Physician Discharge Summary  Deanna Klein FMW:[id number] DOB: [birth year] DOA: 03/14/2024  PCP: [facility]  Admit date: 03/14/2024 Discharge date: 03/17/2024  Admitted From: Home Disposition:  Home  Discharge Condition:Stable CODE STATUS:FULL Diet recommendation:  Regular  Brief/Interim Summary: Patient is a 48 year old homeless female with history of ESBL UTI, COPD, substance abuse, seizures, recent admission for sepsis from UTI who presented with fever, chills from home. On presentation, she was in sinus tachycardia, transiently hypotensive. Initial plan was to start on vasopressors but blood pressure improved with IV fluid. UA was concerning for UTI. Started on empiric antibiotics. Urine culture showed ESBL E. coli. Currently on meropenem  ,antibiotics changed to bactrim  today.  Medically stable for discharge  Following problems were addressed during the hospitalization:   ESBL UTI: Presented with fever, chills, leukocytosis.  Lactic acid normal, procalcitonin reassuring.  History of ESBL UTI with sepsis.  Started  on meropenem .  Blood cultures have been negative. Sepsis physiology has improved, currently afebrile. Urine culture showed ESBL E. coli.  Abx changed to bactrim  today   History of COPD/tabacco use: Currently not wheezing.  Smokes cigarettes daily, does not count the number.  Currently on nicotine  patch.Continue home inhalers   History of substance abuse: Cocaine positive in UDS.  Snorts cocaine.  Advised quitting drugs.  TOC consulted   Hypotension: Currently blood pressure stable.   Hyperglycemia: A1c 5.2   Severe protein calorie malnutrition: Nutritionist consulted and following   Deconditioning/debility/homelessness: Lives in a tent.  No problem with ambulation.  TOC consulted  Discharge Diagnoses:  Principal Problem:   Sepsis (HCC) Active Problems:   Cocaine abuse (HCC)   COPD (chronic obstructive pulmonary disease) (HCC)   UTI (urinary tract  infection)   SIRS (systemic inflammatory response syndrome) (HCC)   Elevated blood pressure reading   Hyperglycemia    Discharge Instructions  Discharge Instructions     Diet - low sodium heart healthy   Complete by: As directed    Discharge instructions   Complete by: As directed    1)Please take your medications as instructed 2)Follow up with your PCP in a week 3)Stop smoking   Increase activity slowly   Complete by: As directed       Allergies as of 03/17/2024       Reactions   Darvocet [propoxyphene N-acetaminophen ] Hives   Percocet [oxycodone -acetaminophen ] Hives        Medication List     TAKE these medications    albuterol  108 (90 Base) MCG/ACT inhaler Commonly known as: VENTOLIN  HFA Inhale 1-2 puffs into the lungs every 6 (six) hours as needed for wheezing or shortness of breath.   nicotine  14 mg/24hr patch Commonly known as: NICODERM CQ  - dosed in mg/24 hours Place 1 patch (14 mg total) onto the skin daily. Start taking on: March 18, 2024   sulfamethoxazole -trimethoprim  800-160 MG tablet Commonly known as: Bactrim  DS Take 1 tablet by mouth 2 (two) times daily for 6 doses.   Trelegy Ellipta  100-62.5-25 MCG/ACT Aepb Generic drug: Fluticasone -Umeclidin-Vilant Inhale 1 puff into the lungs daily.        Follow-up Information     [facility] Schedule an appointment as soon as possible for a visit in 1 week(s).   Contact information: [address] [place] [address] [phone]                Allergies  Allergen Reactions   Darvocet [Propoxyphene N-Acetaminophen ] Hives   Percocet [Oxycodone -Acetaminophen ] Hives    Consultations: None  Procedures/Studies: DG Chest 2 View if patient is not in a treatment room. Result Date: 03/14/2024 CLINICAL DATA:  Suspected Sepsis EXAM: CHEST - 2 VIEW COMPARISON:  02/01/2024 FINDINGS: Emphysema. No focal airspace consolidation, pleural effusion, or pneumothorax. No  cardiomegaly.No acute fracture or destructive lesion. IMPRESSION: Emphysema.  No acute cardiopulmonary abnormality. Electronically Signed   By: Rogelia Myers M.D.   On: 03/14/2024 15:49      Subjective: Patient seen and examined at bedside today. Hemodynamically stable. Overall comfortable. Lying in bed. No new complaints. Feels ready to be discharged  Discharge Exam: Vitals:   03/16/24 1945 03/17/24 0625  BP: 110/80 100/79  Pulse: (!) 105 100  Resp: 17 18  Temp: 97.7 F (36.5 C) 97.8 F (36.6 C)  SpO2: 95% 92%   Vitals:   03/16/24 1339 03/16/24 1523 03/16/24 1945 03/17/24 0625  BP: 100/76 107/84 110/80 100/79  Pulse: (!) 101 (!) 106 (!) 105 100  Resp: 18  17 18   Temp: 98.6 F (37 C) 98.4 F (36.9 C) 97.7 F (36.5 C) 97.8 F (36.6 C)  TempSrc: Oral Oral    SpO2: 94% 91% 95% 92%  Weight:      Height:        General: Pt is alert, awake, not in acute distress Cardiovascular: RRR, S1/S2 +, no rubs, no gallops Respiratory: CTA bilaterally, no wheezing, no rhonchi Abdominal: Soft, NT, ND, bowel sounds + Extremities: no edema, no cyanosis    The results of significant diagnostics from this hospitalization (including imaging, microbiology, ancillary and laboratory) are listed below for reference.     Microbiology: Recent Results (from the past 240 hours)  Culture, blood (Routine x 2)     Status: None (Preliminary result)   Collection Time: 03/14/24  3:12 PM   Specimen: BLOOD  Result Value Ref Range Status   Specimen Description   Final    BLOOD LEFT ANTECUBITAL Performed at [facility], [address] [place], [place] [address]    Special Requests   Final    BOTTLES DRAWN AEROBIC AND ANAEROBIC Blood Culture adequate volume Performed at [facility], [address] [place], [place] [address]    Culture   Final    NO GROWTH 3 DAYS Performed at [facility], [address], [place], [place] [address]    Report  Status PENDING  Incomplete  Urine Culture     Status: Abnormal   Collection Time: 03/14/24  4:40 PM   Specimen: Urine, Clean Catch  Result Value Ref Range Status   Specimen Description   Final    URINE, CLEAN CATCH Performed at [facility], [address], [place], [place] [address]    Special Requests   Final    NONE Reflexed from [id number] Performed at [facility], [address], [place], [place] [address]    Culture >=100,000 COLONIES/mL ESCHERICHIA COLI (A)  Final   Report Status 03/16/2024 FINAL  Final   Organism ID, Bacteria ESCHERICHIA COLI (A)  Final      Susceptibility   Escherichia coli - MIC*    AMPICILLIN >=32 RESISTANT Resistant     CEFAZOLIN (URINE) Value in next row Resistant      >=32 RESISTANTThis is a modified FDA-approved test that has been validated and its performance characteristics determined by the reporting laboratory.  This laboratory is certified under the Clinical Laboratory Improvement Amendments CLIA as qualified to perform high complexity clinical laboratory testing.    CEFEPIME  Value in  next row Resistant      >=32 RESISTANTThis is a modified FDA-approved test that has been validated and its performance characteristics determined by the reporting laboratory.  This laboratory is certified under the Clinical Laboratory Improvement Amendments CLIA as qualified to perform high complexity clinical laboratory testing.    ERTAPENEM Value in next row Sensitive      >=32 RESISTANTThis is a modified FDA-approved test that has been validated and its performance characteristics determined by the reporting laboratory.  This laboratory is certified under the Clinical Laboratory Improvement Amendments CLIA as qualified to perform high complexity clinical laboratory testing.    CEFTRIAXONE  Value in next row Resistant      >=32 RESISTANTThis is a modified FDA-approved test that has been validated and its performance characteristics determined by the  reporting laboratory.  This laboratory is certified under the Clinical Laboratory Improvement Amendments CLIA as qualified to perform high complexity clinical laboratory testing.    CIPROFLOXACIN Value in next row Resistant      >=32 RESISTANTThis is a modified FDA-approved test that has been validated and its performance characteristics determined by the reporting laboratory.  This laboratory is certified under the Clinical Laboratory Improvement Amendments CLIA as qualified to perform high complexity clinical laboratory testing.    GENTAMICIN Value in next row Resistant      >=32 RESISTANTThis is a modified FDA-approved test that has been validated and its performance characteristics determined by the reporting laboratory.  This laboratory is certified under the Clinical Laboratory Improvement Amendments CLIA as qualified to perform high complexity clinical laboratory testing.    NITROFURANTOIN Value in next row Resistant      >=32 RESISTANTThis is a modified FDA-approved test that has been validated and its performance characteristics determined by the reporting laboratory.  This laboratory is certified under the Clinical Laboratory Improvement Amendments CLIA as qualified to perform high complexity clinical laboratory testing.    TRIMETH /SULFA  Value in next row Sensitive      >=32 RESISTANTThis is a modified FDA-approved test that has been validated and its performance characteristics determined by the reporting laboratory.  This laboratory is certified under the Clinical Laboratory Improvement Amendments CLIA as qualified to perform high complexity clinical laboratory testing.    AMPICILLIN/SULBACTAM Value in next row Resistant      >=32 RESISTANTThis is a modified FDA-approved test that has been validated and its performance characteristics determined by the reporting laboratory.  This laboratory is certified under the Clinical Laboratory Improvement Amendments CLIA as qualified to perform high  complexity clinical laboratory testing.    PIP/TAZO Value in next row Resistant      >=128 RESISTANTThis is a modified FDA-approved test that has been validated and its performance characteristics determined by the reporting laboratory.  This laboratory is certified under the Clinical Laboratory Improvement Amendments CLIA as qualified to perform high complexity clinical laboratory testing.    MEROPENEM  Value in next row Sensitive      >=128 RESISTANTThis is a modified FDA-approved test that has been validated and its performance characteristics determined by the reporting laboratory.  This laboratory is certified under the Clinical Laboratory Improvement Amendments CLIA as qualified to perform high complexity clinical laboratory testing.    * >=100,000 COLONIES/mL ESCHERICHIA COLI  Resp panel by RT-PCR (RSV, Flu A&B, Covid)     Status: None   Collection Time: 03/14/24  5:44 PM  Result Value Ref Range Status   SARS Coronavirus 2 by RT PCR NEGATIVE NEGATIVE Final    Comment: (NOTE) SARS-CoV-2  target nucleic acids are NOT DETECTED.  The SARS-CoV-2 RNA is generally detectable in upper respiratory specimens during the acute phase of infection. The lowest concentration of SARS-CoV-2 viral copies this assay can detect is 138 copies/mL. A negative result does not preclude SARS-Cov-2 infection and should not be used as the sole basis for treatment or other patient management decisions. A negative result may occur with  improper specimen collection/handling, submission of specimen other than nasopharyngeal swab, presence of viral mutation(s) within the areas targeted by this assay, and inadequate number of viral copies(<138 copies/mL). A negative result must be combined with clinical observations, patient history, and epidemiological information. The expected result is Negative.  Fact Sheet for Patients:  BloggerCourse.com  Fact Sheet for Healthcare Providers:   SeriousBroker.it  This test is no t yet approved or cleared by the [place]  FDA and  has been authorized for detection and/or diagnosis of SARS-CoV-2 by FDA under an Emergency Use Authorization (EUA). This EUA will remain  in effect (meaning this test can be used) for the duration of the COVID-19 declaration under Section 564(b)(1) of the Act, 21 U.S.C.section 360bbb-3(b)(1), unless the authorization is terminated  or revoked sooner.       Influenza A by PCR NEGATIVE NEGATIVE Final   Influenza B by PCR NEGATIVE NEGATIVE Final    Comment: (NOTE) The Xpert Xpress SARS-CoV-2/FLU/RSV plus assay is intended as an aid in the diagnosis of influenza from Nasopharyngeal swab specimens and should not be used as a sole basis for treatment. Nasal washings and aspirates are unacceptable for Xpert Xpress SARS-CoV-2/FLU/RSV testing.  Fact Sheet for Patients: BloggerCourse.com  Fact Sheet for Healthcare Providers: SeriousBroker.it  This test is not yet approved or cleared by the [place]  FDA and has been authorized for detection and/or diagnosis of SARS-CoV-2 by FDA under an Emergency Use Authorization (EUA). This EUA will remain in effect (meaning this test can be used) for the duration of the COVID-19 declaration under Section 564(b)(1) of the Act, 21 U.S.C. section 360bbb-3(b)(1), unless the authorization is terminated or revoked.     Resp Syncytial Virus by PCR NEGATIVE NEGATIVE Final    Comment: (NOTE) Fact Sheet for Patients: BloggerCourse.com  Fact Sheet for Healthcare Providers: SeriousBroker.it  This test is not yet approved or cleared by the [place]  FDA and has been authorized for detection and/or diagnosis of SARS-CoV-2 by FDA under an Emergency Use Authorization (EUA). This EUA will remain in effect (meaning this test can be used) for  the duration of the COVID-19 declaration under Section 564(b)(1) of the Act, 21 U.S.C. section 360bbb-3(b)(1), unless the authorization is terminated or revoked.  Performed at [facility], [address] [place], [place] [address]   MRSA Next Gen by PCR, Nasal     Status: Abnormal   Collection Time: 03/15/24  1:12 AM   Specimen: Nasal Mucosa; Nasal Swab  Result Value Ref Range Status   MRSA by PCR Next Gen DETECTED (A) NOT DETECTED Final    Comment: (NOTE) The GeneXpert MRSA Assay (FDA approved for NASAL specimens only), is one component of a comprehensive MRSA colonization surveillance program. It is not intended to diagnose MRSA infection nor to guide or monitor treatment for MRSA infections. Test performance is not FDA approved in patients less than 27 years old. Performed at [facility], [address], [place], [place] [address]      Labs: BNP (last 3 results) No results for input(s): BNP in the last 8760 hours. Basic  Metabolic Panel: Recent Labs  Lab 03/14/24 1508 03/15/24 0313  NA 136 139  K 3.7 3.8  CL 99 104  CO2 24 24  GLUCOSE 214* 111*  BUN 14 17  CREATININE 0.84 0.70  0.68  CALCIUM 10.0 8.8*   Liver Function Tests: Recent Labs  Lab 03/14/24 1508 03/15/24 0313  AST 25 17  ALT 18 11  ALKPHOS 84 71  BILITOT 0.6 0.4  PROT 8.0 5.8*  ALBUMIN 4.6 3.4*   No results for input(s): LIPASE, AMYLASE in the last 168 hours. No results for input(s): AMMONIA in the last 168 hours. CBC: Recent Labs  Lab 03/14/24 1508 03/15/24 0313  WBC 11.7* 10.9*  NEUTROABS 9.5*  --   HGB 15.0 12.9  HCT 44.5 39.3  MCV 89.7 93.6  PLT 255 196   Cardiac Enzymes: No results for input(s): CKTOTAL, CKMB, CKMBINDEX, TROPONINI in the last 168 hours. BNP: Invalid input(s): POCBNP CBG: No results for input(s): GLUCAP in the last 168 hours. D-Dimer No results for input(s): DDIMER in the last 72 hours. Hgb  A1c Recent Labs    03/15/24 0313  HGBA1C 5.2   Lipid Profile No results for input(s): CHOL, HDL, LDLCALC, TRIG, CHOLHDL, LDLDIRECT in the last 72 hours. Thyroid function studies No results for input(s): TSH, T4TOTAL, T3FREE, THYROIDAB in the last 72 hours.  Invalid input(s): FREET3 Anemia work up No results for input(s): VITAMINB12, FOLATE, FERRITIN, TIBC, IRON, RETICCTPCT in the last 72 hours. Urinalysis    Component Value Date/Time   COLORURINE COLORLESS (A) 03/14/2024 1640   APPEARANCEUR CLEAR 03/14/2024 1640   LABSPEC <1.005 (L) 03/14/2024 1640   PHURINE 6.0 03/14/2024 1640   GLUCOSEU NEGATIVE 03/14/2024 1640   HGBUR TRACE (A) 03/14/2024 1640   BILIRUBINUR NEGATIVE 03/14/2024 1640   KETONESUR NEGATIVE 03/14/2024 1640   PROTEINUR NEGATIVE 03/14/2024 1640   UROBILINOGEN 1.0 03/31/2011 2207   NITRITE NEGATIVE 03/14/2024 1640   LEUKOCYTESUR LARGE (A) 03/14/2024 1640   Sepsis Labs Recent Labs  Lab 03/14/24 1508 03/15/24 0313  WBC 11.7* 10.9*   Microbiology Recent Results (from the past 240 hours)  Culture, blood (Routine x 2)     Status: None (Preliminary result)   Collection Time: 03/14/24  3:12 PM   Specimen: BLOOD  Result Value Ref Range Status   Specimen Description   Final    BLOOD LEFT ANTECUBITAL Performed at [facility], [address], [place], [place] [address]    Special Requests   Final    BOTTLES DRAWN AEROBIC AND ANAEROBIC Blood Culture adequate volume Performed at [facility], [address] [place], [place] [address]    Culture   Final    NO GROWTH 3 DAYS Performed at [facility], [address], [place], [place] [address]    Report Status PENDING  Incomplete  Urine Culture     Status: Abnormal   Collection Time: 03/14/24  4:40 PM   Specimen: Urine, Clean Catch  Result Value Ref Range Status   Specimen Description   Final    URINE, CLEAN CATCH Performed at  [facility], [address] [place], [place] [address]    Special Requests   Final    NONE Reflexed from [id number] Performed at [facility], [address], [place], [place] [address]    Culture >=100,000 COLONIES/mL ESCHERICHIA COLI (A)  Final   Report Status 03/16/2024 FINAL  Final   Organism ID, Bacteria ESCHERICHIA COLI (A)  Final  Susceptibility   Escherichia coli - MIC*    AMPICILLIN >=32 RESISTANT Resistant     CEFAZOLIN (URINE) Value in next row Resistant      >=32 RESISTANTThis is a modified FDA-approved test that has been validated and its performance characteristics determined by the reporting laboratory.  This laboratory is certified under the Clinical Laboratory Improvement Amendments CLIA as qualified to perform high complexity clinical laboratory testing.    CEFEPIME  Value in next row Resistant      >=32 RESISTANTThis is a modified FDA-approved test that has been validated and its performance characteristics determined by the reporting laboratory.  This laboratory is certified under the Clinical Laboratory Improvement Amendments CLIA as qualified to perform high complexity clinical laboratory testing.    ERTAPENEM Value in next row Sensitive      >=32 RESISTANTThis is a modified FDA-approved test that has been validated and its performance characteristics determined by the reporting laboratory.  This laboratory is certified under the Clinical Laboratory Improvement Amendments CLIA as qualified to perform high complexity clinical laboratory testing.    CEFTRIAXONE  Value in next row Resistant      >=32 RESISTANTThis is a modified FDA-approved test that has been validated and its performance characteristics determined by the reporting laboratory.  This laboratory is certified under the Clinical Laboratory Improvement Amendments CLIA as qualified to perform high complexity clinical laboratory testing.    CIPROFLOXACIN Value in next row Resistant      >=32  RESISTANTThis is a modified FDA-approved test that has been validated and its performance characteristics determined by the reporting laboratory.  This laboratory is certified under the Clinical Laboratory Improvement Amendments CLIA as qualified to perform high complexity clinical laboratory testing.    GENTAMICIN Value in next row Resistant      >=32 RESISTANTThis is a modified FDA-approved test that has been validated and its performance characteristics determined by the reporting laboratory.  This laboratory is certified under the Clinical Laboratory Improvement Amendments CLIA as qualified to perform high complexity clinical laboratory testing.    NITROFURANTOIN Value in next row Resistant      >=32 RESISTANTThis is a modified FDA-approved test that has been validated and its performance characteristics determined by the reporting laboratory.  This laboratory is certified under the Clinical Laboratory Improvement Amendments CLIA as qualified to perform high complexity clinical laboratory testing.    TRIMETH /SULFA  Value in next row Sensitive      >=32 RESISTANTThis is a modified FDA-approved test that has been validated and its performance characteristics determined by the reporting laboratory.  This laboratory is certified under the Clinical Laboratory Improvement Amendments CLIA as qualified to perform high complexity clinical laboratory testing.    AMPICILLIN/SULBACTAM Value in next row Resistant      >=32 RESISTANTThis is a modified FDA-approved test that has been validated and its performance characteristics determined by the reporting laboratory.  This laboratory is certified under the Clinical Laboratory Improvement Amendments CLIA as qualified to perform high complexity clinical laboratory testing.    PIP/TAZO Value in next row Resistant      >=128 RESISTANTThis is a modified FDA-approved test that has been validated and its performance characteristics determined by the reporting laboratory.   This laboratory is certified under the Clinical Laboratory Improvement Amendments CLIA as qualified to perform high complexity clinical laboratory testing.    MEROPENEM  Value in next row Sensitive      >=128 RESISTANTThis is a modified FDA-approved test that has been validated and its performance characteristics determined by the  reporting laboratory.  This laboratory is certified under the Clinical Laboratory Improvement Amendments CLIA as qualified to perform high complexity clinical laboratory testing.    * >=100,000 COLONIES/mL ESCHERICHIA COLI  Resp panel by RT-PCR (RSV, Flu A&B, Covid)     Status: None   Collection Time: 03/14/24  5:44 PM  Result Value Ref Range Status   SARS Coronavirus 2 by RT PCR NEGATIVE NEGATIVE Final    Comment: (NOTE) SARS-CoV-2 target nucleic acids are NOT DETECTED.  The SARS-CoV-2 RNA is generally detectable in upper respiratory specimens during the acute phase of infection. The lowest concentration of SARS-CoV-2 viral copies this assay can detect is 138 copies/mL. A negative result does not preclude SARS-Cov-2 infection and should not be used as the sole basis for treatment or other patient management decisions. A negative result may occur with  improper specimen collection/handling, submission of specimen other than nasopharyngeal swab, presence of viral mutation(s) within the areas targeted by this assay, and inadequate number of viral copies(<138 copies/mL). A negative result must be combined with clinical observations, patient history, and epidemiological information. The expected result is Negative.  Fact Sheet for Patients:  BloggerCourse.com  Fact Sheet for Healthcare Providers:  SeriousBroker.it  This test is no t yet approved or cleared by the [place]  FDA and  has been authorized for detection and/or diagnosis of SARS-CoV-2 by FDA under an Emergency Use Authorization (EUA). This EUA will  remain  in effect (meaning this test can be used) for the duration of the COVID-19 declaration under Section 564(b)(1) of the Act, 21 U.S.C.section 360bbb-3(b)(1), unless the authorization is terminated  or revoked sooner.       Influenza A by PCR NEGATIVE NEGATIVE Final   Influenza B by PCR NEGATIVE NEGATIVE Final    Comment: (NOTE) The Xpert Xpress SARS-CoV-2/FLU/RSV plus assay is intended as an aid in the diagnosis of influenza from Nasopharyngeal swab specimens and should not be used as a sole basis for treatment. Nasal washings and aspirates are unacceptable for Xpert Xpress SARS-CoV-2/FLU/RSV testing.  Fact Sheet for Patients: BloggerCourse.com  Fact Sheet for Healthcare Providers: SeriousBroker.it  This test is not yet approved or cleared by the [place]  FDA and has been authorized for detection and/or diagnosis of SARS-CoV-2 by FDA under an Emergency Use Authorization (EUA). This EUA will remain in effect (meaning this test can be used) for the duration of the COVID-19 declaration under Section 564(b)(1) of the Act, 21 U.S.C. section 360bbb-3(b)(1), unless the authorization is terminated or revoked.     Resp Syncytial Virus by PCR NEGATIVE NEGATIVE Final    Comment: (NOTE) Fact Sheet for Patients: BloggerCourse.com  Fact Sheet for Healthcare Providers: SeriousBroker.it  This test is not yet approved or cleared by the [place]  FDA and has been authorized for detection and/or diagnosis of SARS-CoV-2 by FDA under an Emergency Use Authorization (EUA). This EUA will remain in effect (meaning this test can be used) for the duration of the COVID-19 declaration under Section 564(b)(1) of the Act, 21 U.S.C. section 360bbb-3(b)(1), unless the authorization is terminated or revoked.  Performed at [facility], [address] [place],  [place] [address]   MRSA Next Gen by PCR, Nasal     Status: Abnormal   Collection Time: 03/15/24  1:12 AM   Specimen: Nasal Mucosa; Nasal Swab  Result Value Ref Range Status   MRSA by PCR Next Gen DETECTED (A) NOT DETECTED Final    Comment: (NOTE) The GeneXpert MRSA Assay (FDA approved for  NASAL specimens only), is one component of a comprehensive MRSA colonization surveillance program. It is not intended to diagnose MRSA infection nor to guide or monitor treatment for MRSA infections. Test performance is not FDA approved in patients less than 72 years old. Performed at [facility], [address], [place], [place] [address]     Please note: You were cared for by a hospitalist during your hospital stay. Once you are discharged, your primary care physician will handle any further medical issues. Please note that NO REFILLS for any discharge medications will be authorized once you are discharged, as it is imperative that you return to your primary care physician (or establish a relationship with a primary care physician if you do not have one) for your post hospital discharge needs so that they can reassess your need for medications and monitor your lab values.    Time coordinating discharge: 40 minutes  SIGNED:   Ivonne Mustache, MD  [facility] 03/17/2024, 12:58 PM Pager [phone]  If 7PM-7AM, please contact night-coverage [email or web address] [facility] Physician Discharge Summary  Deanna Klein FMW:[id number] DOB: [birth year] DOA: 03/14/2024  PCP: [facility]  Admit date: 03/14/2024 Discharge date: 03/17/2024  Admitted From: Home Disposition:  Home  Discharge Condition:Stable CODE STATUS:FULL, DNR, Comfort Care Diet recommendation: Heart Healthy / Carb Modified / Regular / Dysphagia   Brief/Interim Summary:   Following problems were addressed during the hospitalization:   Discharge Diagnoses:  Principal Problem:   Sepsis (HCC) Active  Problems:   Cocaine abuse (HCC)   COPD (chronic obstructive pulmonary disease) (HCC)   UTI (urinary tract infection)   SIRS (systemic inflammatory response syndrome) (HCC)   Elevated blood pressure reading   Hyperglycemia    Discharge Instructions  Discharge Instructions     Diet - low sodium heart healthy   Complete by: As directed    Discharge instructions   Complete by: As directed    1)Please take your medications as instructed 2)Follow up with your PCP in a week 3)Stop smoking   Increase activity slowly   Complete by: As directed       Allergies as of 03/17/2024       Reactions   Darvocet [propoxyphene N-acetaminophen ] Hives   Percocet [oxycodone -acetaminophen ] Hives        Medication List     TAKE these medications    albuterol  108 (90 Base) MCG/ACT inhaler Commonly known as: VENTOLIN  HFA Inhale 1-2 puffs into the lungs every 6 (six) hours as needed for wheezing or shortness of breath.   nicotine  14 mg/24hr patch Commonly known as: NICODERM CQ  - dosed in mg/24 hours Place 1 patch (14 mg total) onto the skin daily. Start taking on: March 18, 2024   sulfamethoxazole -trimethoprim  800-160 MG tablet Commonly known as: Bactrim  DS Take 1 tablet by mouth 2 (two) times daily for 6 doses.   Trelegy Ellipta  100-62.5-25 MCG/ACT Aepb Generic drug: Fluticasone -Umeclidin-Vilant Inhale 1 puff into the lungs daily.        Follow-up Information     [facility]. Schedule an appointment as soon as possible for a visit in 1 week(s).   Contact information: [address] [place] [address] [phone]                Allergies  Allergen Reactions   Darvocet [Propoxyphene N-Acetaminophen ] Hives   Percocet [Oxycodone -Acetaminophen ] Hives    Consultations:    Procedures/Studies: DG Chest 2 View if patient is not in a treatment room. Result Date:  03/14/2024 CLINICAL DATA:  Suspected Sepsis EXAM: CHEST - 2 VIEW COMPARISON:   02/01/2024 FINDINGS: Emphysema. No focal airspace consolidation, pleural effusion, or pneumothorax. No cardiomegaly.No acute fracture or destructive lesion. IMPRESSION: Emphysema.  No acute cardiopulmonary abnormality. Electronically Signed   By: Rogelia Myers M.D.   On: 03/14/2024 15:49      Subjective:   Discharge Exam: Vitals:   03/16/24 1945 03/17/24 0625  BP: 110/80 100/79  Pulse: (!) 105 100  Resp: 17 18  Temp: 97.7 F (36.5 C) 97.8 F (36.6 C)  SpO2: 95% 92%   Vitals:   03/16/24 1339 03/16/24 1523 03/16/24 1945 03/17/24 0625  BP: 100/76 107/84 110/80 100/79  Pulse: (!) 101 (!) 106 (!) 105 100  Resp: 18  17 18   Temp: 98.6 F (37 C) 98.4 F (36.9 C) 97.7 F (36.5 C) 97.8 F (36.6 C)  TempSrc: Oral Oral    SpO2: 94% 91% 95% 92%  Weight:      Height:        General: Pt is alert, awake, not in acute distress Cardiovascular: RRR, S1/S2 +, no rubs, no gallops Respiratory: CTA bilaterally, no wheezing, no rhonchi Abdominal: Soft, NT, ND, bowel sounds + Extremities: no edema, no cyanosis    The results of significant diagnostics from this hospitalization (including imaging, microbiology, ancillary and laboratory) are listed below for reference.     Microbiology: Recent Results (from the past 240 hours)  Culture, blood (Routine x 2)     Status: None (Preliminary result)   Collection Time: 03/14/24  3:12 PM   Specimen: BLOOD  Result Value Ref Range Status   Specimen Description   Final    BLOOD LEFT ANTECUBITAL Performed at [facility], [address], [place], [place] [address]    Special Requests   Final    BOTTLES DRAWN AEROBIC AND ANAEROBIC Blood Culture adequate volume Performed at [facility], [address], [place], [place] [address]    Culture   Final    NO GROWTH 3 DAYS Performed at [facility], [address] [place], [place] [address]    Report Status PENDING  Incomplete  Urine Culture      Status: Abnormal   Collection Time: 03/14/24  4:40 PM   Specimen: Urine, Clean Catch  Result Value Ref Range Status   Specimen Description   Final    URINE, CLEAN CATCH Performed at [facility], [address], [place], [place] [address]    Special Requests   Final    NONE Reflexed from [id number] Performed at [facility], [address] [place] [address], [place], [place] [address]    Culture >=100,000 COLONIES/mL ESCHERICHIA COLI (A)  Final   Report Status 03/16/2024 FINAL  Final   Organism ID, Bacteria ESCHERICHIA COLI (A)  Final      Susceptibility   Escherichia coli - MIC*    AMPICILLIN >=32 RESISTANT Resistant     CEFAZOLIN (URINE) Value in next row Resistant      >=32 RESISTANTThis is a modified FDA-approved test that has been validated and its performance characteristics determined by the reporting laboratory.  This laboratory is certified under the Clinical Laboratory Improvement Amendments CLIA as qualified to perform high complexity clinical laboratory testing.    CEFEPIME  Value in next row Resistant      >=32 RESISTANTThis is a modified FDA-approved test that has been validated and its performance characteristics determined by the reporting laboratory.  This laboratory is certified under the Clinical  Laboratory Improvement Amendments CLIA as qualified to perform high complexity clinical laboratory testing.    ERTAPENEM Value in next row Sensitive      >=32 RESISTANTThis is a modified FDA-approved test that has been validated and its performance characteristics determined by the reporting laboratory.  This laboratory is certified under the Clinical Laboratory Improvement Amendments CLIA as qualified to perform high complexity clinical laboratory testing.    CEFTRIAXONE  Value in next row Resistant      >=32 RESISTANTThis is a modified FDA-approved test that has been validated and its performance characteristics determined by the reporting laboratory.  This laboratory is  certified under the Clinical Laboratory Improvement Amendments CLIA as qualified to perform high complexity clinical laboratory testing.    CIPROFLOXACIN Value in next row Resistant      >=32 RESISTANTThis is a modified FDA-approved test that has been validated and its performance characteristics determined by the reporting laboratory.  This laboratory is certified under the Clinical Laboratory Improvement Amendments CLIA as qualified to perform high complexity clinical laboratory testing.    GENTAMICIN Value in next row Resistant      >=32 RESISTANTThis is a modified FDA-approved test that has been validated and its performance characteristics determined by the reporting laboratory.  This laboratory is certified under the Clinical Laboratory Improvement Amendments CLIA as qualified to perform high complexity clinical laboratory testing.    NITROFURANTOIN Value in next row Resistant      >=32 RESISTANTThis is a modified FDA-approved test that has been validated and its performance characteristics determined by the reporting laboratory.  This laboratory is certified under the Clinical Laboratory Improvement Amendments CLIA as qualified to perform high complexity clinical laboratory testing.    TRIMETH /SULFA  Value in next row Sensitive      >=32 RESISTANTThis is a modified FDA-approved test that has been validated and its performance characteristics determined by the reporting laboratory.  This laboratory is certified under the Clinical Laboratory Improvement Amendments CLIA as qualified to perform high complexity clinical laboratory testing.    AMPICILLIN/SULBACTAM Value in next row Resistant      >=32 RESISTANTThis is a modified FDA-approved test that has been validated and its performance characteristics determined by the reporting laboratory.  This laboratory is certified under the Clinical Laboratory Improvement Amendments CLIA as qualified to perform high complexity clinical laboratory testing.     PIP/TAZO Value in next row Resistant      >=128 RESISTANTThis is a modified FDA-approved test that has been validated and its performance characteristics determined by the reporting laboratory.  This laboratory is certified under the Clinical Laboratory Improvement Amendments CLIA as qualified to perform high complexity clinical laboratory testing.    MEROPENEM  Value in next row Sensitive      >=128 RESISTANTThis is a modified FDA-approved test that has been validated and its performance characteristics determined by the reporting laboratory.  This laboratory is certified under the Clinical Laboratory Improvement Amendments CLIA as qualified to perform high complexity clinical laboratory testing.    * >=100,000 COLONIES/mL ESCHERICHIA COLI  Resp panel by RT-PCR (RSV, Flu A&B, Covid)     Status: None   Collection Time: 03/14/24  5:44 PM  Result Value Ref Range Status   SARS Coronavirus 2 by RT PCR NEGATIVE NEGATIVE Final    Comment: (NOTE) SARS-CoV-2 target nucleic acids are NOT DETECTED.  The SARS-CoV-2 RNA is generally detectable in upper respiratory specimens during the acute phase of infection. The lowest concentration of SARS-CoV-2 viral copies this assay can detect is 138  copies/mL. A negative result does not preclude SARS-Cov-2 infection and should not be used as the sole basis for treatment or other patient management decisions. A negative result may occur with  improper specimen collection/handling, submission of specimen other than nasopharyngeal swab, presence of viral mutation(s) within the areas targeted by this assay, and inadequate number of viral copies(<138 copies/mL). A negative result must be combined with clinical observations, patient history, and epidemiological information. The expected result is Negative.  Fact Sheet for Patients:  BloggerCourse.com  Fact Sheet for Healthcare Providers:  SeriousBroker.it  This test  is no t yet approved or cleared by the [place]  FDA and  has been authorized for detection and/or diagnosis of SARS-CoV-2 by FDA under an Emergency Use Authorization (EUA). This EUA will remain  in effect (meaning this test can be used) for the duration of the COVID-19 declaration under Section 564(b)(1) of the Act, 21 U.S.C.section 360bbb-3(b)(1), unless the authorization is terminated  or revoked sooner.       Influenza A by PCR NEGATIVE NEGATIVE Final   Influenza B by PCR NEGATIVE NEGATIVE Final    Comment: (NOTE) The Xpert Xpress SARS-CoV-2/FLU/RSV plus assay is intended as an aid in the diagnosis of influenza from Nasopharyngeal swab specimens and should not be used as a sole basis for treatment. Nasal washings and aspirates are unacceptable for Xpert Xpress SARS-CoV-2/FLU/RSV testing.  Fact Sheet for Patients: BloggerCourse.com  Fact Sheet for Healthcare Providers: SeriousBroker.it  This test is not yet approved or cleared by the [place]  FDA and has been authorized for detection and/or diagnosis of SARS-CoV-2 by FDA under an Emergency Use Authorization (EUA). This EUA will remain in effect (meaning this test can be used) for the duration of the COVID-19 declaration under Section 564(b)(1) of the Act, 21 U.S.C. section 360bbb-3(b)(1), unless the authorization is terminated or revoked.     Resp Syncytial Virus by PCR NEGATIVE NEGATIVE Final    Comment: (NOTE) Fact Sheet for Patients: BloggerCourse.com  Fact Sheet for Healthcare Providers: SeriousBroker.it  This test is not yet approved or cleared by the [place]  FDA and has been authorized for detection and/or diagnosis of SARS-CoV-2 by FDA under an Emergency Use Authorization (EUA). This EUA will remain in effect (meaning this test can be used) for the duration of the COVID-19 declaration under Section  564(b)(1) of the Act, 21 U.S.C. section 360bbb-3(b)(1), unless the authorization is terminated or revoked.  Performed at [facility], [address], [place], [place] [address]   MRSA Next Gen by PCR, Nasal     Status: Abnormal   Collection Time: 03/15/24  1:12 AM   Specimen: Nasal Mucosa; Nasal Swab  Result Value Ref Range Status   MRSA by PCR Next Gen DETECTED (A) NOT DETECTED Final    Comment: (NOTE) The GeneXpert MRSA Assay (FDA approved for NASAL specimens only), is one component of a comprehensive MRSA colonization surveillance program. It is not intended to diagnose MRSA infection nor to guide or monitor treatment for MRSA infections. Test performance is not FDA approved in patients less than 84 years old. Performed at [facility], [address] [place], [place] [address]      Labs: BNP (last 3 results) No results for input(s): BNP in the last 8760 hours. Basic Metabolic Panel: Recent Labs  Lab 03/14/24 1508 03/15/24 0313  NA 136 139  K 3.7 3.8  CL 99 104  CO2 24 24  GLUCOSE 214* 111*  BUN 14 17  CREATININE  0.84 0.70  0.68  CALCIUM 10.0 8.8*   Liver Function Tests: Recent Labs  Lab 03/14/24 1508 03/15/24 0313  AST 25 17  ALT 18 11  ALKPHOS 84 71  BILITOT 0.6 0.4  PROT 8.0 5.8*  ALBUMIN 4.6 3.4*   No results for input(s): LIPASE, AMYLASE in the last 168 hours. No results for input(s): AMMONIA in the last 168 hours. CBC: Recent Labs  Lab 03/14/24 1508 03/15/24 0313  WBC 11.7* 10.9*  NEUTROABS 9.5*  --   HGB 15.0 12.9  HCT 44.5 39.3  MCV 89.7 93.6  PLT 255 196   Cardiac Enzymes: No results for input(s): CKTOTAL, CKMB, CKMBINDEX, TROPONINI in the last 168 hours. BNP: Invalid input(s): POCBNP CBG: No results for input(s): GLUCAP in the last 168 hours. D-Dimer No results for input(s): DDIMER in the last 72 hours. Hgb A1c Recent Labs    03/15/24 0313  HGBA1C 5.2   Lipid  Profile No results for input(s): CHOL, HDL, LDLCALC, TRIG, CHOLHDL, LDLDIRECT in the last 72 hours. Thyroid function studies No results for input(s): TSH, T4TOTAL, T3FREE, THYROIDAB in the last 72 hours.  Invalid input(s): FREET3 Anemia work up No results for input(s): VITAMINB12, FOLATE, FERRITIN, TIBC, IRON, RETICCTPCT in the last 72 hours. Urinalysis    Component Value Date/Time   COLORURINE COLORLESS (A) 03/14/2024 1640   APPEARANCEUR CLEAR 03/14/2024 1640   LABSPEC <1.005 (L) 03/14/2024 1640   PHURINE 6.0 03/14/2024 1640   GLUCOSEU NEGATIVE 03/14/2024 1640   HGBUR TRACE (A) 03/14/2024 1640   BILIRUBINUR NEGATIVE 03/14/2024 1640   KETONESUR NEGATIVE 03/14/2024 1640   PROTEINUR NEGATIVE 03/14/2024 1640   UROBILINOGEN 1.0 03/31/2011 2207   NITRITE NEGATIVE 03/14/2024 1640   LEUKOCYTESUR LARGE (A) 03/14/2024 1640   Sepsis Labs Recent Labs  Lab 03/14/24 1508 03/15/24 0313  WBC 11.7* 10.9*   Microbiology Recent Results (from the past 240 hours)  Culture, blood (Routine x 2)     Status: None (Preliminary result)   Collection Time: 03/14/24  3:12 PM   Specimen: BLOOD  Result Value Ref Range Status   Specimen Description   Final    BLOOD LEFT ANTECUBITAL Performed at [facility], [address] [place], [place] [address]    Special Requests   Final    BOTTLES DRAWN AEROBIC AND ANAEROBIC Blood Culture adequate volume Performed at [facility], [address], [place], [place] [address]    Culture   Final    NO GROWTH 3 DAYS Performed at [facility], [address] [place], [place] [address]    Report Status PENDING  Incomplete  Urine Culture     Status: Abnormal   Collection Time: 03/14/24  4:40 PM   Specimen: Urine, Clean Catch  Result Value Ref Range Status   Specimen Description   Final    URINE, CLEAN CATCH Performed at [facility], [address], [place], [place]  [address]    Special Requests   Final    NONE Reflexed from [id number] Performed at [facility], [address], [place], [place] [address]    Culture >=100,000 COLONIES/mL ESCHERICHIA COLI (A)  Final   Report Status 03/16/2024 FINAL  Final   Organism ID, Bacteria ESCHERICHIA COLI (A)  Final      Susceptibility   Escherichia coli - MIC*    AMPICILLIN >=32 RESISTANT Resistant     CEFAZOLIN (URINE) Value in next row Resistant      >=32 RESISTANTThis is a  modified FDA-approved test that has been validated and its performance characteristics determined by the reporting laboratory.  This laboratory is certified under the Clinical Laboratory Improvement Amendments CLIA as qualified to perform high complexity clinical laboratory testing.    CEFEPIME  Value in next row Resistant      >=32 RESISTANTThis is a modified FDA-approved test that has been validated and its performance characteristics determined by the reporting laboratory.  This laboratory is certified under the Clinical Laboratory Improvement Amendments CLIA as qualified to perform high complexity clinical laboratory testing.    ERTAPENEM Value in next row Sensitive      >=32 RESISTANTThis is a modified FDA-approved test that has been validated and its performance characteristics determined by the reporting laboratory.  This laboratory is certified under the Clinical Laboratory Improvement Amendments CLIA as qualified to perform high complexity clinical laboratory testing.    CEFTRIAXONE  Value in next row Resistant      >=32 RESISTANTThis is a modified FDA-approved test that has been validated and its performance characteristics determined by the reporting laboratory.  This laboratory is certified under the Clinical Laboratory Improvement Amendments CLIA as qualified to perform high complexity clinical laboratory testing.    CIPROFLOXACIN Value in next row Resistant      >=32 RESISTANTThis is a modified FDA-approved test that has  been validated and its performance characteristics determined by the reporting laboratory.  This laboratory is certified under the Clinical Laboratory Improvement Amendments CLIA as qualified to perform high complexity clinical laboratory testing.    GENTAMICIN Value in next row Resistant      >=32 RESISTANTThis is a modified FDA-approved test that has been validated and its performance characteristics determined by the reporting laboratory.  This laboratory is certified under the Clinical Laboratory Improvement Amendments CLIA as qualified to perform high complexity clinical laboratory testing.    NITROFURANTOIN Value in next row Resistant      >=32 RESISTANTThis is a modified FDA-approved test that has been validated and its performance characteristics determined by the reporting laboratory.  This laboratory is certified under the Clinical Laboratory Improvement Amendments CLIA as qualified to perform high complexity clinical laboratory testing.    TRIMETH /SULFA  Value in next row Sensitive      >=32 RESISTANTThis is a modified FDA-approved test that has been validated and its performance characteristics determined by the reporting laboratory.  This laboratory is certified under the Clinical Laboratory Improvement Amendments CLIA as qualified to perform high complexity clinical laboratory testing.    AMPICILLIN/SULBACTAM Value in next row Resistant      >=32 RESISTANTThis is a modified FDA-approved test that has been validated and its performance characteristics determined by the reporting laboratory.  This laboratory is certified under the Clinical Laboratory Improvement Amendments CLIA as qualified to perform high complexity clinical laboratory testing.    PIP/TAZO Value in next row Resistant      >=128 RESISTANTThis is a modified FDA-approved test that has been validated and its performance characteristics determined by the reporting laboratory.  This laboratory is certified under the Clinical  Laboratory Improvement Amendments CLIA as qualified to perform high complexity clinical laboratory testing.    MEROPENEM  Value in next row Sensitive      >=128 RESISTANTThis is a modified FDA-approved test that has been validated and its performance characteristics determined by the reporting laboratory.  This laboratory is certified under the Clinical Laboratory Improvement Amendments CLIA as qualified to perform high complexity clinical laboratory testing.    * >=100,000 COLONIES/mL ESCHERICHIA COLI  Resp panel  by RT-PCR (RSV, Flu A&B, Covid)     Status: None   Collection Time: 03/14/24  5:44 PM  Result Value Ref Range Status   SARS Coronavirus 2 by RT PCR NEGATIVE NEGATIVE Final    Comment: (NOTE) SARS-CoV-2 target nucleic acids are NOT DETECTED.  The SARS-CoV-2 RNA is generally detectable in upper respiratory specimens during the acute phase of infection. The lowest concentration of SARS-CoV-2 viral copies this assay can detect is 138 copies/mL. A negative result does not preclude SARS-Cov-2 infection and should not be used as the sole basis for treatment or other patient management decisions. A negative result may occur with  improper specimen collection/handling, submission of specimen other than nasopharyngeal swab, presence of viral mutation(s) within the areas targeted by this assay, and inadequate number of viral copies(<138 copies/mL). A negative result must be combined with clinical observations, patient history, and epidemiological information. The expected result is Negative.  Fact Sheet for Patients:  BloggerCourse.com  Fact Sheet for Healthcare Providers:  SeriousBroker.it  This test is no t yet approved or cleared by the [place]  FDA and  has been authorized for detection and/or diagnosis of SARS-CoV-2 by FDA under an Emergency Use Authorization (EUA). This EUA will remain  in effect (meaning this test can be  used) for the duration of the COVID-19 declaration under Section 564(b)(1) of the Act, 21 U.S.C.section 360bbb-3(b)(1), unless the authorization is terminated  or revoked sooner.       Influenza A by PCR NEGATIVE NEGATIVE Final   Influenza B by PCR NEGATIVE NEGATIVE Final    Comment: (NOTE) The Xpert Xpress SARS-CoV-2/FLU/RSV plus assay is intended as an aid in the diagnosis of influenza from Nasopharyngeal swab specimens and should not be used as a sole basis for treatment. Nasal washings and aspirates are unacceptable for Xpert Xpress SARS-CoV-2/FLU/RSV testing.  Fact Sheet for Patients: BloggerCourse.com  Fact Sheet for Healthcare Providers: SeriousBroker.it  This test is not yet approved or cleared by the [place]  FDA and has been authorized for detection and/or diagnosis of SARS-CoV-2 by FDA under an Emergency Use Authorization (EUA). This EUA will remain in effect (meaning this test can be used) for the duration of the COVID-19 declaration under Section 564(b)(1) of the Act, 21 U.S.C. section 360bbb-3(b)(1), unless the authorization is terminated or revoked.     Resp Syncytial Virus by PCR NEGATIVE NEGATIVE Final    Comment: (NOTE) Fact Sheet for Patients: BloggerCourse.com  Fact Sheet for Healthcare Providers: SeriousBroker.it  This test is not yet approved or cleared by the [place]  FDA and has been authorized for detection and/or diagnosis of SARS-CoV-2 by FDA under an Emergency Use Authorization (EUA). This EUA will remain in effect (meaning this test can be used) for the duration of the COVID-19 declaration under Section 564(b)(1) of the Act, 21 U.S.C. section 360bbb-3(b)(1), unless the authorization is terminated or revoked.  Performed at [facility], [address] [place], [place] [address]   MRSA Next Gen by PCR, Nasal      Status: Abnormal   Collection Time: 03/15/24  1:12 AM   Specimen: Nasal Mucosa; Nasal Swab  Result Value Ref Range Status   MRSA by PCR Next Gen DETECTED (A) NOT DETECTED Final    Comment: (NOTE) The GeneXpert MRSA Assay (FDA approved for NASAL specimens only), is one component of a comprehensive MRSA colonization surveillance program. It is not intended to diagnose MRSA infection nor to guide or monitor treatment for MRSA infections. Test performance is not  FDA approved in patients less than 47 years old. Performed at [facility], [address], [place], [place] [address]     Please note: You were cared for by a hospitalist during your hospital stay. Once you are discharged, your primary care physician will handle any further medical issues. Please note that NO REFILLS for any discharge medications will be authorized once you are discharged, as it is imperative that you return to your primary care physician (or establish a relationship with a primary care physician if you do not have one) for your post hospital discharge needs so that they can reassess your need for medications and monitor your lab values.    Time coordinating discharge: 40 minutes  SIGNED:   Ivonne Mustache, MD  [facility] 03/17/2024, 12:58 PM Pager [phone]  If 7PM-7AM, please contact night-coverage [email or web address] Password [email or web address]

## 2024-03-17 NOTE — Plan of Care (Signed)
 ?  Problem: Clinical Measurements: ?Goal: Ability to maintain clinical measurements within normal limits will improve ?Outcome: Progressing ?Goal: Will remain free from infection ?Outcome: Progressing ?Goal: Diagnostic test results will improve ?Outcome: Progressing ?  ?

## 2024-03-17 NOTE — Progress Notes (Signed)
 Discharge medication delivered to [facility] RN

## 2024-03-17 NOTE — Progress Notes (Signed)
Discharge medications delivered to patient at bedside.

## 2024-03-17 NOTE — Progress Notes (Signed)
 Patient discharged home, IV removed, discharge paperwork provided and explained, patient verbalized understanding.

## 2024-03-17 NOTE — Progress Notes (Signed)
 PROGRESS NOTE  SELDA Klein  FMW:[id number] DOB: 04/25/1976 DOA: 03/14/2024 PCP: [facility]   Brief Narrative: Patient is a 48 year old homeless female with history of ESBL UTI, COPD, substance abuse, seizures, recent admission for sepsis from UTI who presented with fever, chills from home.  On presentation, she was in sinus tachycardia, transiently hypotensive.  Initial plan was to start on vasopressors but blood pressure improved with IV fluid. UA was concerning for  UTI.  Started on empiric antibiotics.  Urine culture showed ESBL E. coli.  Currently on meropenem , plan to complete 5 days course before discharge.  Patient adamant about leaving tomorrow  Assessment & Plan:  Principal Problem:   Sepsis (HCC) Active Problems:   Cocaine abuse (HCC)   COPD (chronic obstructive pulmonary disease) (HCC)   UTI (urinary tract infection)   SIRS (systemic inflammatory response syndrome) (HCC)   Elevated blood pressure reading   Hyperglycemia    ESBL UTI: Presented with fever, chills, leukocytosis.  Lactic acid normal, procalcitonin reassuring.  History of ESBL UTI with sepsis.  Currently on meropenem .  Blood cultures have been negative. Sepsis physiology has improved, currently afebrile. Urine culture showed ESBL E. coli.  Plan for 5 days of meropenem ,day 3/5.  History of COPD/tabacco use: Currently not wheezing.  Continue bronchodilators as needed.  Smokes cigarettes daily, does not count the number.  Currently on nicotine  patch.  History of substance abuse: Cocaine positive in UDS.  Snorts cocaine.  Advised quitting drugs.  TOC consulted  Hypotension: Currently blood pressure stable.  Hyperglycemia: A1c 5.2  Severe protein calorie malnutrition: Nutritionist consulted and following  Deconditioning/debility/homelessness: Lives in a tent.  No problem with ambulation.  TOC consulted   Nutrition Problem: Severe Malnutrition Etiology: chronic illness (COPD; substance abuse)     DVT prophylaxis:enoxaparin  (LOVENOX ) injection 30 mg Start: 03/15/24 1000     Code Status: Full Code  Family Communication: None at the bedside  Patient status: Inpatient  Patient is from : Home  Anticipated discharge to: Home  Estimated DC date: After finishing of meropenem .  Patient says she cannot stay beyond tomorrow so discharge will be  likely tomorrow   Consultants: None  Procedures:None  Antimicrobials:  Anti-infectives (From admission, onward)    Start     Dose/Rate Route Frequency Ordered Stop   03/15/24 0400  meropenem  (MERREM ) 1 g in sodium chloride  0.9 % 100 mL IVPB        1 g 200 mL/hr over 30 Minutes Intravenous Every 8 hours 03/15/24 0210 03/20/24 0359   03/14/24 1545  ceFEPIme  (MAXIPIME ) 2 g in sodium chloride  0.9 % 100 mL IVPB        2 g 200 mL/hr over 30 Minutes Intravenous  Once 03/14/24 1533 03/14/24 1647   03/14/24 1545  metroNIDAZOLE  (FLAGYL ) IVPB 500 mg        500 mg 100 mL/hr over 60 Minutes Intravenous  Once 03/14/24 1533 03/14/24 1715   03/14/24 1545  vancomycin  (VANCOCIN ) IVPB 1000 mg/200 mL premix        1,000 mg 200 mL/hr over 60 Minutes Intravenous  Once 03/14/24 1533 03/14/24 1830       Subjective: Patient seen and examined at bedside today.  Hemodynamically stable.  Overall comfortable.  Lying in bed.  No new complaints.  She says she has to leave by tomorrow.  Today is day 3/5 of meropenem   Objective: Vitals:   03/16/24 1339 03/16/24 1523 03/16/24 1945 03/17/24 0625  BP: 100/76 107/84 110/80 100/79  Pulse: ROLLEN)  101 (!) 106 (!) 105 100  Resp: 18  17 18   Temp: 98.6 F (37 C) 98.4 F (36.9 C) 97.7 F (36.5 C) 97.8 F (36.6 C)  TempSrc: Oral Oral    SpO2: 94% 91% 95% 92%  Weight:      Height:        Intake/Output Summary (Last 24 hours) at 03/17/2024 1049 Last data filed at 03/17/2024 1020 Gross per 24 hour  Intake 910 ml  Output --  Net 910 ml   Filed Weights   03/15/24 0108  Weight: 40.6 kg     Examination:  General exam: Overall comfortable, not in distress HEENT: PERRL Respiratory system:  no wheezes or crackles  Cardiovascular system: S1 & S2 heard, RRR.  Gastrointestinal system: Abdomen is nondistended, soft and nontender. Central nervous system: Alert and oriented Extremities: No edema, no clubbing ,no cyanosis Skin: No rashes, no ulcers,no icterus     Data Reviewed: I have personally reviewed following labs and imaging studies  CBC: Recent Labs  Lab 03/14/24 1508 03/15/24 0313  WBC 11.7* 10.9*  NEUTROABS 9.5*  --   HGB 15.0 12.9  HCT 44.5 39.3  MCV 89.7 93.6  PLT 255 196   Basic Metabolic Panel: Recent Labs  Lab 03/14/24 1508 03/15/24 0313  NA 136 139  K 3.7 3.8  CL 99 104  CO2 24 24  GLUCOSE 214* 111*  BUN 14 17  CREATININE 0.84 0.70  0.68  CALCIUM 10.0 8.8*     Recent Results (from the past 240 hours)  Culture, blood (Routine x 2)     Status: None (Preliminary result)   Collection Time: 03/14/24  3:12 PM   Specimen: BLOOD  Result Value Ref Range Status   Specimen Description   Final    BLOOD LEFT ANTECUBITAL Performed at [facility], [address] [place], [place] [address]    Special Requests   Final    BOTTLES DRAWN AEROBIC AND ANAEROBIC Blood Culture adequate volume Performed at [facility], [address], [place], [place] [address]    Culture   Final    NO GROWTH 3 DAYS Performed at [facility], [address] [place], [place] [address]    Report Status PENDING  Incomplete  Urine Culture     Status: Abnormal   Collection Time: 03/14/24  4:40 PM   Specimen: Urine, Clean Catch  Result Value Ref Range Status   Specimen Description   Final    URINE, CLEAN CATCH Performed at [facility], [address], [place], [place] [address]    Special Requests   Final    NONE Reflexed from [id number] Performed at [facility], [address], [place],  [place] [address]    Culture >=100,000 COLONIES/mL ESCHERICHIA COLI (A)  Final   Report Status 03/16/2024 FINAL  Final   Organism ID, Bacteria ESCHERICHIA COLI (A)  Final      Susceptibility   Escherichia coli - MIC*    AMPICILLIN >=32 RESISTANT Resistant     CEFAZOLIN (URINE) Value in next row Resistant      >=32 RESISTANTThis is a modified FDA-approved test that has been validated and its performance characteristics determined by the reporting laboratory.  This laboratory is certified under the Clinical Laboratory Improvement Amendments CLIA as qualified to perform high complexity clinical laboratory testing.    CEFEPIME  Value in next row Resistant      >=32 RESISTANTThis is a modified FDA-approved test that has  been validated and its performance characteristics determined by the reporting laboratory.  This laboratory is certified under the Clinical Laboratory Improvement Amendments CLIA as qualified to perform high complexity clinical laboratory testing.    ERTAPENEM Value in next row Sensitive      >=32 RESISTANTThis is a modified FDA-approved test that has been validated and its performance characteristics determined by the reporting laboratory.  This laboratory is certified under the Clinical Laboratory Improvement Amendments CLIA as qualified to perform high complexity clinical laboratory testing.    CEFTRIAXONE  Value in next row Resistant      >=32 RESISTANTThis is a modified FDA-approved test that has been validated and its performance characteristics determined by the reporting laboratory.  This laboratory is certified under the Clinical Laboratory Improvement Amendments CLIA as qualified to perform high complexity clinical laboratory testing.    CIPROFLOXACIN Value in next row Resistant      >=32 RESISTANTThis is a modified FDA-approved test that has been validated and its performance characteristics determined by the reporting laboratory.  This laboratory is certified under the Clinical  Laboratory Improvement Amendments CLIA as qualified to perform high complexity clinical laboratory testing.    GENTAMICIN Value in next row Resistant      >=32 RESISTANTThis is a modified FDA-approved test that has been validated and its performance characteristics determined by the reporting laboratory.  This laboratory is certified under the Clinical Laboratory Improvement Amendments CLIA as qualified to perform high complexity clinical laboratory testing.    NITROFURANTOIN Value in next row Resistant      >=32 RESISTANTThis is a modified FDA-approved test that has been validated and its performance characteristics determined by the reporting laboratory.  This laboratory is certified under the Clinical Laboratory Improvement Amendments CLIA as qualified to perform high complexity clinical laboratory testing.    TRIMETH /SULFA  Value in next row Sensitive      >=32 RESISTANTThis is a modified FDA-approved test that has been validated and its performance characteristics determined by the reporting laboratory.  This laboratory is certified under the Clinical Laboratory Improvement Amendments CLIA as qualified to perform high complexity clinical laboratory testing.    AMPICILLIN/SULBACTAM Value in next row Resistant      >=32 RESISTANTThis is a modified FDA-approved test that has been validated and its performance characteristics determined by the reporting laboratory.  This laboratory is certified under the Clinical Laboratory Improvement Amendments CLIA as qualified to perform high complexity clinical laboratory testing.    PIP/TAZO Value in next row Resistant      >=128 RESISTANTThis is a modified FDA-approved test that has been validated and its performance characteristics determined by the reporting laboratory.  This laboratory is certified under the Clinical Laboratory Improvement Amendments CLIA as qualified to perform high complexity clinical laboratory testing.    MEROPENEM  Value in next row  Sensitive      >=128 RESISTANTThis is a modified FDA-approved test that has been validated and its performance characteristics determined by the reporting laboratory.  This laboratory is certified under the Clinical Laboratory Improvement Amendments CLIA as qualified to perform high complexity clinical laboratory testing.    * >=100,000 COLONIES/mL ESCHERICHIA COLI  Resp panel by RT-PCR (RSV, Flu A&B, Covid)     Status: None   Collection Time: 03/14/24  5:44 PM  Result Value Ref Range Status   SARS Coronavirus 2 by RT PCR NEGATIVE NEGATIVE Final    Comment: (NOTE) SARS-CoV-2 target nucleic acids are NOT DETECTED.  The SARS-CoV-2 RNA is generally detectable in upper respiratory specimens  during the acute phase of infection. The lowest concentration of SARS-CoV-2 viral copies this assay can detect is 138 copies/mL. A negative result does not preclude SARS-Cov-2 infection and should not be used as the sole basis for treatment or other patient management decisions. A negative result may occur with  improper specimen collection/handling, submission of specimen other than nasopharyngeal swab, presence of viral mutation(s) within the areas targeted by this assay, and inadequate number of viral copies(<138 copies/mL). A negative result must be combined with clinical observations, patient history, and epidemiological information. The expected result is Negative.  Fact Sheet for Patients:  BloggerCourse.com  Fact Sheet for Healthcare Providers:  SeriousBroker.it  This test is no t yet approved or cleared by the [place]  FDA and  has been authorized for detection and/or diagnosis of SARS-CoV-2 by FDA under an Emergency Use Authorization (EUA). This EUA will remain  in effect (meaning this test can be used) for the duration of the COVID-19 declaration under Section 564(b)(1) of the Act, 21 U.S.C.section 360bbb-3(b)(1), unless the  authorization is terminated  or revoked sooner.       Influenza A by PCR NEGATIVE NEGATIVE Final   Influenza B by PCR NEGATIVE NEGATIVE Final    Comment: (NOTE) The Xpert Xpress SARS-CoV-2/FLU/RSV plus assay is intended as an aid in the diagnosis of influenza from Nasopharyngeal swab specimens and should not be used as a sole basis for treatment. Nasal washings and aspirates are unacceptable for Xpert Xpress SARS-CoV-2/FLU/RSV testing.  Fact Sheet for Patients: BloggerCourse.com  Fact Sheet for Healthcare Providers: SeriousBroker.it  This test is not yet approved or cleared by the [place]  FDA and has been authorized for detection and/or diagnosis of SARS-CoV-2 by FDA under an Emergency Use Authorization (EUA). This EUA will remain in effect (meaning this test can be used) for the duration of the COVID-19 declaration under Section 564(b)(1) of the Act, 21 U.S.C. section 360bbb-3(b)(1), unless the authorization is terminated or revoked.     Resp Syncytial Virus by PCR NEGATIVE NEGATIVE Final    Comment: (NOTE) Fact Sheet for Patients: BloggerCourse.com  Fact Sheet for Healthcare Providers: SeriousBroker.it  This test is not yet approved or cleared by the [place]  FDA and has been authorized for detection and/or diagnosis of SARS-CoV-2 by FDA under an Emergency Use Authorization (EUA). This EUA will remain in effect (meaning this test can be used) for the duration of the COVID-19 declaration under Section 564(b)(1) of the Act, 21 U.S.C. section 360bbb-3(b)(1), unless the authorization is terminated or revoked.  Performed at [facility], [address] [place], [place] [address]   MRSA Next Gen by PCR, Nasal     Status: Abnormal   Collection Time: 03/15/24  1:12 AM   Specimen: Nasal Mucosa; Nasal Swab  Result Value Ref Range Status   MRSA  by PCR Next Gen DETECTED (A) NOT DETECTED Final    Comment: (NOTE) The GeneXpert MRSA Assay (FDA approved for NASAL specimens only), is one component of a comprehensive MRSA colonization surveillance program. It is not intended to diagnose MRSA infection nor to guide or monitor treatment for MRSA infections. Test performance is not FDA approved in patients less than 37 years old. Performed at [facility], [address] [place], [place] [address]      Radiology Studies: No results found.   Scheduled Meds:  Chlorhexidine  Gluconate Cloth  6 each Topical Daily   enoxaparin  (LOVENOX ) injection  30 mg Subcutaneous Q24H   feeding supplement  237 mL Oral TID BM   multivitamin with minerals  1 tablet Oral Daily   mupirocin  ointment  1 Application Nasal BID   nicotine   14 mg Transdermal Daily   Continuous Infusions:  meropenem  (MERREM ) IV 1 g (03/17/24 1018)     LOS: 2 days   Ivonne Mustache, MD [facility] P9/25/2025, 10:49 AM

## 2024-03-19 LAB — CULTURE, BLOOD (ROUTINE X 2)
Culture: NO GROWTH
Special Requests: ADEQUATE

## 2024-07-14 ENCOUNTER — Other Ambulatory Visit: Payer: Self-pay | Admitting: Adult Medicine

## 2024-07-14 DIAGNOSIS — Z1231 Encounter for screening mammogram for malignant neoplasm of breast: Secondary | ICD-10-CM
